# Patient Record
Sex: Female | Born: 1955
Health system: Southern US, Community
[De-identification: ages and names within clinical notes are randomized; demographics above are authoritative.]

## PROBLEM LIST (undated history)

## (undated) DIAGNOSIS — K5792 Diverticulitis of intestine, part unspecified, without perforation or abscess without bleeding: Secondary | ICD-10-CM

## (undated) DIAGNOSIS — R112 Nausea with vomiting, unspecified: Secondary | ICD-10-CM

## (undated) DIAGNOSIS — D696 Thrombocytopenia, unspecified: Secondary | ICD-10-CM

## (undated) DIAGNOSIS — M1990 Unspecified osteoarthritis, unspecified site: Secondary | ICD-10-CM

## (undated) DIAGNOSIS — Z9889 Other specified postprocedural states: Secondary | ICD-10-CM

## (undated) HISTORY — PX: TUBAL LIGATION: SHX77

## (undated) HISTORY — DX: Diverticulitis of intestine, part unspecified, without perforation or abscess without bleeding: K57.92

---

## 2007-02-10 ENCOUNTER — Ambulatory Visit: Payer: Self-pay

## 2007-11-11 ENCOUNTER — Ambulatory Visit: Payer: Self-pay | Admitting: Gastroenterology

## 2008-03-22 ENCOUNTER — Ambulatory Visit: Payer: Self-pay | Admitting: Obstetrics and Gynecology

## 2009-03-29 ENCOUNTER — Ambulatory Visit: Payer: Self-pay | Admitting: Obstetrics and Gynecology

## 2009-04-06 ENCOUNTER — Ambulatory Visit: Payer: Self-pay | Admitting: Obstetrics and Gynecology

## 2010-07-02 ENCOUNTER — Ambulatory Visit: Payer: Self-pay | Admitting: Obstetrics and Gynecology

## 2010-07-17 ENCOUNTER — Ambulatory Visit: Payer: Self-pay | Admitting: Obstetrics and Gynecology

## 2011-07-23 ENCOUNTER — Ambulatory Visit: Payer: Self-pay | Admitting: Obstetrics and Gynecology

## 2012-01-23 ENCOUNTER — Encounter: Payer: Self-pay | Admitting: Orthopedic Surgery

## 2012-02-17 ENCOUNTER — Encounter: Payer: Self-pay | Admitting: Orthopedic Surgery

## 2012-03-19 ENCOUNTER — Encounter: Payer: Self-pay | Admitting: Orthopedic Surgery

## 2012-07-28 ENCOUNTER — Ambulatory Visit: Payer: Self-pay | Admitting: Obstetrics and Gynecology

## 2012-08-07 ENCOUNTER — Ambulatory Visit: Payer: Self-pay | Admitting: Internal Medicine

## 2012-09-01 ENCOUNTER — Ambulatory Visit: Payer: Self-pay | Admitting: Internal Medicine

## 2012-09-01 LAB — RETICULOCYTES
Absolute Retic Count: 0.0517 10*6/uL (ref 0.023–0.096)
Reticulocyte: 1.2 % (ref 0.5–2.2)

## 2012-09-01 LAB — CBC CANCER CENTER
Comment - H1-Com2: NORMAL
HGB: 13.6 g/dL (ref 12.0–16.0)
MCH: 31.6 pg (ref 26.0–34.0)
Monocytes: 7 %
Platelet: 159 x10 3/mm (ref 150–440)
RBC: 4.32 10*6/uL (ref 3.80–5.20)
RDW: 12.8 % (ref 11.5–14.5)

## 2012-09-01 LAB — IRON AND TIBC
Iron Saturation: 38 %
Iron: 114 ug/dL (ref 50–170)
Unbound Iron-Bind.Cap.: 184 ug/dL

## 2012-09-01 LAB — FIBRINOGEN: Fibrinogen: 220 mg/dL (ref 210–470)

## 2012-09-01 LAB — PROTIME-INR
INR: 0.9
Prothrombin Time: 12.3 secs (ref 11.5–14.7)

## 2012-09-01 LAB — APTT: Activated PTT: 35.8 secs (ref 23.6–35.9)

## 2012-09-01 LAB — FOLATE: Folic Acid: 17.6 ng/mL (ref 3.1–100.0)

## 2012-09-01 LAB — FIBRIN DEGRADATION PROD.(ARMC ONLY): Fibrin Degradation Prod.: <10 ug/ml (ref 2.1–7.7)

## 2012-09-01 LAB — LACTATE DEHYDROGENASE: LDH: 186 U/L (ref 81–246)

## 2012-09-03 LAB — PROT IMMUNOELECTROPHORES(ARMC)

## 2012-09-16 LAB — CREATININE, SERUM
Creatinine: 0.71 mg/dL (ref 0.60–1.30)
EGFR (African American): >60

## 2012-09-19 ENCOUNTER — Ambulatory Visit: Payer: Self-pay | Admitting: Internal Medicine

## 2012-10-17 ENCOUNTER — Ambulatory Visit: Payer: Self-pay | Admitting: Internal Medicine

## 2013-04-29 DIAGNOSIS — C4491 Basal cell carcinoma of skin, unspecified: Secondary | ICD-10-CM

## 2013-04-29 HISTORY — DX: Basal cell carcinoma of skin, unspecified: C44.91

## 2013-10-28 ENCOUNTER — Ambulatory Visit: Payer: Self-pay | Admitting: Obstetrics and Gynecology

## 2013-12-09 ENCOUNTER — Ambulatory Visit: Payer: Self-pay | Admitting: Obstetrics and Gynecology

## 2014-11-23 ENCOUNTER — Encounter: Payer: Self-pay | Admitting: *Deleted

## 2015-02-08 ENCOUNTER — Other Ambulatory Visit: Payer: Self-pay | Admitting: Obstetrics and Gynecology

## 2015-02-08 DIAGNOSIS — Z1231 Encounter for screening mammogram for malignant neoplasm of breast: Secondary | ICD-10-CM

## 2015-02-27 ENCOUNTER — Other Ambulatory Visit: Payer: Self-pay | Admitting: Obstetrics and Gynecology

## 2015-02-27 ENCOUNTER — Ambulatory Visit
Admission: RE | Admit: 2015-02-27 | Discharge: 2015-02-27 | Disposition: A | Discharge status: Home / Self Care | Payer: 59 | Source: Ambulatory Visit | Attending: Obstetrics and Gynecology | Admitting: Obstetrics and Gynecology

## 2015-02-27 DIAGNOSIS — Z1231 Encounter for screening mammogram for malignant neoplasm of breast: Secondary | ICD-10-CM | POA: Insufficient documentation

## 2015-03-29 ENCOUNTER — Ambulatory Visit: Payer: 59 | Admitting: Internal Medicine

## 2015-08-20 DIAGNOSIS — K5792 Diverticulitis of intestine, part unspecified, without perforation or abscess without bleeding: Secondary | ICD-10-CM

## 2015-08-20 HISTORY — DX: Diverticulitis of intestine, part unspecified, without perforation or abscess without bleeding: K57.92

## 2015-09-20 ENCOUNTER — Ambulatory Visit: Payer: Self-pay | Admitting: Physician Assistant

## 2015-09-20 ENCOUNTER — Encounter: Payer: Self-pay | Admitting: Physician Assistant

## 2015-09-20 VITALS — BP 130/90 | HR 80 | Temp 98.4°F

## 2015-09-20 DIAGNOSIS — R110 Nausea: Secondary | ICD-10-CM

## 2015-09-20 DIAGNOSIS — R197 Diarrhea, unspecified: Secondary | ICD-10-CM

## 2015-09-20 DIAGNOSIS — N3000 Acute cystitis without hematuria: Secondary | ICD-10-CM

## 2015-09-20 DIAGNOSIS — R300 Dysuria: Secondary | ICD-10-CM

## 2015-09-20 LAB — POCT URINALYSIS DIPSTICK
GLUCOSE UA: NEGATIVE
Nitrite, UA: NEGATIVE
RBC UA: NEGATIVE
Urobilinogen, UA: 1
pH, UA: 5.5

## 2015-09-20 MED ORDER — ONDANSETRON HCL 4 MG PO TABS: 4.0000 mg | ORAL_TABLET | Freq: Three times a day (TID) | ORAL | Status: DC | PRN

## 2015-09-20 MED ORDER — CIPROFLOXACIN HCL 500 MG PO TABS: 500.0000 mg | ORAL_TABLET | Freq: Two times a day (BID) | ORAL | Status: DC

## 2015-09-20 NOTE — Progress Notes (Signed)
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

## 2015-09-22 ENCOUNTER — Telehealth: Payer: Self-pay | Admitting: Physician Assistant

## 2015-09-22 ENCOUNTER — Encounter: Payer: Self-pay | Admitting: Physician Assistant

## 2015-09-22 ENCOUNTER — Ambulatory Visit: Payer: Self-pay | Admitting: Physician Assistant

## 2015-09-22 VITALS — BP 110/70 | HR 84 | Temp 98.0°F

## 2015-09-22 DIAGNOSIS — L509 Urticaria, unspecified: Secondary | ICD-10-CM

## 2015-09-22 MED ORDER — METHYLPREDNISOLONE 4 MG PO TBPK: ORAL_TABLET | ORAL | Status: DC

## 2015-09-22 NOTE — Telephone Encounter (Signed)
Tell her I would wait a few days, it may take a week for them to clear up

## 2015-09-22 NOTE — Progress Notes (Signed)
S: hives are getting worse, abd pain and uti sx better, areas are itchy, using benadryl without relief  O: vitals wnl, nad, skin with increasing hives on legs, lungs c t a, cv rrr  A: hives  P: medrol dose pack

## 2015-09-22 NOTE — Telephone Encounter (Signed)
Patient returned call scheduled appt to be seen

## 2015-09-29 ENCOUNTER — Ambulatory Visit: Payer: Self-pay | Admitting: Physician Assistant

## 2015-09-29 ENCOUNTER — Encounter: Payer: Self-pay | Admitting: Physician Assistant

## 2015-09-29 VITALS — BP 102/74 | HR 80 | Temp 98.3°F

## 2015-09-29 DIAGNOSIS — N390 Urinary tract infection, site not specified: Secondary | ICD-10-CM

## 2015-09-29 DIAGNOSIS — R300 Dysuria: Secondary | ICD-10-CM

## 2015-09-29 LAB — POCT URINALYSIS DIPSTICK
BILIRUBIN UA: NEGATIVE
GLUCOSE UA: NEGATIVE
Ketones, UA: NEGATIVE
Nitrite, UA: NEGATIVE
Protein, UA: NEGATIVE
RBC UA: NEGATIVE
SPEC GRAV UA: 1.015
Urobilinogen, UA: 0.2
pH, UA: 6

## 2015-09-29 MED ORDER — SULFAMETHOXAZOLE-TRIMETHOPRIM 800-160 MG PO TABS
1.0000 | ORAL_TABLET | Freq: Two times a day (BID) | ORAL | Status: DC
Start: 2015-09-29 — End: 2015-10-05

## 2015-09-29 NOTE — Progress Notes (Signed)
S: c/o lbp, chills, body aches, ?if uti, no abd pain or vag d/c, just had uti treated with cipro  O: vitals wnl, nad, no cva tenderness, ua +1 leuks  A: uti  P: septra ds 1 po bid x 7d

## 2015-10-01 ENCOUNTER — Inpatient Hospital Stay
Admission: EM | Admit: 2015-10-01 | Discharge: 2015-10-05 | DRG: 392 | Disposition: A | Discharge status: Home / Self Care | Payer: 59 | Attending: Surgery | Admitting: Surgery

## 2015-10-01 ENCOUNTER — Emergency Department: Payer: 59

## 2015-10-01 ENCOUNTER — Encounter: Payer: Self-pay | Admitting: Radiology

## 2015-10-01 DIAGNOSIS — Z803 Family history of malignant neoplasm of breast: Secondary | ICD-10-CM | POA: Diagnosis not present

## 2015-10-01 DIAGNOSIS — K5720 Diverticulitis of large intestine with perforation and abscess without bleeding: Secondary | ICD-10-CM | POA: Diagnosis not present

## 2015-10-01 DIAGNOSIS — R109 Unspecified abdominal pain: Secondary | ICD-10-CM | POA: Diagnosis not present

## 2015-10-01 DIAGNOSIS — R1030 Lower abdominal pain, unspecified: Secondary | ICD-10-CM | POA: Diagnosis not present

## 2015-10-01 DIAGNOSIS — K5732 Diverticulitis of large intestine without perforation or abscess without bleeding: Secondary | ICD-10-CM | POA: Diagnosis present

## 2015-10-01 LAB — CBC
HEMATOCRIT: 40.5 % (ref 35.0–47.0)
Hemoglobin: 13.5 g/dL (ref 12.0–16.0)
MCH: 30.5 pg (ref 26.0–34.0)
MCHC: 33.4 g/dL (ref 32.0–36.0)
MCV: 91.4 fL (ref 80.0–100.0)
PLATELETS: 170 10*3/uL (ref 150–440)
RBC: 4.43 MIL/uL (ref 3.80–5.20)
RDW: 12.6 % (ref 11.5–14.5)
WBC: 8.6 10*3/uL (ref 3.6–11.0)

## 2015-10-01 LAB — URINALYSIS COMPLETE WITH MICROSCOPIC (ARMC ONLY)
BILIRUBIN URINE: NEGATIVE
GLUCOSE, UA: NEGATIVE mg/dL
Hgb urine dipstick: NEGATIVE
NITRITE: NEGATIVE
PROTEIN: NEGATIVE mg/dL
SPECIFIC GRAVITY, URINE: 1.024 (ref 1.005–1.030)
pH: 6 (ref 5.0–8.0)

## 2015-10-01 LAB — COMPREHENSIVE METABOLIC PANEL
ALBUMIN: 4 g/dL (ref 3.5–5.0)
ALK PHOS: 61 U/L (ref 38–126)
ALT: 11 U/L — AB (ref 14–54)
AST: 16 U/L (ref 15–41)
Anion gap: 8 (ref 5–15)
BILIRUBIN TOTAL: 0.7 mg/dL (ref 0.3–1.2)
BUN: 18 mg/dL (ref 6–20)
CALCIUM: 8.8 mg/dL — AB (ref 8.9–10.3)
CO2: 27 mmol/L (ref 22–32)
CREATININE: 0.76 mg/dL (ref 0.44–1.00)
Chloride: 105 mmol/L (ref 101–111)
GFR calc Af Amer: >60 mL/min (ref >60)
GLUCOSE: 104 mg/dL — AB (ref 65–99)
Potassium: 4 mmol/L (ref 3.5–5.1)
Sodium: 140 mmol/L (ref 135–145)
TOTAL PROTEIN: 7.4 g/dL (ref 6.5–8.1)

## 2015-10-01 LAB — LIPASE, BLOOD: LIPASE: 28 U/L (ref 11–51)

## 2015-10-01 MED ORDER — ERTAPENEM SODIUM 1 G IJ SOLR
1.0000 g | INTRAMUSCULAR | Status: DC
Start: 2015-10-01 — End: 2015-10-05
  Administered 2015-10-01 – 2015-10-04 (×4): 1 g via INTRAVENOUS
  Filled 2015-10-01 (×5): qty 1

## 2015-10-01 MED ORDER — IOHEXOL 300 MG/ML  SOLN
100.0000 mL | Freq: Once | INTRAMUSCULAR | Status: AC | PRN
  Administered 2015-10-01: 100 mL via INTRAVENOUS

## 2015-10-01 MED ORDER — KCL IN DEXTROSE-NACL 20-5-0.45 MEQ/L-%-% IV SOLN
INTRAVENOUS | Status: DC
  Administered 2015-10-01 – 2015-10-04 (×5): via INTRAVENOUS
  Filled 2015-10-01 (×10): qty 1000

## 2015-10-01 MED ORDER — PANTOPRAZOLE SODIUM 40 MG PO TBEC
40.0000 mg | DELAYED_RELEASE_TABLET | Freq: Two times a day (BID) | ORAL | Status: DC
  Administered 2015-10-01 – 2015-10-05 (×8): 40 mg via ORAL
  Filled 2015-10-01 (×8): qty 1

## 2015-10-01 MED ORDER — ENOXAPARIN SODIUM 40 MG/0.4ML ~~LOC~~ SOLN
40.0000 mg | SUBCUTANEOUS | Status: DC
  Administered 2015-10-01 – 2015-10-04 (×4): 40 mg via SUBCUTANEOUS
  Filled 2015-10-01 (×4): qty 0.4

## 2015-10-01 MED ORDER — ACETAMINOPHEN 650 MG RE SUPP: 650.0000 mg | Freq: Four times a day (QID) | RECTAL | Status: DC | PRN

## 2015-10-01 MED ORDER — IOHEXOL 240 MG/ML SOLN
25.0000 mL | Freq: Once | INTRAMUSCULAR | Status: AC | PRN
  Administered 2015-10-01: 25 mL via ORAL

## 2015-10-01 MED ORDER — ACETAMINOPHEN 325 MG PO TABS
650.0000 mg | ORAL_TABLET | Freq: Four times a day (QID) | ORAL | Status: DC | PRN
  Administered 2015-10-02 – 2015-10-04 (×3): 650 mg via ORAL
  Filled 2015-10-01 (×4): qty 2

## 2015-10-01 MED ORDER — ONDANSETRON HCL 4 MG/2ML IJ SOLN
4.0000 mg | Freq: Four times a day (QID) | INTRAMUSCULAR | Status: DC | PRN
  Administered 2015-10-02: 4 mg via INTRAVENOUS
  Filled 2015-10-01: qty 2

## 2015-10-01 MED ORDER — CONJ ESTROG-MEDROXYPROGEST ACE 0.45-1.5 MG PO TABS
1.0000 | ORAL_TABLET | Freq: Every day | ORAL | Status: DC
  Administered 2015-10-03 – 2015-10-05 (×3): 1 via ORAL
  Filled 2015-10-01 (×4): qty 1

## 2015-10-01 MED ORDER — HYDROMORPHONE HCL 1 MG/ML IJ SOLN
1.0000 mg | INTRAMUSCULAR | Status: DC | PRN
  Administered 2015-10-01: 1 mg via INTRAVENOUS
  Filled 2015-10-01: qty 1

## 2015-10-01 MED ORDER — ONDANSETRON 8 MG PO TBDP: 4.0000 mg | ORAL_TABLET | Freq: Four times a day (QID) | ORAL | Status: DC | PRN

## 2015-10-01 MED ORDER — HYDROCODONE-ACETAMINOPHEN 5-325 MG PO TABS: 1.0000 | ORAL_TABLET | ORAL | Status: DC | PRN

## 2015-10-01 NOTE — ED Notes (Signed)
Report given to Rebecca, RN

## 2015-10-01 NOTE — H&P (Signed)
Rebecca Watkins is a 60 y.o. female  with several week history of low back and abdominal pain.  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 She had some urinary symptoms was seen in the urgent care center at the hospital and was treated for urinary tract infection. She really did not improve. She developed a reaction with skin whelps to her antibiotic therapy and was placed on a course of steroids. She has just completed the steroids. She has had significant low back pain feeling poorly with liquid stools. She denies any fever or chills. Because of her persistent symptoms she presented to the emergency room for further evaluation.  She denies any history of significant GI problems in the past. Specifically, she denies any history of hepatitis, yellow jaundice, pancreatitis, peptic ulcer disease, gallbladder disease, or diverticulitis. She has had no surgery in the past. She does not have a primary care physician. Of note is the fact that she was diagnosed with thrombocytopenia and leukopenia several years ago evaluated the cancer center. She was under no specific therapy in the problem appears to have resolved. Her current platelet count is 170,000 white blood cell count is 8600.  Her last colonoscopy was in 2009. I do not have a copy of that report but she says it was unremarkable with no evidence of any GI problems. She denies any bleeding or change in stool habits prior to this particular episode.  CT scan demonstrated a complex multiloculated mass in the pelvis between the sigmoid colon and the uterus. It does appear to be separate from the ovaries which appeared distinct from this mass. Working diagnosis would be a uterine problem versus perforated diverticulitis. The surgical service was consulted.  History reviewed. No pertinent past medical history. History reviewed. No pertinent past surgical history. Social  History   Social History  . Marital Status: Married    Spouse Name: N/A  . Number of Children: N/A  . Years of Education: N/A   Social History Main Topics  . Smoking status: Never Smoker   . Smokeless tobacco: None  . Alcohol Use: None  . Drug Use: None  . Sexual Activity: Not Asked   Other Topics Concern  . None   Social History Narrative     Review of Systems  Constitutional: Positive for chills and malaise/fatigue. Negative for fever.  HENT: Negative.   Eyes: Negative.   Respiratory: Negative for cough, shortness of breath and wheezing.   Cardiovascular: Negative for chest pain and palpitations.  Gastrointestinal: Positive for nausea, abdominal pain and diarrhea. Negative for heartburn, vomiting and blood in stool.  Genitourinary: Positive for dysuria and urgency. Negative for flank pain.  Musculoskeletal: Positive for back pain.  Skin: Positive for itching and rash.  Neurological: Negative.   Psychiatric/Behavioral: Negative.      PHYSICAL EXAM: BP 102/47 mmHg  Pulse 94  Temp(Src) 97.4 F (36.3 C) (Oral)  Resp 16  Ht 5\' 7"  (1.702 m)  Wt 65.772 kg (145 lb)  BMI 22.71 kg/m2  SpO2 98%  Physical Exam  Constitutional: She is oriented to person, place, and time. She appears well-developed and well-nourished. No distress.  HENT:  Head: Normocephalic and atraumatic.  Eyes: EOM are normal. Pupils are equal, round, and reactive to light.  Neck: Normal range of motion. Neck supple.  Cardiovascular: Normal rate, regular rhythm and normal heart sounds.   Pulmonary/Chest: Effort normal and breath sounds  normal.  Abdominal: Soft. Bowel sounds are normal. She exhibits no distension. There is tenderness.  Musculoskeletal: She exhibits no edema or tenderness.  Neurological: She is alert and oriented to person, place, and time.  Skin: Skin is warm and dry.  Psychiatric: She has a normal mood and affect. Thought content normal.   Her abdomen is soft with minimal abdominal  tenderness no rebound and no guarding. She does have active bowel sounds.  Impression/Plan: Our working diagnosis here would be a pelvic abscess most likely secondary to perforated diverticulitis. We will plan to admit her to the hospital place her on IV antibiotics. She's been on antibiotics for a presumed urinary tract infection so I'm going to treat her with Invanz rather than Zosyn. She did have a significant allergic reaction to Cipro and she does have an allergy to erythromycin. I discussed the possibility of surgical intervention including the possibility of a temporary colostomy. She is in agreement with this plan. Her husband was present for the interview.   Dia Brousseau III, MD  10/01/2015, 9:01 PM

## 2015-10-01 NOTE — ED Notes (Signed)
Pt states that she was seen [facility] and treated for uti with lower abd and back pain since January, pt states that she was placed on cipro and hives got worse, pt stopped with cipro and started septra, pt is very nauseated with the septra, abd pain, and back pain, no formed stools, is filled with a lot of mucous. Pt has abd cramping when she has to void

## 2015-10-01 NOTE — ED Notes (Signed)
Patient transported to room 219 

## 2015-10-01 NOTE — ED Provider Notes (Signed)
[facility] Emergency Department Provider Note   ____________________________________________  Time seen: ~1805  I have reviewed the triage vital signs and the nursing notes.   HISTORY  Chief Complaint Abdominal Pain   History limited by: Not Limited   HPI Rebecca Watkins is a 60 y.o. female with no significant past medical history presents to the emergency department today because of concerns for feeling unwell, low back pain. Patient states that the symptoms have been going on for the past 2 weeks. 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 She has tried 2 different antibiotics for without any relief of her symptoms. Associated symptoms of diarrhea and mucosal bowel movements. She has had one episode of vomiting. Low-grade fevers. Patient states that the pain is located in her bilateral lower back. It is constant. She does take ibuprofen which gives a small amount of temporary relief. Denies similar symptoms in the past.     No past medical history on file.  There are no active problems to display for this patient.   No past surgical history on file.  Current Outpatient Rx  Name  Route  Sig  Dispense  Refill  . ciprofloxacin (CIPRO) 500 MG tablet   Oral   Take 1 tablet (500 mg total) by mouth 2 (two) times daily. Patient not taking: Reported on 09/22/2015   14 tablet   0   . methylPREDNISolone (MEDROL DOSEPAK) 4 MG TBPK tablet      Take 6 pills on day one then decrease by 1 pill each day Patient not taking: Reported on 09/29/2015   21 tablet   0   . ondansetron (ZOFRAN) 4 MG tablet   Oral   Take 1 tablet (4 mg total) by mouth every 8 (eight) hours as needed for nausea or vomiting.   20 tablet   0   . PREMPRO 0.45-1.5 MG tablet            3     Dispense as written.   . sulfamethoxazole-trimethoprim (BACTRIM DS,SEPTRA DS) 800-160 MG tablet   Oral   Take 1 tablet by mouth 2  (two) times daily.   14 tablet   0     Allergies Erythromycin  Family History  Problem Relation Age of Onset  . Breast cancer Mother 38  . Breast cancer Sister 39  . Breast cancer Maternal Aunt 70    Social History Social History  Substance Use Topics  . Smoking status: Never Smoker   . Smokeless tobacco: Not on file  . Alcohol Use: Not on file    Review of Systems  Constitutional: Negative for fever. Cardiovascular: Negative for chest pain. Respiratory: Negative for shortness of breath. Gastrointestinal: Positive for suprapubic pain Genitourinary: Negative for dysuria. Musculoskeletal: Positive for bilateral lower back pain Skin: Negative for rash. Neurological: Negative for headaches, focal weakness or numbness.  10-point ROS otherwise negative.  ____________________________________________   PHYSICAL EXAM:  VITAL SIGNS: ED Triage Vitals  Enc Vitals Group     BP 10/01/15 1602 109/60 mmHg     Pulse Rate 10/01/15 1602 102     Resp 10/01/15 1602 18     Temp 10/01/15 1602 97.4 F (36.3 C)     Temp Source 10/01/15 1602 Oral     SpO2 10/01/15 1602 98 %     Weight 10/01/15 1602 145 lb (65.772 kg)     Height 10/01/15 1602 5\' 7"  (1.702 m)  Head Cir --      Peak Flow --      Pain Score 10/01/15 1604 4   Constitutional: Alert and oriented. Well appearing and in no distress. Eyes: Conjunctivae are normal. PERRL. Normal extraocular movements. ENT   Head: Normocephalic and atraumatic.   Nose: No congestion/rhinnorhea.   Mouth/Throat: Mucous membranes are moist.   Neck: No stridor. Hematological/Lymphatic/Immunilogical: No cervical lymphadenopathy. Cardiovascular: Normal rate, regular rhythm.  No murmurs, rubs, or gallops. Respiratory: Normal respiratory effort without tachypnea nor retractions. Breath sounds are clear and equal bilaterally. No wheezes/rales/rhonchi. Gastrointestinal: Soft and nontender. No distention. Genitourinary:  Deferred Musculoskeletal: Normal range of motion in all extremities. No joint effusions.  No lower extremity tenderness nor edema. Neurologic:  Normal speech and language. No gross focal neurologic deficits are appreciated.  Skin:  Skin is warm, dry and intact. No rash noted. Psychiatric: Mood and affect are normal. Speech and behavior are normal. Patient exhibits appropriate insight and judgment.  ____________________________________________    LABS (pertinent positives/negatives)  Labs Reviewed  COMPREHENSIVE METABOLIC PANEL - Abnormal; Notable for the following:    Glucose, Bld 104 (*)    Calcium 8.8 (*)    ALT 11 (*)    All other components within normal limits  URINALYSIS COMPLETEWITH MICROSCOPIC ([facility] ONLY) - Abnormal; Notable for the following:    Color, Urine YELLOW (*)    APPearance HAZY (*)    Ketones, ur TRACE (*)    Leukocytes, UA 2+ (*)    Bacteria, UA RARE (*)    Squamous Epithelial / LPF 6-30 (*)    All other components within normal limits  URINE CULTURE  CBC  LIPASE, BLOOD     ____________________________________________   EKG  None  ____________________________________________    RADIOLOGY  CT abdomen and pelvis  IMPRESSION: 1. Concern of complex fluid collection or necrotic mass in the central pelvis along the posterior aspect of the uterus. Evaluation is limited by lack of contrast in the adjacent bowel, although this does not appear to reflect normal bowel. Differential considerations include a peridiverticular abscess and necrotic neoplasm. 2. No evidence of bowel obstruction or extravasated enteric contrast. The appendix appears normal. 3. The urinary tract demonstrates no acute or suspicious findings. 4. Stable hepatic cysts.   ____________________________________________   PROCEDURES  Procedure(s) performed: None  Critical Care performed: No  ____________________________________________   INITIAL IMPRESSION / ASSESSMENT  AND PLAN / ED COURSE  Pertinent labs & imaging results that were available during my care of the patient were reviewed by me and considered in my medical decision making (see chart for details).  Patient presented to the emergency department today because of concerns for continued low back pain and potential urinary tract infection. The patient blood work without any concerning findings. Mild leukocytosis on urine. Given however persistence of patient's symptoms a CT scan was performed which did show some complex fluid collection in the pelvis. Surgery was consulted who will plan on admitting the patient.  ____________________________________________   FINAL CLINICAL IMPRESSION(S) / ED DIAGNOSES  Final diagnoses:  Abdominal pain, unspecified abdominal location     Nance Pear, MD 10/01/15 2144

## 2015-10-02 ENCOUNTER — Encounter: Payer: Self-pay | Admitting: *Deleted

## 2015-10-02 LAB — CBC
HEMATOCRIT: 36.8 % (ref 35.0–47.0)
Hemoglobin: 12.4 g/dL (ref 12.0–16.0)
MCH: 31.2 pg (ref 26.0–34.0)
MCHC: 33.7 g/dL (ref 32.0–36.0)
MCV: 92.7 fL (ref 80.0–100.0)
Platelets: 135 10*3/uL — ABNORMAL LOW (ref 150–440)
RBC: 3.97 MIL/uL (ref 3.80–5.20)
RDW: 12.7 % (ref 11.5–14.5)
WBC: 8.2 10*3/uL (ref 3.6–11.0)

## 2015-10-02 LAB — BASIC METABOLIC PANEL
Anion gap: 3 — ABNORMAL LOW (ref 5–15)
BUN: 13 mg/dL (ref 6–20)
CHLORIDE: 106 mmol/L (ref 101–111)
CO2: 28 mmol/L (ref 22–32)
Calcium: 8.1 mg/dL — ABNORMAL LOW (ref 8.9–10.3)
Creatinine, Ser: 0.6 mg/dL (ref 0.44–1.00)
GFR calc Af Amer: >60 mL/min (ref >60)
GFR calc non Af Amer: >60 mL/min (ref >60)
GLUCOSE: 120 mg/dL — AB (ref 65–99)
POTASSIUM: 4 mmol/L (ref 3.5–5.1)
Sodium: 137 mmol/L (ref 135–145)

## 2015-10-02 MED ORDER — MORPHINE SULFATE (PF) 4 MG/ML IV SOLN
4.0000 mg | INTRAVENOUS | Status: DC | PRN
  Administered 2015-10-02 – 2015-10-03 (×3): 4 mg via INTRAVENOUS
  Filled 2015-10-02 (×3): qty 1

## 2015-10-02 MED ORDER — DIPHENHYDRAMINE HCL 25 MG PO CAPS
25.0000 mg | ORAL_CAPSULE | Freq: Four times a day (QID) | ORAL | Status: DC | PRN
  Administered 2015-10-02 – 2015-10-04 (×4): 25 mg via ORAL
  Filled 2015-10-02 (×4): qty 1

## 2015-10-02 MED ORDER — PROMETHAZINE HCL 25 MG/ML IJ SOLN
12.5000 mg | Freq: Four times a day (QID) | INTRAMUSCULAR | Status: DC | PRN
  Administered 2015-10-02 (×2): 12.5 mg via INTRAVENOUS
  Filled 2015-10-02 (×2): qty 1

## 2015-10-02 NOTE — Progress Notes (Signed)
Discussed patient care with Dr. Pat Patrick and have independently reviewed the CT scan.  Patient feels better this morning with no nausea or vomiting and less pain.  Vital signs are stable she is afebrile Abdomen is soft nondistended and essentially nontender.  Again CT scan and labs reviewed personally.  Agree with Dr. Rolin Barry assessment that this is likely perforated diverticulitis. We will treat with IV antibiotics and hopefully avoid acute surgical intervention needs. He will require colonoscopy at some point as she has had one in the last 6 or 7 years.

## 2015-10-02 NOTE — Progress Notes (Signed)
Subjective:   She slept last night but is persistently nauseated. She does not have any fever. Her pain is improved.  Vital signs in last 24 hours: Temp:  [97.4 F (36.3 C)-98.4 F (36.9 C)] 98.4 F (36.9 C) (02/13 0556) Pulse Rate:  [75-102] 75 (02/13 0556) Resp:  [16-18] 16 (02/13 0556) BP: (102-135)/(47-78) 105/51 mmHg (02/13 0556) SpO2:  [96 %-98 %] 97 % (02/13 0556) Weight:  [65.772 kg (145 lb)-70.489 kg (155 lb 6.4 oz)] 70.489 kg (155 lb 6.4 oz) (02/12 2154)    Intake/Output from previous day: 02/12 0701 - 02/13 0700 In: 1082 [P.O.:240; I.V.:842] Out: 400 [Urine:400]  Exam:  Her abdomen is soft with minimal suprapubic tenderness. Her back pain has been improved with narcotics.  Lab Results:  CBC  Recent Labs  10/01/15 1606 10/02/15 0517  WBC 8.6 8.2  HGB 13.5 12.4  HCT 40.5 36.8  PLT 170 135*   CMP     Component Value Date/Time   NA 137 10/02/2015 0517   K 4.0 10/02/2015 0517   CL 106 10/02/2015 0517   CO2 28 10/02/2015 0517   GLUCOSE 120* 10/02/2015 0517   BUN 13 10/02/2015 0517   CREATININE 0.60 10/02/2015 0517   CREATININE 0.71 09/16/2012 1157   CALCIUM 8.1* 10/02/2015 0517   PROT 7.4 10/01/2015 1606   ALBUMIN 4.0 10/01/2015 1606   AST 16 10/01/2015 1606   ALT 11* 10/01/2015 1606   ALKPHOS 61 10/01/2015 1606   BILITOT 0.7 10/01/2015 1606   GFRNONAA >60 10/02/2015 0517   GFRNONAA >60 09/16/2012 1157   GFRAA >60 10/02/2015 0517   GFRAA >60 09/16/2012 1157   PT/INR No results for input(s): LABPROT, INR in the last 72 hours.  Studies/Results: Ct Abdomen Pelvis W Contrast  10/01/2015  CLINICAL DATA:  Abdominal and back pain. Treated for urinary tract infection with drug reaction to antibiotics. EXAM: CT ABDOMEN AND PELVIS WITH CONTRAST TECHNIQUE: Multidetector CT imaging of the abdomen and pelvis was performed using the standard protocol following bolus administration of intravenous contrast. CONTRAST:  146mL OMNIPAQUE IOHEXOL 300 MG/ML SOLN, 74mL  OMNIPAQUE IOHEXOL 240 MG/ML SOLN COMPARISON:  Abdominal CT 09/21/2012. FINDINGS: Lower chest: Clear lung bases. No significant pleural or pericardial effusion. Hepatobiliary: There are several hepatic cysts which are unchanged. No worrisome hepatic findings. No evidence of gallstones, gallbladder wall thickening or biliary dilatation. Pancreas: Unremarkable. No pancreatic ductal dilatation or surrounding inflammatory changes. Spleen: Normal in size without focal abnormality. Adrenals/Urinary Tract: Both adrenal glands appear normal. Both kidneys demonstrate parapelvic cysts. There is no evidence of renal mass, urinary tract calculus or hydronephrosis. The bladder appears normal. Stomach/Bowel: The stomach, small bowel, appendix and proximal colon appear normal. The contrast has not yet entered the colon there is concern of a complex mass or fluid collection in the pelvis, adjacent to the sigmoid colon. This measures up to 4.5 x 3.3 cm on image 69. This lies along the posterior aspect of the uterus. There is only mild soft tissue stranding in the surrounding fat. No evidence of bowel obstruction or extravasated enteric contrast. Vascular/Lymphatic: There are no enlarged abdominal or pelvic lymph nodes. No significant vascular findings are present. Reproductive: As above, suspected complex fluid collection or mass along the posterior aspect of the uterus. The uterus itself appears normal. There is a 12 mm low-density left ovarian lesion on image number 71. I believe the right ovary is seen separate from the central pelvic process. Other: No generalized ascites or peritoneal nodularity. Musculoskeletal: No acute  or significant osseous findings. Mild lower lumbar spondylosis. IMPRESSION: 1. Concern of complex fluid collection or necrotic mass in the central pelvis along the posterior aspect of the uterus. Evaluation is limited by lack of contrast in the adjacent bowel, although this does not appear to reflect normal  bowel. Differential considerations include a peridiverticular abscess and necrotic neoplasm. 2. No evidence of bowel obstruction or extravasated enteric contrast. The appendix appears normal. 3. The urinary tract demonstrates no acute or suspicious findings. 4. Stable hepatic cysts. Electronically Signed   By: Richardean Sale M.D.   On: 10/01/2015 19:44    Assessment/Plan: Overall she's essentially the same. She did get a dose of Invanz last night. I will switch her pain medicine to morphine from [facility] and see if that will help with her nausea. Her white blood cell count is still 8200. Her platelet count is down 135,000. We will keep a close eye on that number. She does have a history of thrombocytopenia.

## 2015-10-03 DIAGNOSIS — R109 Unspecified abdominal pain: Secondary | ICD-10-CM

## 2015-10-03 LAB — CBC
HEMATOCRIT: 36.4 % (ref 35.0–47.0)
HEMOGLOBIN: 12.4 g/dL (ref 12.0–16.0)
MCH: 31.7 pg (ref 26.0–34.0)
MCHC: 34.2 g/dL (ref 32.0–36.0)
MCV: 92.6 fL (ref 80.0–100.0)
PLATELETS: 160 10*3/uL (ref 150–440)
RBC: 3.93 MIL/uL (ref 3.80–5.20)
RDW: 12.3 % (ref 11.5–14.5)
WBC: 7.5 10*3/uL (ref 3.6–11.0)

## 2015-10-03 LAB — URINE CULTURE

## 2015-10-03 NOTE — Consult Note (Signed)
GI Inpatient Consult Note  Reason for Consult:Abdominal abcess   Attending Requesting Consult:Dr. Burt Watkins  History of Present Illness: Rebecca Watkins is a 60 y.o. female with illness of a few weeks treated as urinary infection.  CT done 2 days ago showed abnormality 4.5 by 3.3  abcess or necrotic mass in central pelvis with diff of peridiverticular abcess and necrotic neoplasm.  She has been on antibiotics Invanz for 2 days and has made a little progress. T max was 99.7.  Last colonoscopy was 2009 with Dr. Allen Watkins with no abnormality.    Past Medical History:  History reviewed. No pertinent past medical history.  Problem List: Patient Active Problem List   Diagnosis Date Noted  . AP (abdominal pain)   . Diverticulitis large intestine 10/01/2015    Past Surgical History: History reviewed. No pertinent past surgical history.  Allergies: Allergies  Allergen Reactions  . Erythromycin Rash    Has patient had a PCN reaction causing immediate rash, facial/tongue/throat swelling, SOB or lightheadedness with hypotension: Yes Has patient had a PCN reaction causing severe rash involving mucus membranes or skin necrosis: No Has patient had a PCN reaction that required hospitalization No Has patient had a PCN reaction occurring within the last 10 years: No If all of the above answers are "NO", then may proceed with Cephalosporin use.    Home Medications: Prescriptions prior to admission  Medication Sig Dispense Refill Last Dose  . ondansetron (ZOFRAN) 4 MG tablet Take 1 tablet (4 mg total) by mouth every 8 (eight) hours as needed for nausea or vomiting. 20 tablet 0 10/01/2015 at Unknown time  . PREMPRO 0.45-1.5 MG tablet Take 1 tablet by mouth daily.   3 10/01/2015 at Unknown time  . sulfamethoxazole-trimethoprim (BACTRIM DS,SEPTRA DS) 800-160 MG tablet Take 1 tablet by mouth 2 (two) times daily. 14 tablet 0 09/30/2015 at Unknown time   Home medication reconciliation was completed with the patient.    Scheduled Inpatient Medications:   . enoxaparin (LOVENOX) injection  40 mg Subcutaneous Q24H  . ertapenem  1 g Intravenous Q24H  . estrogen (conjugated)-medroxyprogesterone  1 tablet Oral Daily  . pantoprazole  40 mg Oral BID    Continuous Inpatient Infusions:   . dextrose 5 % and 0.45 % NaCl with KCl 20 mEq/L 75 mL/hr at 10/03/15 1647    PRN Inpatient Medications:  acetaminophen **OR** acetaminophen, diphenhydrAMINE, HYDROcodone-acetaminophen, morphine injection, ondansetron **OR** ondansetron (ZOFRAN) IV, promethazine  Family History: family history includes Breast cancer (age of onset: 69) in her sister; Breast cancer (age of onset: 26) in her maternal aunt and mother.  The patient's family history is negative for inflammatory bowel disorders, GI malignancy, or solid organ transplantation.  Social History:   reports that she has never smoked. She does not have any smokeless tobacco history on file. She reports that she does not drink alcohol or use illicit drugs. The patient denies ETOH, tobacco, or drug use.   Review of Systems: Constitutional: Weight is stable.  Eyes: No changes in vision. ENT: No oral lesions, sore throat.  GI: see HPI.  Heme/Lymph: No easy bruising.  CV: No chest pain.  GU: No hematuria.  Integumentary: No rashes.  Neuro: No headaches.  Psych: No depression/anxiety.  Endocrine: No heat/cold intolerance.  Allergic/Immunologic: No urticaria.  Resp: No cough, SOB.  Musculoskeletal: No joint swelling.    Physical Examination: BP 103/60 mmHg  Pulse 73  Temp(Src) 98 F (36.7 C) (Oral)  Resp 16  Ht 5\' 7"  (1.702 m)  Wt 70.489 kg (155 lb 6.4 oz)  BMI 24.33 kg/m2  SpO2 98% Gen: NAD, alert and oriented x 4 HEENT: PEERLA, EOMI, Neck: supple, no JVD or thyromegaly Chest: CTA bilaterally, no wheezes, crackles, or other adventitious sounds CV: RRR, no m/g/c/r Abd: abdomen flat, non distended, No palpable mass, minimal tenderness in suprapubic/mid  lower abd. Ext: no edema, well perfused with 2+ pulses, Skin: no rash or lesions noted Lymph: no LAD  Data: Lab Results  Component Value Date   WBC 7.5 10/03/2015   HGB 12.4 10/03/2015   HCT 36.4 10/03/2015   MCV 92.6 10/03/2015   PLT 160 10/03/2015    Recent Labs Lab 10/01/15 1606 10/02/15 0517 10/03/15 0445  HGB 13.5 12.4 12.4   Lab Results  Component Value Date   NA 137 10/02/2015   K 4.0 10/02/2015   CL 106 10/02/2015   CO2 28 10/02/2015   BUN 13 10/02/2015   CREATININE 0.60 10/02/2015   Lab Results  Component Value Date   ALT 11* 10/01/2015   AST 16 10/01/2015   ALKPHOS 61 10/01/2015   BILITOT 0.7 10/01/2015   No results for input(s): APTT, INR, PTT in the last 168 hours. Assessment/Plan: Rebecca Watkins is a 60 y.o. female   Recommendations:Agree with antibiotic, will check Ca 125, CEA.  Agree with plans for several days of antibiotics and repeat CT after 5-7 days of treatment.  Consideration for future interventional radiology abcess drainage, or surgical possible laproscopic exam.  Colonoscopy likely not of significant use at this time.  Few small spots on CT (read by Dr. Gustavo Watkins)  Look like diverticuli. Will follow with you.  Thank you for the consult. Please call with questions or concerns.  Gaylyn Cheers, MD

## 2015-10-03 NOTE — Progress Notes (Signed)
CC: diverticulitis Subjective: Patient feels about the same no better or no worse still has some back pain she has no appetite and does not want to advance her diet. She is passing gas. She has small bowel movement and had an increase in pain with the bowel movement which has now abated  Objective: Vital signs in last 24 hours: Temp:  [97.3 F (36.3 C)-99.7 F (37.6 C)] 98.6 F (37 C) (02/14 0506) Pulse Rate:  [78-95] 95 (02/14 0506) Resp:  [16-20] 16 (02/14 0506) BP: (103-124)/(54-59) 103/55 mmHg (02/14 0506) SpO2:  [94 %-100 %] 94 % (02/14 0506) Last BM Date: 10/03/15  Intake/Output from previous day: 02/13 0701 - 02/14 0700 In: 2663.8 [P.O.:980; I.V.:1633.8; IV Piggyback:50] Out: 2300 [Urine:2300] Intake/Output this shift:    Physical exam:  Comfortable-appearing female patient vital signs are stable Abdomen is soft and minimally tender no peritoneal signs no guarding no rebound no percussion tenderness nontender calves  Lab Results: CBC   Recent Labs  10/02/15 0517 10/03/15 0445  WBC 8.2 7.5  HGB 12.4 12.4  HCT 36.8 36.4  PLT 135* 160   BMET  Recent Labs  10/01/15 1606 10/02/15 0517  NA 140 137  K 4.0 4.0  CL 105 106  CO2 27 28  GLUCOSE 104* 120*  BUN 18 13  CREATININE 0.76 0.60  CALCIUM 8.8* 8.1*   PT/INR No results for input(s): LABPROT, INR in the last 72 hours. ABG No results for input(s): PHART, HCO3 in the last 72 hours.  Invalid input(s): PCO2, PO2  Studies/Results: Ct Abdomen Pelvis W Contrast  10/01/2015  CLINICAL DATA:  Abdominal and back pain. Treated for urinary tract infection with drug reaction to antibiotics. EXAM: CT ABDOMEN AND PELVIS WITH CONTRAST TECHNIQUE: Multidetector CT imaging of the abdomen and pelvis was performed using the standard protocol following bolus administration of intravenous contrast. CONTRAST:  171mL OMNIPAQUE IOHEXOL 300 MG/ML SOLN, 107mL OMNIPAQUE IOHEXOL 240 MG/ML SOLN COMPARISON:  Abdominal CT 09/21/2012.  FINDINGS: Lower chest: Clear lung bases. No significant pleural or pericardial effusion. Hepatobiliary: There are several hepatic cysts which are unchanged. No worrisome hepatic findings. No evidence of gallstones, gallbladder wall thickening or biliary dilatation. Pancreas: Unremarkable. No pancreatic ductal dilatation or surrounding inflammatory changes. Spleen: Normal in size without focal abnormality. Adrenals/Urinary Tract: Both adrenal glands appear normal. Both kidneys demonstrate parapelvic cysts. There is no evidence of renal mass, urinary tract calculus or hydronephrosis. The bladder appears normal. Stomach/Bowel: The stomach, small bowel, appendix and proximal colon appear normal. The contrast has not yet entered the colon there is concern of a complex mass or fluid collection in the pelvis, adjacent to the sigmoid colon. This measures up to 4.5 x 3.3 cm on image 69. This lies along the posterior aspect of the uterus. There is only mild soft tissue stranding in the surrounding fat. No evidence of bowel obstruction or extravasated enteric contrast. Vascular/Lymphatic: There are no enlarged abdominal or pelvic lymph nodes. No significant vascular findings are present. Reproductive: As above, suspected complex fluid collection or mass along the posterior aspect of the uterus. The uterus itself appears normal. There is a 12 mm low-density left ovarian lesion on image number 71. I believe the right ovary is seen separate from the central pelvic process. Other: No generalized ascites or peritoneal nodularity. Musculoskeletal: No acute or significant osseous findings. Mild lower lumbar spondylosis. IMPRESSION: 1. Concern of complex fluid collection or necrotic mass in the central pelvis along the posterior aspect of the uterus. Evaluation is  limited by lack of contrast in the adjacent bowel, although this does not appear to reflect normal bowel. Differential considerations include a peridiverticular abscess and  necrotic neoplasm. 2. No evidence of bowel obstruction or extravasated enteric contrast. The appendix appears normal. 3. The urinary tract demonstrates no acute or suspicious findings. 4. Stable hepatic cysts. Electronically Signed   By: Richardean Sale M.D.   On: 10/01/2015 19:44    Anti-infectives: Anti-infectives    Start     Dose/Rate Route Frequency Ordered Stop   10/01/15 2100  ertapenem [facility]) 1 g in sodium chloride 0.9 % 50 mL IVPB     1 g 100 mL/hr over 30 Minutes Intravenous Every 24 hours 10/01/15 2059        Assessment/Plan:   Labs reviewed with a normal white blood cell count normal platelet count. No real improvement. May consider repeating CT scan tomorrow but for now continuing IV antibiotics and clear liquids.  Florene Glen, MD, FACS  10/03/2015

## 2015-10-04 LAB — CEA: CEA: 1 ng/mL (ref 0.0–4.7)

## 2015-10-04 MED FILL — Conjugated Estrogen-Medroxyprogest Acetate Tab 0.45-1.5 MG: ORAL | Qty: 1 | Status: AC

## 2015-10-04 MED FILL — Conjugated Estrogen-Medroxyprogest Acetate Tab 0.45-1.5 MG: ORAL | Qty: 1 | Status: CN

## 2015-10-04 NOTE — Progress Notes (Signed)
CC: Diverticulitis Subjective: Patient admitted with a history of abdominal pain and back pain with a workup showing probable perforated diverticulitis with complex mass likely an abscess in the pelvis. She is much improved today her pain is better back pain is better he has no nausea or vomiting her appetite is better and would like to advance diet. She is passing gas.  Objective: Vital signs in last 24 hours: Temp:  [97.7 F (36.5 C)-98.7 F (37.1 C)] 97.8 F (36.6 C) (02/15 1015) Pulse Rate:  [77-99] 99 (02/15 1015) Resp:  [18-20] 18 (02/15 1015) BP: (90-150)/(35-71) 90/51 mmHg (02/15 1015) SpO2:  [94 %-100 %] 99 % (02/15 1015) Last BM Date: 10/04/15  Intake/Output from previous day: 02/14 0701 - 02/15 0700 In: 1293.8 [I.V.:1293.8] Out: 1750 [Urine:1750] Intake/Output this shift: Total I/O In: 777 [P.O.:100; I.V.:677] Out: 250 [Urine:250]  Physical exam:  Abdomen is soft and minimally tender no peritoneal signs  Lab Results: CBC   Recent Labs  10/02/15 0517 10/03/15 0445  WBC 8.2 7.5  HGB 12.4 12.4  HCT 36.8 36.4  PLT 135* 160   BMET  Recent Labs  10/01/15 1606 10/02/15 0517  NA 140 137  K 4.0 4.0  CL 105 106  CO2 27 28  GLUCOSE 104* 120*  BUN 18 13  CREATININE 0.76 0.60  CALCIUM 8.8* 8.1*   PT/INR No results for input(s): LABPROT, INR in the last 72 hours. ABG No results for input(s): PHART, HCO3 in the last 72 hours.  Invalid input(s): PCO2, PO2  Studies/Results: No results found.  Anti-infectives: Anti-infectives    Start     Dose/Rate Route Frequency Ordered Stop   10/01/15 2100  ertapenem [facility]) 1 g in sodium chloride 0.9 % 50 mL IVPB     1 g 100 mL/hr over 30 Minutes Intravenous Every 24 hours 10/01/15 2059        Assessment/Plan:  Patient doing very well advance diet today to full liquids continue IV antibiotics and possibly discharge tomorrow or the next day  Florene Glen, MD, FACS  10/04/2015

## 2015-10-05 ENCOUNTER — Telehealth: Payer: Self-pay

## 2015-10-05 ENCOUNTER — Other Ambulatory Visit: Payer: Self-pay

## 2015-10-05 DIAGNOSIS — K5732 Diverticulitis of large intestine without perforation or abscess without bleeding: Secondary | ICD-10-CM

## 2015-10-05 LAB — CA 125: CA 125: 15.1 U/mL (ref 0.0–38.1)

## 2015-10-05 MED ORDER — CIPROFLOXACIN HCL 500 MG PO TABS: 500.0000 mg | ORAL_TABLET | Freq: Two times a day (BID) | ORAL | Status: DC

## 2015-10-05 MED ORDER — METRONIDAZOLE 500 MG PO TABS: 500.0000 mg | ORAL_TABLET | Freq: Three times a day (TID) | ORAL | Status: DC

## 2015-10-05 MED FILL — Conjugated Estrogen-Medroxyprogest Acetate Tab 0.45-1.5 MG: ORAL | Qty: 1 | Status: AC

## 2015-10-05 NOTE — Telephone Encounter (Signed)
Dr. Burt Knack called stating that this patient was being discharged today and he would like to see her back next Friday 10/13/2015. In addition, he wanted me to order a CT Scan Abdomen and Pelvis with contrast prior to her appointment.  I will need to call central scheduling tomorrow to schedule CT and then call patient with Dr. Antionette Char Follow Up and CT appointment.

## 2015-10-05 NOTE — Progress Notes (Signed)
Pt to be discharged per MD order. IV removed. Instructions reviewed with pt and family, all questions answered. Scripts were electronically sent. Taken out in wheelchair via Set designer.

## 2015-10-05 NOTE — Progress Notes (Signed)
CC: Acute diverticulitis Subjective: Patient feels much better today she is not taking any pain medication at this point and has had no fevers or chills no nausea or vomiting and did have a small bowel movement. As we discharged on oral medications today does not want narcotics.  Objective: Vital signs in last 24 hours: Temp:  [97.5 F (36.4 C)-98.7 F (37.1 C)] 97.5 F (36.4 C) (02/16 0526) Pulse Rate:  [57-99] 85 (02/16 0526) Resp:  [18] 18 (02/15 1330) BP: (90-114)/(51-61) 112/52 mmHg (02/16 0526) SpO2:  [93 %-100 %] 100 % (02/16 0526) Last BM Date: 10/05/15  Intake/Output from previous day: 02/15 0701 - 02/16 0700 In: 1177 [P.O.:500; I.V.:677] Out: 1100 [Urine:1100] Intake/Output this shift:    Physical exam:  Abdomen is soft and nontender much improved over prior exams. Vital signs are stable she is afebrile calves are nontender  Lab Results: CBC   Recent Labs  10/03/15 0445  WBC 7.5  HGB 12.4  HCT 36.4  PLT 160   BMET No results for input(s): NA, K, CL, CO2, GLUCOSE, BUN, CREATININE, CALCIUM in the last 72 hours. PT/INR No results for input(s): LABPROT, INR in the last 72 hours. ABG No results for input(s): PHART, HCO3 in the last 72 hours.  Invalid input(s): PCO2, PO2  Studies/Results: No results found.  Anti-infectives: Anti-infectives    Start     Dose/Rate Route Frequency Ordered Stop   10/01/15 2100  ertapenem (INVANZ) 1 g in sodium chloride 0.9 % 50 mL IVPB     1 g 100 mL/hr over 30 Minutes Intravenous Every 24 hours 10/01/15 2059        Assessment/Plan:  Patient doing very well with acute diverticulitis with likely perforation and abscess she is improved considerably on Invanz and had her last dose last night at 9 PM. Recommendations are to discharge her today in follow-up in the office next week for a CT scan and office visit after that. My office will arrange for CT. She is counseled concerning dietary as well as the need for follow-up in  the emergency room should she worsen.  Florene Glen, MD, FACS  10/05/2015

## 2015-10-05 NOTE — Discharge Summary (Signed)
Physician Discharge Summary  Patient ID: Rebecca Watkins MRN: [id number] DOB/AGE: 10/28/1955 60 y.o.  Admit date: 10/01/2015 Discharge date: 10/05/2015   Discharge Diagnoses:  Active Problems:   Diverticulitis large intestine   AP (abdominal pain)   Procedures: None  Hospital Course: This patient with acute diverticulitis and likely perforation with abscess. She has a mass in her pelvis which is complex but likely represents an abscess that is probably 3 weeks mature. She was admitted the hospital with a tentative diagnosis of acute diverticulitis and on IV antibiotic she improved considerably and is now discharged in stable condition on oral antibiotics and dietary consultation in progress. She will follow-up in my office for CT scan next week in the midweek and will have an office visit on Friday. She also had a workup with Dr. Vira Agar and a CA-19-9 and CEA were both normal.  Consults: GI  Disposition: Final discharge disposition not confirmed     Medication List    STOP taking these medications        sulfamethoxazole-trimethoprim 800-160 MG tablet  Commonly known as:  BACTRIM DS,SEPTRA DS      TAKE these medications        ciprofloxacin 500 MG tablet  Commonly known as:  CIPRO  Take 1 tablet (500 mg total) by mouth 2 (two) times daily.     metroNIDAZOLE 500 MG tablet  Commonly known as:  FLAGYL  Take 1 tablet (500 mg total) by mouth 3 (three) times daily.     ondansetron 4 MG tablet  Commonly known as:  ZOFRAN  Take 1 tablet (4 mg total) by mouth every 8 (eight) hours as needed for nausea or vomiting.     PREMPRO 0.45-1.5 MG tablet  Generic drug:  estrogen (conjugated)-medroxyprogesterone  Take 1 tablet by mouth daily.           Follow-up Information    Follow up with Phoebe Perch, MD In 10 days.   Specialty:  Surgery   Why:  follow up   Contact information:   [address] [address] [phone]       Florene Glen, MD,  FACS

## 2015-10-05 NOTE — Progress Notes (Signed)
Initial Nutrition Assessment    INTERVENTION:  Meals and snacks: Cater to pt preferences Nutrition Diet education: Discussed low fiber diet with pt and husband at bedside for the next several weeks then gradually adding high fiber foods into diet along with fluids, and exercise.  Pt verbalized understanding and expect good compliance   NUTRITION DIAGNOSIS:   Food and nutrition related knowledge deficit related to acute illness as evidenced by  (pt with questions regarding foods to eat).    GOAL:   Patient will meet greater than or equal to 90% of their needs    MONITOR:    (Energy intake, Digestive system)  REASON FOR ASSESSMENT:   Consult Assessment of nutrition requirement/status  ASSESSMENT:      Pt admitted with perforated diverticulitis with likely abscess  History reviewed. No pertinent past medical history.  Current Nutrition: tolerating full liquids  Food/Nutrition-Related History: normal intake prior to admission with decreased appetite for few days prior to admission   Scheduled Medications:  . enoxaparin (LOVENOX) injection  40 mg Subcutaneous Q24H  . ertapenem  1 g Intravenous Q24H  . estrogen (conjugated)-medroxyprogesterone  1 tablet Oral Daily  . pantoprazole  40 mg Oral BID    Continuous Medications:  . dextrose 5 % and 0.45 % NaCl with KCl 20 mEq/L 75 mL/hr at 10/04/15 0848     Electrolyte/Renal Profile and Glucose Profile:   Recent Labs Lab 10/01/15 1606 10/02/15 0517  NA 140 137  K 4.0 4.0  CL 105 106  CO2 27 28  BUN 18 13  CREATININE 0.76 0.60  CALCIUM 8.8* 8.1*  GLUCOSE 104* 120*   Protein Profile:  Recent Labs Lab 10/01/15 1606  ALBUMIN 4.0    Gastrointestinal Profile: Last BM:2/16     Weight Change: stable wt    Diet Order:  Diet full liquid Room service appropriate?: Yes; Fluid consistency:: Thin  Skin:   reviewed   Height:   Ht Readings from Last 1 Encounters:  10/01/15 5\' 7"  (1.702 m)     Weight:   Wt Readings from Last 1 Encounters:  10/01/15 155 lb 6.4 oz (70.489 kg)    Ideal Body Weight:     BMI:  Body mass index is 24.33 kg/(m^2).   EDUCATION NEEDS:   Education needs addressed  LOW Care Level  Dwana Garin B. Zenia Resides, [place], [place] (pager) Weekend/On-Call pager [phone])

## 2015-10-05 NOTE — Progress Notes (Addendum)
Pt is requesting copies of the MD notes from her stay. Medical records was called and they stated that a medical release form needed to be signed. The pt has filled out the paper and a copy was sent to medical records, other copy placed in chart.

## 2015-10-05 NOTE — Discharge Instructions (Signed)
Resume home medications and normal activity. My office will call to schedule a CT scan midweek and see Dr. Burt Knack in the office on Friday.

## 2015-10-06 NOTE — Telephone Encounter (Signed)
Called patient to let her know that her CT Scan will be on 10/12/2015 at 9:00 AM but to arrive at 8:45 AM at [facility] off of [address]. Told patient to pick up prep kit and to be NPO four hours prior to her appointment. I also told her that Dr. Burt Knack wanted to see her at our [facility] office on 10/13/2015 at 1:45 PM. Patient understood and had no further questions.

## 2015-10-10 ENCOUNTER — Telehealth: Payer: Self-pay | Admitting: Surgery

## 2015-10-10 NOTE — Telephone Encounter (Signed)
Called patient back in reference to her question. Patient stated that she was not feeling well last night after taking her antibiotic. She was nauseated and vomited. She wanted to know if she could stop taking her antibiotic. I told her not to stop taking it. However, I was able to ask our surgeon on call for a stronger medication for the nausea and vomiting. Patient stated that she will continue taking her antibiotics and Zofran and for [place] not to worry about it. I did tell patient that she continued to feel this way as how she was last night, to please give [place] a call. She understood and stated that she will come and see Dr. Burt Knack on Friday. I reminded her to go and have her CT Scan done on Thursday. She stated that she would.

## 2015-10-10 NOTE — Telephone Encounter (Signed)
Patient called and said she was feeling nauseated with the medication she is taking but now throwing up. Should she continue to take this until her next appointment?

## 2015-10-12 ENCOUNTER — Ambulatory Visit
Admission: RE | Admit: 2015-10-12 | Discharge: 2015-10-12 | Disposition: A | Discharge status: Home / Self Care | Payer: 59 | Source: Ambulatory Visit | Attending: Surgery | Admitting: Surgery

## 2015-10-12 DIAGNOSIS — N839 Noninflammatory disorder of ovary, fallopian tube and broad ligament, unspecified: Secondary | ICD-10-CM | POA: Insufficient documentation

## 2015-10-12 DIAGNOSIS — K5732 Diverticulitis of large intestine without perforation or abscess without bleeding: Secondary | ICD-10-CM | POA: Insufficient documentation

## 2015-10-12 DIAGNOSIS — K7689 Other specified diseases of liver: Secondary | ICD-10-CM | POA: Insufficient documentation

## 2015-10-12 DIAGNOSIS — K6389 Other specified diseases of intestine: Secondary | ICD-10-CM | POA: Diagnosis not present

## 2015-10-12 MED ORDER — IOHEXOL 300 MG/ML  SOLN
100.0000 mL | Freq: Once | INTRAMUSCULAR | Status: AC | PRN
  Administered 2015-10-12: 100 mL via INTRAVENOUS

## 2015-10-13 ENCOUNTER — Encounter: Payer: Self-pay | Admitting: Surgery

## 2015-10-13 ENCOUNTER — Other Ambulatory Visit: Payer: Self-pay

## 2015-10-13 ENCOUNTER — Ambulatory Visit (INDEPENDENT_AMBULATORY_CARE_PROVIDER_SITE_OTHER): Payer: 59 | Admitting: Surgery

## 2015-10-13 VITALS — BP 112/74 | HR 79 | Temp 97.8°F | Ht 67.0 in | Wt 153.6 lb

## 2015-10-13 DIAGNOSIS — K5792 Diverticulitis of intestine, part unspecified, without perforation or abscess without bleeding: Secondary | ICD-10-CM

## 2015-10-13 DIAGNOSIS — N83209 Unspecified ovarian cyst, unspecified side: Secondary | ICD-10-CM

## 2015-10-13 DIAGNOSIS — R197 Diarrhea, unspecified: Secondary | ICD-10-CM

## 2015-10-13 NOTE — Patient Instructions (Signed)
Follow up with GYN  Schedule an appt with Dr. Tiffany Kocher to set up Colonoscopy.   Follow up with our office once all these have been completed.

## 2015-10-13 NOTE — Progress Notes (Signed)
Outpatient Surgical Follow Up  10/13/2015  Rebecca Watkins is an 60 y.o. female.   CC: Acute diverticulitis  HPI: This a patient with a history of acute diverticulitis perforation she was treated in the hospital for several days on IV antibiotics and has been doing much better at home she still has some nausea and has not had a formed stool since January she has loose stools but hasn't experienced ICU RN she states that it does not have the consistency nor smell of typical C. difficile. Ears or chills.  History reviewed. No pertinent past medical history.  Past Surgical History  Procedure Laterality Date  . Tubal ligation      Family History  Problem Relation Age of Onset  . Breast cancer Mother 32  . Breast cancer Sister 80  . Breast cancer Maternal Aunt 70    Social History:  reports that she has never smoked. She has never used smokeless tobacco. She reports that she does not drink alcohol or use illicit drugs.  Allergies:  Allergies  Allergen Reactions  . Erythromycin Rash    Has patient had a PCN reaction causing immediate rash, facial/tongue/throat swelling, SOB or lightheadedness with hypotension: Yes Has patient had a PCN reaction causing severe rash involving mucus membranes or skin necrosis: No Has patient had a PCN reaction that required hospitalization No Has patient had a PCN reaction occurring within the last 10 years: No If all of the above answers are "NO", then may proceed with Cephalosporin use.    Medications reviewed.   Review of Systems:   Review of Systems  Constitutional: Negative for fever and chills.  HENT: Negative.   Eyes: Negative.   Respiratory: Negative.   Cardiovascular: Negative.   Gastrointestinal: Positive for nausea and diarrhea. Negative for heartburn, vomiting, abdominal pain, constipation, blood in stool and melena.  Genitourinary: Negative.   Musculoskeletal: Negative.   Skin: Negative.   Neurological: Negative.    Endo/Heme/Allergies: Negative.   Psychiatric/Behavioral: Negative.      Physical Exam:  BP 112/74 mmHg  Pulse 79  Temp(Src) 97.8 F (36.6 C) (Oral)  Ht 5\' 7"  (1.702 m)  Wt 153 lb 9.6 oz (69.673 kg)  BMI 24.05 kg/m2  Physical Exam  Constitutional: She is well-developed, well-nourished, and in no distress. No distress.  HENT:  Head: Normocephalic and atraumatic.  Eyes: Pupils are equal, round, and reactive to light. Right eye exhibits no discharge. Left eye exhibits no discharge.  Abdominal: Soft. She exhibits no distension. There is no tenderness. There is no rebound and no guarding.  Skin: She is not diaphoretic.  Vitals reviewed.     No results found for this or any previous visit (from the past 48 hour(s)). Ct Abdomen Pelvis W Contrast  10/12/2015  CLINICAL DATA:  Follow-up complex fluid collection in right posterior or pelvis just posterolateral to uterus EXAM: CT ABDOMEN AND PELVIS WITH CONTRAST TECHNIQUE: Multidetector CT imaging of the abdomen and pelvis was performed using the standard protocol following bolus administration of intravenous contrast. CONTRAST:  187mL OMNIPAQUE IOHEXOL 300 MG/ML  SOLN COMPARISON:  10/01/2015 FINDINGS: Sagittal images of the spine shows mild degenerative changes lumbar spine. Significant disc space flattening with mild anterior and mild posterior spurring with vacuum disc phenomenon at L5-S1 level. The lung bases are unremarkable. Minimal dependent atelectasis noted posteriorly. Enhanced liver shows no biliary ductal dilatation. Stable hepatic cysts. Enhanced pancreas, spleen and adrenal glands are unremarkable. Enhanced kidneys are symmetrical in size. No hydronephrosis or hydroureter. Delayed renal images  shows bilateral renal symmetrical excretion. Bilateral visualized proximal ureter is unremarkable. No pericecal inflammation.  Normal appendix again noted. The uterus is anteflexed. There is significant improvement in previous collection in left  posterior pelvis just posterior lateral to uterus anterior to sigmoid colon. The collection has resolved and no residual fluid is noted. There is no definite evidence of a necrotic mass. Mild congested left adnexal vessels. There is mild residual mild thickening of distal sigmoid colon just posterior to site of collection probable residual mild colitis or diverticulitis. There is mild residual scarring at the site of previous collection there is about 9 mm without evidence of acute inflammation. There is no evidence of wall enhancement. No extraluminal contrast material is noted although of the entire colon and rectum will is a well opacified with contrast. There is no evidence of extraluminal air to suggest a perforation. A low-density lesion left ovary measures 1.2 cm stable in size in appearance from prior exam. No small bowel obstruction.  No ascites or free air.  No adenopathy. IMPRESSION: 1. There is significant improvement in previous complex fluid collection in left pelvis. This probable represented pericolonic or diverticular abscess. No residual fluid collection is noted. No evidence of mass. There is minimal residual scarring at the site of collection measures about 9 mm. Mild residual thickening of distal sigmoid colon just posterior to collection probable residual mild focal colitis. There is no evidence of acute inflammation or wall enhancement. No evidence of extraluminal air or contrast material to suggest a perforation. 2. Normal appendix.  No pericecal inflammation. 3. No hydronephrosis or hydroureter. 4. Stable low-density lesion left ovary measures 1.2 cm. Mild congested left adnexal vessels. Unremarkable uterus and right ovary. 5. Stable hepatic cysts. 6. No small bowel obstruction. Electronically Signed   By: Lahoma Crocker M.D.   On: 10/12/2015 10:25    Assessment/Plan:  CT scan is personally reviewed reviewed showing much improved area in the pelvis. She does have a left ovarian cyst. She  sees Dr. Vira Agar and will get a colonoscopy scheduled for next month. She also has a gynecologist and I will order an ultrasound of the pelvis for a left ovarian mass likely a cyst just to be clear of the etiology prior to scheduling colon resection. I discussed the rationale for surgical intervention and colon resection and the options of observation as well as some of the risk of anastomotic leak and colostomy.  recurrent acute diverticulitis as the rationale for surgical intervention Questions were answered for she and her husband I will see her back after her colonoscopy and after GYN consultation and consider colon resection. I did offer laparoscopic or robotic procedures as an alternative which would necessitate or see an alternative surgeon as well and she wishes to stay with [place]. We'll check C. difficile  Florene Glen, MD, FACS

## 2015-10-14 ENCOUNTER — Other Ambulatory Visit
Admission: RE | Admit: 2015-10-14 | Discharge: 2015-10-14 | Disposition: A | Discharge status: Home / Self Care | Payer: 59 | Source: Ambulatory Visit | Attending: Surgery | Admitting: Surgery

## 2015-10-14 DIAGNOSIS — N83209 Unspecified ovarian cyst, unspecified side: Secondary | ICD-10-CM | POA: Diagnosis not present

## 2015-10-14 DIAGNOSIS — R197 Diarrhea, unspecified: Secondary | ICD-10-CM | POA: Diagnosis not present

## 2015-10-14 LAB — C DIFFICILE QUICK SCREEN W PCR REFLEX
C DIFFICILE (CDIFF) INTERP: NEGATIVE
C DIFFICILE (CDIFF) TOXIN: NEGATIVE
C DIFFICLE (CDIFF) ANTIGEN: NEGATIVE

## 2015-10-16 ENCOUNTER — Telehealth: Payer: Self-pay

## 2015-10-16 DIAGNOSIS — K5792 Diverticulitis of intestine, part unspecified, without perforation or abscess without bleeding: Secondary | ICD-10-CM

## 2015-10-16 NOTE — Telephone Encounter (Signed)
Requesting Return to work note on 10/23/15. Note written and sent to patient via mail per her request.

## 2015-10-16 NOTE — Telephone Encounter (Signed)
Radiology Department called stating that Dr. Burt Knack had ordered an ultrasound but she is needing an additional ultrasound. She gave me [id number] to place the order.

## 2015-10-19 ENCOUNTER — Ambulatory Visit
Admission: RE | Admit: 2015-10-19 | Discharge: 2015-10-19 | Disposition: A | Discharge status: Home / Self Care | Payer: 59 | Source: Ambulatory Visit | Attending: Surgery | Admitting: Surgery

## 2015-10-19 DIAGNOSIS — K5792 Diverticulitis of intestine, part unspecified, without perforation or abscess without bleeding: Secondary | ICD-10-CM | POA: Diagnosis not present

## 2015-10-19 DIAGNOSIS — N83202 Unspecified ovarian cyst, left side: Secondary | ICD-10-CM | POA: Diagnosis not present

## 2015-10-19 DIAGNOSIS — N83209 Unspecified ovarian cyst, unspecified side: Secondary | ICD-10-CM

## 2015-10-19 DIAGNOSIS — R197 Diarrhea, unspecified: Secondary | ICD-10-CM

## 2015-10-19 DIAGNOSIS — N83201 Unspecified ovarian cyst, right side: Secondary | ICD-10-CM | POA: Diagnosis not present

## 2015-10-24 ENCOUNTER — Telehealth: Payer: Self-pay

## 2015-10-24 NOTE — Telephone Encounter (Signed)
FMLA forms were filled out with updated information and faxed.

## 2015-11-01 ENCOUNTER — Ambulatory Visit (INDEPENDENT_AMBULATORY_CARE_PROVIDER_SITE_OTHER): Payer: 59 | Admitting: Family Medicine

## 2015-11-01 ENCOUNTER — Encounter: Payer: Self-pay | Admitting: Family Medicine

## 2015-11-01 VITALS — BP 112/68 | HR 71 | Temp 98.2°F | Ht 66.5 in | Wt 152.4 lb

## 2015-11-01 DIAGNOSIS — K5720 Diverticulitis of large intestine with perforation and abscess without bleeding: Secondary | ICD-10-CM

## 2015-11-01 DIAGNOSIS — Z862 Personal history of diseases of the blood and blood-forming organs and certain disorders involving the immune mechanism: Secondary | ICD-10-CM | POA: Diagnosis not present

## 2015-11-01 DIAGNOSIS — M25562 Pain in left knee: Secondary | ICD-10-CM

## 2015-11-01 DIAGNOSIS — M25561 Pain in right knee: Secondary | ICD-10-CM | POA: Insufficient documentation

## 2015-11-01 DIAGNOSIS — D696 Thrombocytopenia, unspecified: Secondary | ICD-10-CM | POA: Insufficient documentation

## 2015-11-01 NOTE — Assessment & Plan Note (Signed)
Previously evaluated by oncology. Patient reports this was felt to be her baseline. We will continue to monitor.

## 2015-11-01 NOTE — Assessment & Plan Note (Signed)
Patient with bilateral knee pain and stiffness intermittently for some time. Suspect arthritis as cause given exam. Discussed use of ibuprofen and Tylenol. Advised to take ibuprofen with food. Discussed injections into her knees would be a possibility though decided to hold off on this at this time. Patient was given strengthening exercises for her knees as well. She will continue to monitor. Given return precautions.

## 2015-11-01 NOTE — Progress Notes (Signed)
Pre visit review using our clinic review tool, if applicable. No additional management support is needed unless otherwise documented below in the visit note. 

## 2015-11-01 NOTE — Progress Notes (Signed)
Patient ID: Rebecca Watkins, female   DOB: [birth year], 60 y.o.   MRN: [place]:[id number]  Tommi Rumps, MD Phone: [phone]  Rebecca Watkins is a 60 y.o. female who presents today for new patient visit.  Bilateral knee pain: Patient notes she has had discomfort in her bilateral knees for a long time. Notes these hurt when going up stairs and when rising from seated position. Does not hurt to walk. Notes they feel stiff. No swelling. She has taken ibuprofen maybe once a day. Also tried Tylenol once a day. Notes these helped though she is afraid of overdoing the medications. She does exercise though does not do anything specific for her knees.  History of diverticulitis: Patient previously treated in the hospital. Has followed up with general surgery for this. Sees GI tomorrow to determine if she can have a colonoscopy. She notes then they are going to consider resection of part of her colon. She has no abdominal pain now. She's increased her fiber and water intake. She feels quite well.  Exercises by using the elliptical, lifting weights, and walking. Does 3 days a week. Diet is healthy with low fats, not much red meats, and large numbers of vegetables.  History of thrombocytopenia: Previously found to have low platelets. Saw oncology and had workup through them that determined there is no specific cause. Felt that her baseline platelets were low normal. No issues with bleeding.  Active Ambulatory Problems    Diagnosis Date Noted  . Diverticulitis large intestine 10/01/2015  . AP (abdominal pain)   . Bilateral knee pain 11/01/2015  . History of thrombocytopenia 11/01/2015   Resolved Ambulatory Problems    Diagnosis Date Noted  . No Resolved Ambulatory Problems   Past Medical History  Diagnosis Date  . Diverticulitis     Family History  Problem Relation Age of Onset  . Breast cancer Mother 71  . Breast cancer Sister 38  . Breast cancer Maternal Aunt 70  . Hyperlipidemia Mother   .  Hyperlipidemia Father   . Hypertension Mother   . Hypertension Father   . Diabetes Mellitus I Daughter     Social History   Social History  . Marital Status: Married    Spouse Name: N/A  . Number of Children: N/A  . Years of Education: N/A   Occupational History  . Not on file.   Social History Main Topics  . Smoking status: Never Smoker   . Smokeless tobacco: Never Used  . Alcohol Use: No  . Drug Use: No  . Sexual Activity: Not on file   Other Topics Concern  . Not on file   Social History Narrative    ROS   General:  Negative for nexplained weight loss, fever Skin: Negative for new or changing mole, sore that won't heal HEENT: Negative for trouble hearing, trouble seeing, ringing in ears, mouth sores, hoarseness, change in voice, dysphagia. CV:  Negative for chest pain, dyspnea, edema, palpitations Resp: Negative for cough, dyspnea, hemoptysis GI: Negative for nausea, vomiting, diarrhea, constipation, abdominal pain, melena, hematochezia. GU: Negative for dysuria, incontinence, urinary hesitance, hematuria, vaginal or penile discharge, polyuria, sexual difficulty, lumps in testicle or breasts MSK: Positive for joint pain, Negative for muscle cramps or aches, joint swelling Neuro: Negative for headaches, weakness, numbness, dizziness, passing out/fainting Psych: Negative for depression, anxiety, memory problems  Objective  Physical Exam Filed Vitals:   11/01/15 0831  BP: 112/68  Pulse: 71  Temp: 98.2 F (36.8 C)    BP Readings from  Last 3 Encounters:  11/01/15 112/68  10/13/15 112/74  10/05/15 112/52   Wt Readings from Last 3 Encounters:  11/01/15 152 lb 6.4 oz (69.128 kg)  10/13/15 153 lb 9.6 oz (69.673 kg)  10/01/15 155 lb 6.4 oz (70.489 kg)    Physical Exam  Constitutional: She is well-developed, well-nourished, and in no distress.  HENT:  Head: Normocephalic and atraumatic.  Right Ear: External ear normal.  Left Ear: External ear normal.    Mouth/Throat: Oropharynx is clear and moist. No oropharyngeal exudate.  Eyes: Conjunctivae are normal. Pupils are equal, round, and reactive to light.  Neck: Neck supple.  Cardiovascular: Normal rate, regular rhythm and normal heart sounds.  Exam reveals no gallop and no friction rub.   No murmur heard. Pulmonary/Chest: Effort normal and breath sounds normal. No respiratory distress. She has no wheezes. She has no rales.  Abdominal: Soft. Bowel sounds are normal. She exhibits no distension. There is no tenderness. There is no rebound and no guarding.  Musculoskeletal: She exhibits no edema.  Bilateral knees with no swelling, no joint line tenderness, no bony tenderness, no soft tissue tenderness, no ligamentous laxity, negative McMurray's, on extension of bilateral knees there is crackling heard  Lymphadenopathy:    She has no cervical adenopathy.  Neurological: She is alert. Gait normal.  Skin: Skin is warm and dry. She is not diaphoretic.  Psychiatric: Mood and affect normal.     Assessment/Plan:   Diverticulitis large intestine Benign abdominal exam today. She'll follow up with GI tomorrow. She will also follow up with general surgery to consider resection. Given return precautions.  Bilateral knee pain Patient with bilateral knee pain and stiffness intermittently for some time. Suspect arthritis as cause given exam. Discussed use of ibuprofen and Tylenol. Advised to take ibuprofen with food. Discussed injections into her knees would be a possibility though decided to hold off on this at this time. Patient was given strengthening exercises for her knees as well. She will continue to monitor. Given return precautions.  History of thrombocytopenia Previously evaluated by oncology. Patient reports this was felt to be her baseline. We will continue to monitor.    Tommi Rumps, MD [place]

## 2015-11-01 NOTE — Assessment & Plan Note (Signed)
Benign abdominal exam today. She'll follow up with GI tomorrow. She will also follow up with general surgery to consider resection. Given return precautions.

## 2015-11-01 NOTE — Patient Instructions (Signed)
Nice to meet you. You can take ibuprofen 800 mg every 8 hours as needed or Tylenol 1000 mg every 8 hours as needed for knee pain. You should continue to exercise and do some of the following exercises to help with your knee discomfort. If you develop worsening knee discomfort please let us know. If you develop abdominal pain, nausea, vomiting, fevers, any swelling, bleeding, or any new or changing symptoms please seek medical attention.  Generic Knee Exercises EXERCISES RANGE OF MOTION (ROM) AND STRETCHING EXERCISES These exercises may help you when beginning to rehabilitate your injury. Your symptoms may resolve with or without further involvement from your physician, physical therapist, or athletic trainer. While completing these exercises, remember:   Restoring tissue flexibility helps normal motion to return to the joints. This allows healthier, less painful movement and activity.  An effective stretch should be held for at least 30 seconds.  A stretch should never be painful. You should only feel a gentle lengthening or release in the stretched tissue. STRETCH - Knee Extension, Prone  Lie on your stomach on a firm surface, such as a bed or countertop. Place your right / left knee and leg just beyond the edge of the surface. You may wish to place a towel under the far end of your right / left thigh for comfort.  Relax your leg muscles and allow gravity to straighten your knee. Your clinician may advise you to add an ankle weight if more resistance is helpful for you.  You should feel a stretch in the back of your right / left knee. Hold this position for __________ seconds. Repeat __________ times. Complete this stretch __________ times per day. * Your physician, physical therapist, or athletic trainer may ask you to add ankle weight to enhance your stretch.  RANGE OF MOTION - Knee Flexion, Active  Lie on your back with both knees straight. (If this causes back discomfort, bend your  opposite knee, placing your foot flat on the floor.)  Slowly slide your heel back toward your buttocks until you feel a gentle stretch in the front of your knee or thigh.  Hold for __________ seconds. Slowly slide your heel back to the starting position. Repeat __________ times. Complete this exercise __________ times per day.  STRETCH - Quadriceps, Prone   Lie on your stomach on a firm surface, such as a bed or padded floor.  Bend your right / left knee and grasp your ankle. If you are unable to reach your ankle or pant leg, use a belt around your foot to lengthen your reach.  Gently pull your heel toward your buttocks. Your knee should not slide out to the side. You should feel a stretch in the front of your thigh and/or knee.  Hold this position for __________ seconds. Repeat __________ times. Complete this stretch __________ times per day.  STRETCH - Hamstrings, Supine   Lie on your back. Loop a belt or towel over the ball of your right / left foot.  Straighten your right / left knee and slowly pull on the belt to raise your leg. Do not allow the right / left knee to bend. Keep your opposite leg flat on the floor.  Raise the leg until you feel a gentle stretch behind your right / left knee or thigh. Hold this position for __________ seconds. Repeat __________ times. Complete this stretch __________ times per day.  STRENGTHENING EXERCISES These exercises may help you when beginning to rehabilitate your injury. They may resolve  your symptoms with or without further involvement from your physician, physical therapist, or athletic trainer. While completing these exercises, remember:   Muscles can gain both the endurance and the strength needed for everyday activities through controlled exercises.  Complete these exercises as instructed by your physician, physical therapist, or athletic trainer. Progress the resistance and repetitions only as guided.  You may experience muscle soreness  or fatigue, but the pain or discomfort you are trying to eliminate should never worsen during these exercises. If this pain does worsen, stop and make certain you are following the directions exactly. If the pain is still present after adjustments, discontinue the exercise until you can discuss the trouble with your clinician. STRENGTH - Quadriceps, Isometrics  Lie on your back with your right / left leg extended and your opposite knee bent.  Gradually tense the muscles in the front of your right / left thigh. You should see either your knee cap slide up toward your hip or increased dimpling just above the knee. This motion will push the back of the knee down toward the floor/mat/bed on which you are lying.  Hold the muscle as tight as you can without increasing your pain for __________ seconds.  Relax the muscles slowly and completely in between each repetition. Repeat __________ times. Complete this exercise __________ times per day.  STRENGTH - Quadriceps, Short Arcs   Lie on your back. Place a __________ inch towel roll under your knee so that the knee slightly bends.  Raise only your lower leg by tightening the muscles in the front of your thigh. Do not allow your thigh to rise.  Hold this position for __________ seconds. Repeat __________ times. Complete this exercise __________ times per day.  OPTIONAL ANKLE WEIGHTS: Begin with ____________________, but DO NOT exceed ____________________. Increase in 1 pound/0.5 kilogram increments.  STRENGTH - Quadriceps, Straight Leg Raises  Quality counts! Watch for signs that the quadriceps muscle is working to insure you are strengthening the correct muscles and not "cheating" by substituting with healthier muscles.  Lay on your back with your right / left leg extended and your opposite knee bent.  Tense the muscles in the front of your right / left thigh. You should see either your knee cap slide up or increased dimpling just above the knee.  Your thigh may even quiver.  Tighten these muscles even more and raise your leg 4 to 6 inches off the floor. Hold for __________ seconds.  Keeping these muscles tense, lower your leg.  Relax the muscles slowly and completely in between each repetition. Repeat __________ times. Complete this exercise __________ times per day.  STRENGTH - Hamstring, Curls  Lay on your stomach with your legs extended. (If you lay on a bed, your feet may hang over the edge.)  Tighten the muscles in the back of your thigh to bend your right / left knee up to 90 degrees. Keep your hips flat on the bed/floor.  Hold this position for __________ seconds.  Slowly lower your leg back to the starting position. Repeat __________ times. Complete this exercise __________ times per day.  OPTIONAL ANKLE WEIGHTS: Begin with ____________________, but DO NOT exceed ____________________. Increase in 1 pound/0.5 kilogram increments.  STRENGTH - Quadriceps, Squats  Stand in a door frame so that your feet and knees are in line with the frame.  Use your hands for balance, not support, on the frame.  Slowly lower your weight, bending at the hips and knees. Keep your lower legs  upright so that they are parallel with the door frame. Squat only within the range that does not increase your knee pain. Never let your hips drop below your knees.  Slowly return upright, pushing with your legs, not pulling with your hands. Repeat __________ times. Complete this exercise __________ times per day.  STRENGTH - Quadriceps, Wall Slides  Follow guidelines for form closely. Increased knee pain often results from poorly placed feet or knees.  Lean against a smooth wall or door and walk your feet out 18-24 inches. Place your feet hip-width apart.  Slowly slide down the wall or door until your knees bend __________ degrees.* Keep your knees over your heels, not your toes, and in line with your hips, not falling to either side.  Hold for  __________ seconds. Stand up to rest for __________ seconds in between each repetition. Repeat __________ times. Complete this exercise __________ times per day. * Your physician, physical therapist, or athletic trainer will alter this angle based on your symptoms and progress.   This information is not intended to replace advice given to you by your health care provider. Make sure you discuss any questions you have with your health care provider.   Document Released: 06/19/2005 Document Revised: 08/26/2014 Document Reviewed: 11/17/2008 Elsevier Interactive Patient Education [employer].

## 2015-11-02 DIAGNOSIS — K5720 Diverticulitis of large intestine with perforation and abscess without bleeding: Secondary | ICD-10-CM | POA: Diagnosis not present

## 2015-11-07 ENCOUNTER — Telehealth: Payer: Self-pay

## 2015-11-07 ENCOUNTER — Telehealth: Payer: Self-pay | Admitting: Surgery

## 2015-11-07 NOTE — Telephone Encounter (Signed)
-----   Message from Eligah East sent at 11/07/2015  8:41 AM EDT ----- I called patient to schedule her follow up with Dr Burt Knack. She states Dr Burt Knack wanted her to have a colonoscopy first. She is scheduled to have a colonoscopy with Dr Vira Agar on April 28th. Patient said once she receives the results from her colonoscopy, she will call us back and schedule an appointment with Dr Burt Knack.   ----- Message -----    From: Reece Packer, RN    Sent: 11/02/2015  12:43 PM      To: Ami R Alexander  Please call patient and see if she is ready to schedule her follow-up appointment with Dr. Burt Knack regarding her Diverticulitis. She can be seen next available appt of [facility]

## 2015-11-07 NOTE — Telephone Encounter (Signed)
I called patient to schedule her follow up with Dr Burt Knack. She states Dr Burt Knack wanted her to have a colonoscopy first. She is scheduled to have a colonoscopy with Dr Vira Agar on April 28th.  Patient said once she receives the results from her colonoscopy, she will call us back and schedule an appointment with Dr Burt Knack

## 2015-11-07 NOTE — Telephone Encounter (Signed)
Thank you for calling for an update. I was unaware of Colonoscopy date with Dr. Vira Agar but will check for notes as soon as Colonoscopy is complete.

## 2015-11-30 ENCOUNTER — Ambulatory Visit: Payer: 59 | Attending: Family Medicine

## 2015-11-30 DIAGNOSIS — M79602 Pain in left arm: Secondary | ICD-10-CM

## 2015-11-30 NOTE — Therapy (Signed)
[place] MAIN [facility] SERVICES [address] [place], [place], [address] Phone: [phone]   Fax:  [phone]  Physical Therapy Free Screen  Patient Details  Name: Rebecca Watkins MRN: [place]:[id number] Date of Birth: [birth year] No Data Recorded  Encounter Date: 11/30/2015    Past Medical History  Diagnosis Date  . Diverticulitis     Past Surgical History  Procedure Laterality Date  . Tubal ligation      There were no vitals filed for this visit.    PT/OT/SLP Screening Form   Time: in_0818_____     Time out_0840 ____   Complaint ____L Arm pain_______________ Past Medical Hx:  ________________ Injury Date:____n/A____________________________  Pain Scale: __ __________ Patient's phone [phone]  Hx (this occurrence):  Pt reports L arm pain in the mid shaft of her upper arm. She reports no specific injury. She has been taking alieve for the pain which helps. Pt denies any neck pain, no numbness/tingling. Pt reports her pain is mostly an ache.  Pt reports her pain started about 2 months ago. 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     Assessment: Pt has pain to palpation to L deltoid insertion. Pt does no have pain to RTC muscle belly or insertion points. Pt limited AROM into Flexion/abduction and end range ER. (-) empty can (-) full can (+) HK test. Upper trap substitution with over 90 deg movements. No pain into biceps tendon Poor posture  Pt symptoms do seem to be contractile in nature, but not fully consistent with L shoulder impingement. Likely deltoid, upper trap overuse/substitution from scapular and RTC weakness.     Recommendations:   Ice PRN, wall slide, posture correction  Comments:    [x]  Patient would benefit from an MD referral [x]  Patient would benefit from a full PT/OT/ SLP evaluation and treatment. []  No intervention recommended at this time.     Gorden Harms. Samiksha Pellicano, PT, DPT [phone]                                      Patient will benefit from skilled therapeutic intervention in order to improve the following deficits and impairments:     Visit Diagnosis: No diagnosis found.     Problem List Patient Active Problem List   Diagnosis Date Noted  . Bilateral knee pain 11/01/2015  . History of thrombocytopenia 11/01/2015  . AP (abdominal pain)   . Diverticulitis large intestine 10/01/2015    [name] 11/30/2015, 8:15 AM  [place] MAIN [facility] SERVICES [address] [place], [place], [address] Phone: [phone]   Fax:  [phone]  Name: [name] MRN: [place]:[id number] Date of Birth: 1955-09-22

## 2015-12-14 ENCOUNTER — Encounter: Payer: Self-pay | Admitting: *Deleted

## 2015-12-15 ENCOUNTER — Encounter
Admission: RE | Disposition: A | Discharge status: Home / Self Care | Payer: Self-pay | Source: Ambulatory Visit | Attending: Unknown Physician Specialty

## 2015-12-15 ENCOUNTER — Encounter: Payer: Self-pay | Admitting: *Deleted

## 2015-12-15 ENCOUNTER — Ambulatory Visit
Admission: RE | Admit: 2015-12-15 | Discharge: 2015-12-15 | Disposition: A | Discharge status: Home / Self Care | Payer: 59 | Source: Ambulatory Visit | Attending: Unknown Physician Specialty | Admitting: Unknown Physician Specialty

## 2015-12-15 ENCOUNTER — Ambulatory Visit: Payer: 59 | Admitting: Anesthesiology

## 2015-12-15 DIAGNOSIS — Z8349 Family history of other endocrine, nutritional and metabolic diseases: Secondary | ICD-10-CM | POA: Insufficient documentation

## 2015-12-15 DIAGNOSIS — Z833 Family history of diabetes mellitus: Secondary | ICD-10-CM | POA: Diagnosis not present

## 2015-12-15 DIAGNOSIS — Z803 Family history of malignant neoplasm of breast: Secondary | ICD-10-CM | POA: Diagnosis not present

## 2015-12-15 DIAGNOSIS — K640 First degree hemorrhoids: Secondary | ICD-10-CM | POA: Diagnosis not present

## 2015-12-15 DIAGNOSIS — Z8719 Personal history of other diseases of the digestive system: Secondary | ICD-10-CM | POA: Insufficient documentation

## 2015-12-15 DIAGNOSIS — Z79899 Other long term (current) drug therapy: Secondary | ICD-10-CM | POA: Diagnosis not present

## 2015-12-15 DIAGNOSIS — R933 Abnormal findings on diagnostic imaging of other parts of digestive tract: Secondary | ICD-10-CM | POA: Diagnosis not present

## 2015-12-15 DIAGNOSIS — Z9851 Tubal ligation status: Secondary | ICD-10-CM | POA: Insufficient documentation

## 2015-12-15 DIAGNOSIS — K648 Other hemorrhoids: Secondary | ICD-10-CM | POA: Diagnosis not present

## 2015-12-15 HISTORY — PX: COLONOSCOPY WITH PROPOFOL: SHX5780

## 2015-12-15 SURGERY — COLONOSCOPY WITH PROPOFOL
Anesthesia: General

## 2015-12-15 MED ORDER — PROPOFOL 10 MG/ML IV BOLUS: INTRAVENOUS | Status: DC | PRN

## 2015-12-15 MED ORDER — PROPOFOL 500 MG/50ML IV EMUL
INTRAVENOUS | Status: DC | PRN
  Administered 2015-12-15: 50 ug/kg/min via INTRAVENOUS

## 2015-12-15 MED ORDER — EPHEDRINE SULFATE 50 MG/ML IJ SOLN
INTRAMUSCULAR | Status: DC | PRN
  Administered 2015-12-15 (×2): 10 mg via INTRAVENOUS
  Administered 2015-12-15: 5 mg via INTRAVENOUS

## 2015-12-15 MED ORDER — PROPOFOL 10 MG/ML IV BOLUS
INTRAVENOUS | Status: DC | PRN
  Administered 2015-12-15: 30 mg via INTRAVENOUS
  Administered 2015-12-15: 10 mg via INTRAVENOUS
  Administered 2015-12-15 (×2): 30 mg via INTRAVENOUS
  Administered 2015-12-15: 10 mg via INTRAVENOUS

## 2015-12-15 MED ORDER — MIDAZOLAM HCL 5 MG/5ML IJ SOLN
INTRAMUSCULAR | Status: DC | PRN
  Administered 2015-12-15 (×2): 1 mg via INTRAVENOUS

## 2015-12-15 MED ORDER — PHENYLEPHRINE HCL 10 MG/ML IJ SOLN
INTRAMUSCULAR | Status: DC | PRN
  Administered 2015-12-15 (×2): 100 ug via INTRAVENOUS

## 2015-12-15 MED ORDER — FENTANYL CITRATE (PF) 100 MCG/2ML IJ SOLN
INTRAMUSCULAR | Status: DC | PRN
  Administered 2015-12-15 (×2): 50 ug via INTRAVENOUS

## 2015-12-15 MED ORDER — LIDOCAINE HCL (PF) 2 % IJ SOLN
INTRAMUSCULAR | Status: DC | PRN
  Administered 2015-12-15: 50 mg

## 2015-12-15 MED ORDER — SODIUM CHLORIDE 0.9 % IV SOLN
INTRAVENOUS | Status: DC
  Administered 2015-12-15: 1000 mL via INTRAVENOUS

## 2015-12-15 MED ORDER — SODIUM CHLORIDE 0.9 % IV SOLN
INTRAVENOUS | Status: DC
Start: 2015-12-15 — End: 2015-12-17

## 2015-12-15 NOTE — Anesthesia Postprocedure Evaluation (Signed)
Anesthesia Post Note  Patient: Rebecca Watkins  Procedure(s) Performed: Procedure(s) (LRB): COLONOSCOPY WITH PROPOFOL (N/A)  Patient location during evaluation: Endoscopy Anesthesia Type: General Level of consciousness: awake and alert Pain management: pain level controlled Vital Signs Assessment: post-procedure vital signs reviewed and stable Respiratory status: spontaneous breathing, nonlabored ventilation, respiratory function stable and patient connected to nasal cannula oxygen Cardiovascular status: blood pressure returned to baseline and stable Postop Assessment: no signs of nausea or vomiting Anesthetic complications: no    Last Vitals:  Filed Vitals:   12/15/15 1440 12/15/15 1450  BP: 110/65 107/56  Pulse: 79 73  Temp:    Resp: 15 14    Last Pain: There were no vitals filed for this visit.               Martha Clan

## 2015-12-15 NOTE — H&P (Signed)
   Primary Care Physician:  Tommi Rumps, MD Primary Gastroenterologist:  Dr. Vira Agar  Pre-Procedure History & Physical: HPI:  Rebecca Watkins is a 60 y.o. female is here for an colonoscopy.   Past Medical History  Diagnosis Date  . Diverticulitis     Past Surgical History  Procedure Laterality Date  . Tubal ligation      Prior to Admission medications   Medication Sig Start Date End Date Taking? Authorizing Provider  PREMPRO 0.45-1.5 MG tablet Take 1 tablet by mouth daily.  09/07/15   Historical Provider, MD    Allergies as of 12/06/2015 - Review Complete 11/01/2015  Allergen Reaction Noted  . Erythromycin Rash 02/27/2015    Family History  Problem Relation Age of Onset  . Breast cancer Mother 34  . Breast cancer Sister 93  . Breast cancer Maternal Aunt 70  . Hyperlipidemia Mother   . Hyperlipidemia Father   . Hypertension Mother   . Hypertension Father   . Diabetes Mellitus I Daughter     Social History   Social History  . Marital Status: Married    Spouse Name: N/A  . Number of Children: N/A  . Years of Education: N/A   Occupational History  . Not on file.   Social History Main Topics  . Smoking status: Never Smoker   . Smokeless tobacco: Never Used  . Alcohol Use: No  . Drug Use: No  . Sexual Activity: Not on file   Other Topics Concern  . Not on file   Social History Narrative    Review of Systems: See HPI, otherwise negative ROS  Physical Exam: Pulse 88  Temp(Src) 97.9 F (36.6 C) (Tympanic)  Resp 16  Ht 5\' 7"  (1.702 m)  Wt 63.957 kg (141 lb)  BMI 22.08 kg/m2  SpO2 99% General:   Alert,  pleasant and cooperative in NAD Head:  Normocephalic and atraumatic. Neck:  Supple; no masses or thyromegaly. Lungs:  Clear throughout to auscultation.    Heart:  Regular rate and rhythm. Abdomen:  Soft, nontender and nondistended. Normal bowel sounds, without guarding, and without rebound.   Neurologic:  Alert and  oriented x4;  grossly normal  neurologically.  Impression/Plan: Rebecca Watkins is here for an colonoscopy to be performed for evaluate diverticulitis/diverticulosis  Risks, benefits, limitations, and alternatives regarding  colonoscopy have been reviewed with the patient.  Questions have been answered.  All parties agreeable.   Gaylyn Cheers, MD  12/15/2015, 1:33 PM

## 2015-12-15 NOTE — Transfer of Care (Signed)
Immediate Anesthesia Transfer of Care Note  Patient: Rebecca Watkins  Procedure(s) Performed: Procedure(s): COLONOSCOPY WITH PROPOFOL (N/A)  Patient Location: PACU  Anesthesia Type:General  Level of Consciousness: sedated  Airway & Oxygen Therapy: Patient Spontanous Breathing and Patient connected to nasal cannula oxygen  Post-op Assessment: Report given to RN and Post -op Vital signs reviewed and stable  Post vital signs: Reviewed and stable  Last Vitals:  Filed Vitals:   12/15/15 1224  Pulse: 88  Temp: 36.6 C  Resp: 16    Last Pain: There were no vitals filed for this visit.       Complications: No apparent anesthesia complications

## 2015-12-15 NOTE — Anesthesia Preprocedure Evaluation (Signed)
Anesthesia Evaluation  Patient identified by MRN, date of birth, ID band Patient awake    Reviewed: Allergy & Precautions, H&P , NPO status , Patient's Chart, lab work & pertinent test results, reviewed documented beta blocker date and time   History of Anesthesia Complications Negative for: history of anesthetic complications  Airway Mallampati: II  TM Distance: >3 FB Neck ROM: full    Dental no notable dental hx. (+) Caps, Teeth Intact   Pulmonary neg pulmonary ROS,    Pulmonary exam normal breath sounds clear to auscultation       Cardiovascular Exercise Tolerance: Good negative cardio ROS Normal cardiovascular exam Rhythm:regular Rate:Normal     Neuro/Psych negative neurological ROS  negative psych ROS   GI/Hepatic negative GI ROS, Neg liver ROS,   Endo/Other  negative endocrine ROS  Renal/GU negative Renal ROS  negative genitourinary   Musculoskeletal   Abdominal   Peds  Hematology negative hematology ROS (+)   Anesthesia Other Findings Past Medical History:   Diverticulitis                                               Reproductive/Obstetrics negative OB ROS                             Anesthesia Physical Anesthesia Plan  ASA: I  Anesthesia Plan: General   Post-op Pain Management:    Induction:   Airway Management Planned:   Additional Equipment:   Intra-op Plan:   Post-operative Plan:   Informed Consent: I have reviewed the patients History and Physical, chart, labs and discussed the procedure including the risks, benefits and alternatives for the proposed anesthesia with the patient or authorized representative who has indicated his/her understanding and acceptance.   Dental Advisory Given  Plan Discussed with: Anesthesiologist, CRNA and Surgeon  Anesthesia Plan Comments:         Anesthesia Quick Evaluation

## 2015-12-15 NOTE — Op Note (Signed)
[facility] Gastroenterology Patient Name: Rebecca Watkins Procedure Date: 12/15/2015 1:33 PM MRN: [id number] Account #: [id number] Date of Birth: [birth year] Admit Type: Outpatient Age: 60 Room: [facility] ENDO ROOM 1 Gender: Female Note Status: Finalized Procedure:            Colonoscopy Indications:          Abnormal CT of the GI tract, Follow-up of acute                        ischemic colitis Providers:            Manya Silvas, MD Referring MD:         Randall Hiss g. Caryl Bis (Referring MD) Medicines:            Propofol per Anesthesia Complications:        No immediate complications. Procedure:            Pre-Anesthesia Assessment:                       - After reviewing the risks and benefits, the patient                        was deemed in satisfactory condition to undergo the                        procedure.                       After obtaining informed consent, the colonoscope was                        passed under direct vision. Throughout the procedure,                        the patient's blood pressure, pulse, and oxygen                        saturations were monitored continuously. The                        Colonoscope was introduced through the anus and                        advanced to the the cecum, identified by appendiceal                        orifice and ileocecal valve. The colonoscopy was                        performed without difficulty. The patient tolerated the                        procedure well. The quality of the bowel preparation                        was excellent. Findings:      Internal hemorrhoids were found during endoscopy. The hemorrhoids were       small and Grade I (internal hemorrhoids that do not prolapse).      The entire examined colon appeared normal. The colon was examined twice,       Once  routine to cecum with slow withdrawal and again with repeat       insertion to proximal ascending colon. No diverrticli  were seen, No       colitis, no polyps. Impression:           - Internal hemorrhoids.                       Likely illness was ischemic colitis or infectious                        colitis.                       - The entire examined colon is normal.                       - No specimens collected. Recommendation:       - The findings and recommendations were discussed with                        the patient's family. No surgery recommended. Manya Silvas, MD 12/15/2015 2:16:18 PM This report has been signed electronically. Number of Addenda: 0 Note Initiated On: 12/15/2015 1:33 PM Scope Withdrawal Time: 0 hours 18 minutes 47 seconds  Total Procedure Duration: 0 hours 32 minutes 19 seconds       [facility]

## 2015-12-18 ENCOUNTER — Other Ambulatory Visit: Payer: Self-pay

## 2015-12-18 ENCOUNTER — Encounter: Payer: Self-pay | Admitting: Unknown Physician Specialty

## 2015-12-22 ENCOUNTER — Ambulatory Visit: Payer: 59 | Admitting: Surgery

## 2015-12-27 DIAGNOSIS — M7502 Adhesive capsulitis of left shoulder: Secondary | ICD-10-CM | POA: Diagnosis not present

## 2015-12-27 DIAGNOSIS — M79622 Pain in left upper arm: Secondary | ICD-10-CM | POA: Diagnosis not present

## 2016-01-10 ENCOUNTER — Encounter: Payer: Self-pay | Admitting: Physical Therapy

## 2016-01-10 ENCOUNTER — Ambulatory Visit: Payer: 59 | Attending: Orthopedic Surgery | Admitting: Physical Therapy

## 2016-01-10 DIAGNOSIS — M79602 Pain in left arm: Secondary | ICD-10-CM

## 2016-01-10 NOTE — Therapy (Signed)
[place] MAIN [facility] SERVICES [address] [place], [place], [address] Phone: [phone]   Fax:  [phone]  Physical Therapy Evaluation  Patient Details  Name: Rebecca Watkins MRN: [id number] Date of Birth: [birth year] Referring Provider: Hessie Knows  Encounter Date: 01/10/2016      PT End of Session - 01/10/16 1729    PT Start Time 0455   PT Stop Time 0530   PT Time Calculation (min) 35 min      Past Medical History  Diagnosis Date  . Diverticulitis     Past Surgical History  Procedure Laterality Date  . Tubal ligation    . Colonoscopy with propofol N/A 12/15/2015    Procedure: COLONOSCOPY WITH PROPOFOL;  Surgeon: Manya Silvas, MD;  Location: [facility] ENDOSCOPY;  Service: Endoscopy;  Laterality: N/A;    There were no vitals filed for this visit.       Subjective Assessment - 01/10/16 1700    Subjective Patient is having left arm pain and she is not able to move her arm behind her back or lie on her side. Patient has had it for 3 months and it got worse.   Pertinent History Right shoulder frozen 3 years ago   Diagnostic tests  x - ray   Patient Stated Goals to be able to use her left arm and exercise.    Currently in Pain? Yes   Pain Score 3    Pain Location Shoulder   Pain Orientation Left   Pain Descriptors / Indicators Aching            [facility] PT Assessment - 01/10/16 0001    Assessment   Medical Diagnosis left shoulder frozen   Referring Provider MENZ, MICHAEL   Onset Date/Surgical Date 09/20/15   Hand Dominance Right   Prior Therapy yes   Precautions   Precautions None   Restrictions   Weight Bearing Restrictions No   Balance Screen   Has the patient fallen in the past 6 months No   Has the patient had a decrease in activity level because of a fear of falling?  Yes   Is the patient reluctant to leave their home because of a fear of falling?  No   Home Ecologist  residence   Living Arrangements Spouse/significant other   Available Help at Discharge Family   Type of [place] to enter   Entrance Stairs-Number of Steps [place] One level   [place] None   Prior Function   Level of [place] Needs assistance with ADLs   Vocation Full time employment   Engineer, mining   Leisure travel, read, gym   Cognition   Overall Cognitive Status Within Functional Limits for tasks assessed      PAIN: 3/10 left arm  POSTURE: WFL STRENGTH LUE is 2/5 shoulder 4/5 elbow /wrist/hand  Accessory movements: Thoracic spine hypomobile T 1- T12 Left shoulder hypomobile   Palpation: tenderness to palpation left  scapula musculature   PROM/AROM:  Muscle Group Left Right  Shoulder flex 90/90 WFL  Shoulder Abd 85/85 WFL  Shoulder Ext 20/20 WFL  Shoulder IR/ER 55/0 WFL  Elbow WFL WFL  Wrist/hand [facility]  SENSATION: intact SPECIAL TESTS: unable to get into position for special tests   FUNCTIONAL MOBILITY: guarded supine to prone     OUTCOME MEASURES: TEST Outcome Interpretation                                                       PT Long Term Goals - 01/10/16 1733    PT LONG TERM GOAL #1   Title Patient will be able to perform household work/ chores without increase in symptoms.   Time 12   Period Weeks   Status New   PT LONG TERM GOAL #2   Title Patient will increase BLE gross strength to 4+/5 as to improve functional strength for independent gait, increased standing tolerance and increased ADL ability.   Time 12   Period Weeks   Status New   PT LONG TERM GOAL #3   Title Patient will report a worst pain of 3/10 on VAS in left shoulder to improve tolerance with ADLs and reduced symptoms with activities.    Time 12   Period Weeks   Status New   PT LONG TERM GOAL #4   Title Patient  will decrease Quick DASH score by > 8 points demonstrating reduced self-reported upper extremity disability   Time 12   Period Weeks   Status New               Plan - 01/10/16 1729    Clinical Impression Statement Patient has decreased L shoulder ROM and strength and has constant pain that ranges from 3/10-8/10. She has tenderness to thoracic spine with + spring test T 1-8.    Rehab Potential Good   PT Frequency 2x / week   PT Duration 12 weeks   PT Treatment/Interventions Cryotherapy;Electrical Stimulation;Moist Heat;Ultrasound;Therapeutic exercise;Therapeutic activities;Manual techniques;Passive range of motion;Dry needling   PT Next Visit Plan manual therapy to thoracic spine and left shoulder   PT Home Exercise Plan pulley   Consulted and Agree with Plan of Care Patient      Patient will benefit from skilled therapeutic intervention in order to improve the following deficits and impairments:  Pain, Decreased strength, Decreased range of motion, Impaired flexibility, Postural dysfunction  Visit Diagnosis: Pain In Left Arm     Problem List Patient Active Problem List   Diagnosis Date Noted  . Bilateral knee pain 11/01/2015  . History of thrombocytopenia 11/01/2015  . AP (abdominal pain)   . Diverticulitis large intestine 10/01/2015   Alanson Puls, PT, DPT [place], Minette Headland S 01/10/2016, 5:43 PM  [place] MAIN [facility] SERVICES [address] [place], [place], [address] Phone: [phone]   Fax:  [phone]  Name: Rebecca Watkins MRN: [id number] Date of Birth: [birth year]

## 2016-01-16 ENCOUNTER — Ambulatory Visit: Payer: 59 | Admitting: Physical Therapy

## 2016-01-17 ENCOUNTER — Ambulatory Visit: Payer: 59 | Admitting: Physical Therapy

## 2016-01-17 DIAGNOSIS — M79602 Pain in left arm: Secondary | ICD-10-CM

## 2016-01-18 ENCOUNTER — Encounter: Payer: Self-pay | Admitting: Physical Therapy

## 2016-01-18 ENCOUNTER — Ambulatory Visit: Payer: 59 | Admitting: Physical Therapy

## 2016-01-18 NOTE — Therapy (Signed)
[place] MAIN [facility] SERVICES [address] [place], [place], [address] Phone: [phone]   Fax:  [phone]  Physical Therapy Treatment  Patient Details  Name: Soliyana Clotfelter MRN: [id number] Date of Birth: [birth year] Referring Provider: Hessie Knows  Encounter Date: 01/17/2016      PT End of Session - 01/18/16 0837    Visit Number 2   Number of Visits 17   PT Start Time [id number]   PT Stop Time 0625   PT Time Calculation (min) 30 min      Past Medical History  Diagnosis Date  . Diverticulitis     Past Surgical History  Procedure Laterality Date  . Tubal ligation    . Colonoscopy with propofol N/A 12/15/2015    Procedure: COLONOSCOPY WITH PROPOFOL;  Surgeon: Manya Silvas, MD;  Location: [facility] ENDOSCOPY;  Service: Endoscopy;  Laterality: N/A;    There were no vitals filed for this visit.      Subjective Assessment - 01/18/16 0837    Subjective Patient is having left arm pain and she is not able to move her arm behind her back or lie on her side. Patient has had it for 3 months and it got worse.   Pertinent History Right shoulder frozen 3 years ago   Diagnostic tests  x - ray   Patient Stated Goals to be able to use her left arm and exercise.     Manual therapy:  Inferior glides left shoulder grade 2 and 3 with PROM 75, 90, 95 deg   Thoracic PA glides grade 3 T1- T 12 x 30 bouts x 3   Patient has improved AROM following manual therapy and  Continues to have pain in mid ranges of AROM.                             PT Education - 01/18/16 0837    Education provided Yes   Education Details HEP   Person(s) Educated Patient   Methods Explanation   Comprehension Verbalized understanding             PT Long Term Goals - 01/10/16 1733    PT LONG TERM GOAL #1   Title Patient will be able to perform household work/ chores without increase in symptoms.   Time 12   Period Weeks   Status New    PT LONG TERM GOAL #2   Title Patient will increase BLE gross strength to 4+/5 as to improve functional strength for independent gait, increased standing tolerance and increased ADL ability.   Time 12   Period Weeks   Status New   PT LONG TERM GOAL #3   Title Patient will report a worst pain of 3/10 on VAS in left shoulder to improve tolerance with ADLs and reduced symptoms with activities.    Time 12   Period Weeks   Status New   PT LONG TERM GOAL #4   Title Patient will decrease Quick DASH score by > 8 points demonstrating reduced self-reported upper extremity disability   Time 12   Period Weeks   Status New               Plan - 01/18/16 [id number]    Clinical Impression Statement Patient has 80 deg Left shoulder flex and abd prior to manual therapy and 95 deg following therapy.    Rehab Potential Good   PT Frequency 2x / week  PT Duration 12 weeks   PT Treatment/Interventions Cryotherapy;Electrical Stimulation;Moist Heat;Ultrasound;Therapeutic exercise;Therapeutic activities;Manual techniques;Passive range of motion;Dry needling   PT Next Visit Plan manual therapy to thoracic spine and left shoulder   PT Home Exercise Plan pulley   Consulted and Agree with Plan of Care Patient      Patient will benefit from skilled therapeutic intervention in order to improve the following deficits and impairments:  Pain, Decreased strength, Decreased range of motion, Impaired flexibility, Postural dysfunction  Visit Diagnosis: Pain In Left Arm     Problem List Patient Active Problem List   Diagnosis Date Noted  . Bilateral knee pain 11/01/2015  . History of thrombocytopenia 11/01/2015  . AP (abdominal pain)   . Diverticulitis large intestine 10/01/2015   Alanson Puls, PT, DPT [place], Minette Headland S 01/18/2016, 8:40 AM  [place] MAIN [facility] SERVICES [address] [place], [place], [address] Phone: [phone]   Fax:   [phone]  Name: Yena Deets MRN: [id number] Date of Birth: February 18, 1956

## 2016-01-23 ENCOUNTER — Encounter: Payer: Self-pay | Admitting: Physical Therapy

## 2016-01-23 ENCOUNTER — Ambulatory Visit: Payer: 59 | Attending: Orthopedic Surgery | Admitting: Physical Therapy

## 2016-01-23 DIAGNOSIS — M79602 Pain in left arm: Secondary | ICD-10-CM | POA: Diagnosis not present

## 2016-01-23 NOTE — Therapy (Signed)
[place] MAIN [facility] SERVICES [address] [place], [place], [address] Phone: [phone]   Fax:  [phone]  Physical Therapy Treatment  Patient Details  Name: Rebecca Watkins MRN: [id number] Date of Birth: 28-May-1956 Referring Provider: Hessie Knows  Encounter Date: 01/23/2016      PT End of Session - 01/23/16 0925    Visit Number 2   Number of Visits 17   PT Start Time 0835   PT Stop Time 0915   PT Time Calculation (min) 40 min   Activity Tolerance Patient tolerated treatment well;Patient limited by pain;No increased pain   Behavior During Therapy [facility] for tasks assessed/performed      Past Medical History  Diagnosis Date  . Diverticulitis     Past Surgical History  Procedure Laterality Date  . Tubal ligation    . Colonoscopy with propofol N/A 12/15/2015    Procedure: COLONOSCOPY WITH PROPOFOL;  Surgeon: Manya Silvas, MD;  Location: [facility] ENDOSCOPY;  Service: Endoscopy;  Laterality: N/A;    There were no vitals filed for this visit.      Subjective Assessment - 01/23/16 0924    Subjective Patient is having left arm pain and she is not able to move her arm behind her back or lie on her side. Patient has had it for 3 months and it got worse.Patient says that her arm / shoulder fetl much better after the last treatment.   Pertinent History Right shoulder frozen 3 years ago   Diagnostic tests  x - ray   Patient Stated Goals to be able to use her left arm and exercise.       Manual therapy: Thoracic mobs T1-T12 PA glides grade 3 x 30 bouts Left shoulder inferior glide grade 3 shoulder abd 30-90 deg for 30 bouts Left shoulder posterior glide grade 3 x 30 bouts PROM in ER/IR and shoulder flex x 10  Patient has pain during treatment session and has increased AROM following manual therapy                           PT Education - 01/23/16 0925    Education provided Yes   Education Details HEP   Person(s) Educated Patient   Methods Explanation   Comprehension Verbalized understanding             PT Long Term Goals - 01/10/16 1733    PT LONG TERM GOAL #1   Title Patient will be able to perform household work/ chores without increase in symptoms.   Time 12   Period Weeks   Status New   PT LONG TERM GOAL #2   Title Patient will increase BLE gross strength to 4+/5 as to improve functional strength for independent gait, increased standing tolerance and increased ADL ability.   Time 12   Period Weeks   Status New   PT LONG TERM GOAL #3   Title Patient will report a worst pain of 3/10 on VAS in left shoulder to improve tolerance with ADLs and reduced symptoms with activities.    Time 12   Period Weeks   Status New   PT LONG TERM GOAL #4   Title Patient will decrease Quick DASH score by > 8 points demonstrating reduced self-reported upper extremity disability   Time 12   Period Weeks   Status New               Plan - 01/23/16 [id number]  Clinical Impression Statement Patient has 90 deg left shoulder abd and flex prior to manual therapy and 95 deg following therapy.    Rehab Potential Good   PT Frequency 2x / week   PT Duration 12 weeks   PT Treatment/Interventions Cryotherapy;Electrical Stimulation;Moist Heat;Ultrasound;Therapeutic exercise;Therapeutic activities;Manual techniques;Passive range of motion;Dry needling   PT Next Visit Plan manual therapy to thoracic spine and left shoulder   PT Home Exercise Plan pulley   Consulted and Agree with Plan of Care Patient      Patient will benefit from skilled therapeutic intervention in order to improve the following deficits and impairments:  Pain, Decreased strength, Decreased range of motion, Impaired flexibility, Postural dysfunction  Visit Diagnosis: Pain In Left Arm     Problem List Patient Active Problem List   Diagnosis Date Noted  . Bilateral knee pain 11/01/2015  . History of thrombocytopenia  11/01/2015  . AP (abdominal pain)   . Diverticulitis large intestine 10/01/2015  Alanson Puls, PT, DPT  [place], Minette Headland S 01/23/2016, 9:32 AM  [place] MAIN [facility] SERVICES [address] [place], [place], [address] Phone: [phone]   Fax:  [phone]  Name: Rebecca Watkins MRN: [id number] Date of Birth: Dec 30, 1955

## 2016-01-24 ENCOUNTER — Ambulatory Visit: Payer: 59

## 2016-01-29 ENCOUNTER — Ambulatory Visit: Payer: 59 | Admitting: Physical Therapy

## 2016-01-29 ENCOUNTER — Encounter: Payer: Self-pay | Admitting: Physical Therapy

## 2016-01-29 DIAGNOSIS — M79602 Pain in left arm: Secondary | ICD-10-CM | POA: Diagnosis not present

## 2016-01-29 NOTE — Therapy (Signed)
[place] MAIN [facility] SERVICES [address] [place], [place], [address] Phone: [phone]   Fax:  [phone]  Physical Therapy Treatment  Patient Details  Name: Rebecca Watkins MRN: [id number] Date of Birth: June 20, 1956 Referring Provider: Hessie Knows  Encounter Date: 01/29/2016      PT End of Session - 01/29/16 1332    Visit Number 4   Number of Visits 17   PT Start Time 1218   PT Stop Time 1300   PT Time Calculation (min) 42 min   Activity Tolerance Patient tolerated treatment well;Patient limited by pain;No increased pain   Behavior During Therapy [facility] for tasks assessed/performed      Past Medical History  Diagnosis Date  . Diverticulitis     Past Surgical History  Procedure Laterality Date  . Tubal ligation    . Colonoscopy with propofol N/A 12/15/2015    Procedure: COLONOSCOPY WITH PROPOFOL;  Surgeon: Manya Silvas, MD;  Location: [facility] ENDOSCOPY;  Service: Endoscopy;  Laterality: N/A;    There were no vitals filed for this visit.      Subjective Assessment - 01/29/16 1217    Subjective (p) Pt reports her symptoms are the same. She continues to have difficulty reaching behind her back. IR most difficult.    Pertinent History (p) Right shoulder frozen 3 years ago   Diagnostic tests (p)  x - ray   Patient Stated Goals (p) to be able to use her left arm and exercise.    Currently in Pain? (p) Yes   Pain Score (p) 6    Pain Location (p) Shoulder   Pain Orientation (p) Left   Pain Descriptors / Indicators (p) Aching   Pain Type (p) Chronic pain   Pain Onset (p) More than a month ago      Manual therapy:  L shoulder distraction mobs grade III 3x30 Inferior glides left shoulder grade III and IV in 70 and 90 deg flexion 3x30 each Inferior  glides L shoulder grade III and IV with 70 deg abduction 3x30  Thoracic PA glides grade 3 T1- T 7 x 30 bouts x 3   L scapular STM x10 minutes, primarily to medial and inferior  border for reduced soft tissue tightness and improved shoulder mechanics L scapular abduction/upward rotation mobilization grade III and IV 3x30 for improved scapular mechanics and reduced pain with ROM  Patient has improved AROM following manual therapy and continues to have pain in mid ranges of AROM, however improved. Pt responded well to scapular STM and mobs.  Therex: L shoulder IR towel stretch behind the back 10 x5 seconds. Added to HEP for continued ROM.                           PT Education - 01/29/16 1331    Education provided Yes   Education Details added to [employer]) Educated Patient   Methods Explanation;Demonstration;Handout   Comprehension Verbalized understanding;Returned demonstration             PT Long Term Goals - 01/10/16 1733    PT LONG TERM GOAL #1   Title Patient will be able to perform household work/ chores without increase in symptoms.   Time 12   Period Weeks   Status New   PT LONG TERM GOAL #2   Title Patient will increase BLE gross strength to 4+/5 as to improve functional strength for independent gait, increased standing tolerance  and increased ADL ability.   Time 12   Period Weeks   Status New   PT LONG TERM GOAL #3   Title Patient will report a worst pain of 3/10 on VAS in left shoulder to improve tolerance with ADLs and reduced symptoms with activities.    Time 12   Period Weeks   Status New   PT LONG TERM GOAL #4   Title Patient will decrease Quick DASH score by > 8 points demonstrating reduced self-reported upper extremity disability   Time 12   Period Weeks   Status New               Plan - 01/29/16 1333    Clinical Impression Statement Pt continues to have limited shoulder flexion, abduction, and IR which limit her ADLs and work duties. PT assessed L scapular mobility, with pt demonstrating hypomobility of scapular abduction/ upward rotation and soft tissue tightness on medial border.  After [facility]  manual treatment and scapular mobilization, pt demonstrated improved ROM and reduced pain with movement. Pt responded well to scapular soft tissue massage and mobilizations.   Rehab Potential Good   PT Frequency 2x / week   PT Duration 12 weeks   PT Treatment/Interventions Cryotherapy;Electrical Stimulation;Moist Heat;Ultrasound;Therapeutic exercise;Therapeutic activities;Manual techniques;Passive range of motion;Dry needling   PT Next Visit Plan manual therapy to thoracic spine and left shoulder   PT Home Exercise Plan pulley, towel IR stretch   Consulted and Agree with Plan of Care Patient      Patient will benefit from skilled therapeutic intervention in order to improve the following deficits and impairments:  Pain, Decreased strength, Decreased range of motion, Impaired flexibility, Postural dysfunction  Visit Diagnosis: Pain In Left Arm     Problem List Patient Active Problem List   Diagnosis Date Noted  . Bilateral knee pain 11/01/2015  . History of thrombocytopenia 11/01/2015  . AP (abdominal pain)   . Diverticulitis large intestine 10/01/2015    Rebecca Watkins, PT, DPT  01/29/2016, 2:28 PM [place] MAIN [facility] SERVICES [address] [place], [place], [address] Phone: [phone]   Fax:  [phone]  Name: Rebecca Watkins MRN: [id number] Date of Birth: September 23, 1955

## 2016-01-29 NOTE — Patient Instructions (Signed)
HEP: L shoulder IR towel stretch behind back 10 x 10 seconds, 2 x per day. Created on [email or web address]

## 2016-01-30 ENCOUNTER — Ambulatory Visit: Payer: 59

## 2016-01-31 ENCOUNTER — Ambulatory Visit: Payer: 59

## 2016-01-31 ENCOUNTER — Encounter: Payer: Self-pay | Admitting: Physical Therapy

## 2016-01-31 ENCOUNTER — Ambulatory Visit: Payer: 59 | Admitting: Physical Therapy

## 2016-01-31 DIAGNOSIS — M79602 Pain in left arm: Secondary | ICD-10-CM | POA: Diagnosis not present

## 2016-01-31 NOTE — Therapy (Signed)
[place] MAIN [facility] SERVICES [address] [place], [place], [address] Phone: [phone]   Fax:  [phone]  Physical Therapy Treatment  Patient Details  Name: Rebecca Watkins MRN: [id number] Date of Birth: 1956/03/15 Referring Provider: Hessie Knows  Encounter Date: 01/31/2016      PT End of Session - 01/31/16 1528    Visit Number 5   Number of Visits 17   Date for PT Re-Evaluation 03/06/16   PT Start Time [id number]   PT Stop Time [id number]   PT Time Calculation (min) 45 min   Activity Tolerance Patient tolerated treatment well;No increased pain   Behavior During Therapy [facility] for tasks assessed/performed      Past Medical History  Diagnosis Date  . Diverticulitis     Past Surgical History  Procedure Laterality Date  . Tubal ligation    . Colonoscopy with propofol N/A 12/15/2015    Procedure: COLONOSCOPY WITH PROPOFOL;  Surgeon: Manya Silvas, MD;  Location: [facility] ENDOSCOPY;  Service: Endoscopy;  Laterality: N/A;    There were no vitals filed for this visit.      Subjective Assessment - 01/31/16 1456    Subjective Pt reports she has more range since last session. 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   Pertinent History Right shoulder frozen 3 years ago   Diagnostic tests  x - ray   Patient Stated Goals to be able to use her left arm and exercise.    Currently in Pain? No/denies   Pain Onset More than a month ago      Manual therapy:  Posterior mobs L shoulder (~90 degrees flexion) grade III and IV 3x30 sec  L scapular STM x10 minutes, primarily to medial and inferior border for reduced soft tissue tightness and improved shoulder mechanics L scapular abduction/upward rotation mobilization grade III and IV 3x30 for improved scapular mechanics and reduced pain with ROM  Patient has improved AROM following manual therapy and continues to have pain in mid ranges of  AROM, however improved. Pt responded well to scapular STM and mobs.  AAROM L shoulder flexion 112 degrees, abduction 96 degrees  Therex: L shoulder IR towel stretch behind the back 10 x5 seconds. Added to HEP for continued ROM.  Supine cane flexion 2x10 Supine cane abduction 2x10 Supine cane ER 2x10 Corner stretch 5 x10 seconds Sleeper stretch 10x10 seconds L shoulder  Cues for proper exercises technique for proper GH positioning and targeting appropriate muscles. Cues for reduced shoulder shrug.                           PT Education - 01/31/16 1528    Education Details additional HEP, see pt instructions   Person(s) Educated Patient   Methods Explanation;Demonstration;Handout   Comprehension Verbalized understanding;Returned demonstration             PT Long Term Goals - 01/10/16 1733    PT LONG TERM GOAL #1   Title Patient will be able to perform household work/ chores without increase in symptoms.   Time 12   Period Weeks   Status New   PT LONG TERM GOAL #2   Title Patient will increase BLE gross strength to 4+/5 as to improve functional strength for independent gait, increased standing tolerance and increased ADL ability.   Time 12   Period Weeks   Status New   PT  LONG TERM GOAL #3   Title Patient will report a worst pain of 3/10 on VAS in left shoulder to improve tolerance with ADLs and reduced symptoms with activities.    Time 12   Period Weeks   Status New   PT LONG TERM GOAL #4   Title Patient will decrease Quick DASH score by > 8 points demonstrating reduced self-reported upper extremity disability   Time 12   Period Weeks   Status New               Plan - 01/31/16 1530    Clinical Impression Statement Pt has made significant progress in the past 2 sessions in pain and ROM. She has been able to perform more ADLs with less pain. L shoulder flexion increased to 112 degrees and abduction 96 degrees. She will benefit from  continued scapular STM, joint mobs and ROM exercises to acheive improved ROM.   Rehab Potential Good   PT Frequency 2x / week   PT Duration 12 weeks   PT Treatment/Interventions Cryotherapy;Electrical Stimulation;Moist Heat;Ultrasound;Therapeutic exercise;Therapeutic activities;Manual techniques;Passive range of motion;Dry needling   PT Next Visit Plan manual therapy to thoracic spine and left shoulder   PT Home Exercise Plan pulley, towel IR stretch, supine flexion, abd, ER wand, corner stretch, sleeper stretch   Consulted and Agree with Plan of Care Patient      Patient will benefit from skilled therapeutic intervention in order to improve the following deficits and impairments:  Pain, Decreased strength, Decreased range of motion, Impaired flexibility, Postural dysfunction  Visit Diagnosis: Pain In Left Arm     Problem List Patient Active Problem List   Diagnosis Date Noted  . Bilateral knee pain 11/01/2015  . History of thrombocytopenia 11/01/2015  . AP (abdominal pain)   . Diverticulitis large intestine 10/01/2015    Melinda Gwinner Shiela Mayer, PT, DPT  01/31/2016, 3:39 PM [place] MAIN [facility] SERVICES [address] [place], [place], [address] Phone: [phone]   Fax:  [phone]  Name: Rebecca Watkins MRN: [id number] Date of Birth: 1956/03/04

## 2016-01-31 NOTE — Patient Instructions (Signed)
Provided HEP: supine wand flexion, ER, and abd, sleeper stretch, IR towel stretch. Created on [email or web address].

## 2016-02-01 ENCOUNTER — Ambulatory Visit: Payer: 59

## 2016-02-02 ENCOUNTER — Ambulatory Visit: Payer: 59 | Admitting: Physical Therapy

## 2016-02-07 ENCOUNTER — Encounter: Payer: Self-pay | Admitting: Physical Therapy

## 2016-02-14 ENCOUNTER — Ambulatory Visit: Payer: 59 | Admitting: Physical Therapy

## 2016-02-14 ENCOUNTER — Encounter: Payer: Self-pay | Admitting: Physical Therapy

## 2016-02-14 DIAGNOSIS — M79602 Pain in left arm: Secondary | ICD-10-CM | POA: Diagnosis not present

## 2016-02-14 NOTE — Therapy (Signed)
[place] MAIN [facility] SERVICES [address] [place], [place], [address] Phone: [phone]   Fax:  [phone]  Physical Therapy Treatment  Patient Details  Name: Rebecca Watkins MRN: [id number] Date of Birth: 28-Aug-1955 Referring Provider: Hessie Knows  Encounter Date: 02/14/2016      PT End of Session - 02/14/16 1446    Visit Number 6   Number of Visits 17   Date for PT Re-Evaluation 04/03/16   PT Start Time [id number]   PT Stop Time 1430   PT Time Calculation (min) 45 min   Activity Tolerance Patient tolerated treatment well;No increased pain   Behavior During Therapy [facility] for tasks assessed/performed      Past Medical History  Diagnosis Date  . Diverticulitis     Past Surgical History  Procedure Laterality Date  . Tubal ligation    . Colonoscopy with propofol N/A 12/15/2015    Procedure: COLONOSCOPY WITH PROPOFOL;  Surgeon: Manya Silvas, MD;  Location: [facility] ENDOSCOPY;  Service: Endoscopy;  Laterality: N/A;    There were no vitals filed for this visit.      Subjective Assessment - 02/14/16 1351    Subjective Patient reports overall less pain in LUE however still has stiffness and difficulty tying isolation gowns at work; She reports no pain at rest; She reports being able to sleep some on left side which is an improvement; She is trying some of her exercises at home but they are so painful and limiting; She doesn't have a TENs unit at home and has not used it since left shoulder started hurting;    Pertinent History Right shoulder frozen 3 years ago   Diagnostic tests  x - ray   Patient Stated Goals to be able to use her left arm and exercise.    Currently in Pain? No/denies   Pain Onset More than a month ago       Pre treatment shoulder AROM: Flexion: 95 degrees Abduction: 65 degrees IR at 90 degrees abduction: 40 degrees ER at 90 degrees abduction: 18 degrees    TREATMENT: Patient prone: PT performed grade III-IV   PA mobs to spinous process at T1, T2, T3, T4, T5, T6, 20 sec bouts x3 each level; Soft/deep tissue massage along bilateral thoracic paraspinals x3 min; Patient in right sidelying; PT performed LUE scapular mobilization with movement, AROM into shoulder abduction with scapular abduction x10 reps; PT performed myofascial release to left scapular in right sidelying x3-5 min;  Patient supine: PT performed PROM of LUE shoulder flexion/abduction and gentle distraction x2 min; Patient reports no significant improvement in shoulder movement with PROM;  Patient in sitting: Mobilization with movement with mobilization belt, LUE shoulder flexion/abduction 2 sets of 10 each with cues to increase ROM to tolerance; Patient reports less discomfort with mobilization with movement as compared to PROM;  Educated patient in HEP: Seated scapular retraction x10 with cues to avoid shoulder elevation; Seated thoracic extension with shoulder flexion over bolster 2 sets of 10 with cues to avoid painful ROM but to increase stretch to tolerance; Re-inforced importance of working on shoulder flexion/abduction AAROM with wall and table slides;     Patient's shoulder ROM following treatment session:      [facility] PT Assessment - 02/14/16 1425    AROM   Left Shoulder Flexion 110 Degrees   Left Shoulder ABduction 78 Degrees     Finished with LUE shoulder moist heat x5 min concurrent with HEP instruction;  PT Education - 02/14/16 1446    Education provided Yes   Education Details HEP advanced, posture, shoulder positioning   Person(s) Educated Patient   Methods Explanation;Verbal cues;Handout   Comprehension Verbalized understanding;Returned demonstration;Verbal cues required             PT Long Term Goals - 01/10/16 1733    PT LONG TERM GOAL #1   Title Patient will be able to perform household work/ chores without increase in symptoms.   Time 12   Period Weeks    Status New   PT LONG TERM GOAL #2   Title Patient will increase BLE gross strength to 4+/5 as to improve functional strength for independent gait, increased standing tolerance and increased ADL ability.   Time 12   Period Weeks   Status New   PT LONG TERM GOAL #3   Title Patient will report a worst pain of 3/10 on VAS in left shoulder to improve tolerance with ADLs and reduced symptoms with activities.    Time 12   Period Weeks   Status New   PT LONG TERM GOAL #4   Title Patient will decrease Quick DASH score by > 8 points demonstrating reduced self-reported upper extremity disability   Time 12   Period Weeks   Status New               Plan - 02/14/16 1447    Clinical Impression Statement Patient continues to have pain in LUE with overhead reach; she does respond well to manual therapy; Patient exhibits less stiffness following thoracic joint mobs. She also exhibits improved scapular movement following myofascial release. Patient tolerated mobilizations with movement well with less discomfort. She demonstrates improved shoulder ROM following manual therapy (see attached); However patient reports increased stiffness within 1-2 days following therapy sessions; Advanced HEP with scapular retraction and postural strengthening exercise to reduce risk for shoulder impingement. Also educated patient on importance of HEP compliance with shoulder AAROM to faciliate better shoulder flexibility; she would benefit from additional skilled PT intervention to improve shoulder ROM and reduce pain with ADLs.    Rehab Potential Good   PT Frequency 2x / week   PT Duration 12 weeks   PT Treatment/Interventions Cryotherapy;Electrical Stimulation;Moist Heat;Ultrasound;Therapeutic exercise;Therapeutic activities;Manual techniques;Passive range of motion;Dry needling   PT Next Visit Plan manual therapy; work on mobilization with movement; TENs;    PT Home Exercise Plan see patient instructions    Consulted and Agree with Plan of Care Patient      Patient will benefit from skilled therapeutic intervention in order to improve the following deficits and impairments:  Pain, Decreased strength, Decreased range of motion, Impaired flexibility, Postural dysfunction  Visit Diagnosis: Pain In Left Arm     Problem List Patient Active Problem List   Diagnosis Date Noted  . Bilateral knee pain 11/01/2015  . History of thrombocytopenia 11/01/2015  . AP (abdominal pain)   . Diverticulitis large intestine 10/01/2015    Crystalynn Mcinerney PT, DPT 02/14/2016, 3:10 PM  [place] MAIN [facility] SERVICES [address] [place], [place], [address] Phone: [phone]   Fax:  [phone]  Name: Rebecca Watkins MRN: [id number] Date of Birth: [birth year]

## 2016-02-14 NOTE — Patient Instructions (Signed)
Scapular Retraction (Standing)    With arms at sides, pinch shoulder blades together. Be sure to keep elbows down and avoid lifting shoulders up; Repeat _10___ times per set. Do _2___ sets per session. Do _5___ sessions per day.  http://orth.exer.us/945   Copyright  VHI. All rights reserved.  (Clinic) Extension: Thoracic With Lumbar Lock - Sitting    Sit with back against chair where back comes up to your mid back if possible. Cross arms across chest and arch back over edge of chair with elbows lifting up to tolerance.  Repeat __10__ times per set. Do __2__ sets per session. Do __5__ sessions per week.  Copyright  VHI. All rights reserved.

## 2016-02-19 ENCOUNTER — Encounter: Payer: Self-pay | Admitting: Physical Therapy

## 2016-02-21 ENCOUNTER — Encounter: Payer: Self-pay | Admitting: Physical Therapy

## 2016-02-21 ENCOUNTER — Ambulatory Visit: Payer: 59 | Attending: Orthopedic Surgery | Admitting: Physical Therapy

## 2016-02-21 DIAGNOSIS — M79602 Pain in left arm: Secondary | ICD-10-CM | POA: Diagnosis not present

## 2016-02-21 NOTE — Therapy (Signed)
[place] MAIN [facility] SERVICES [address] [place], [place], [address] Phone: [phone]   Fax:  [phone]  Physical Therapy Treatment  Patient Details  Name: Rebecca Watkins MRN: [id number] Date of Birth: 27-May-1956 Referring Provider: Hessie Knows  Encounter Date: 02/21/2016      PT End of Session - 02/21/16 1542    Visit Number 7   Number of Visits 17   Date for PT Re-Evaluation 04/03/16   PT Start Time 1440   PT Stop Time 1525   PT Time Calculation (min) 45 min   Activity Tolerance Patient tolerated treatment well;No increased pain   Behavior During Therapy [facility] for tasks assessed/performed      Past Medical History  Diagnosis Date  . Diverticulitis     Past Surgical History  Procedure Laterality Date  . Tubal ligation    . Colonoscopy with propofol N/A 12/15/2015    Procedure: COLONOSCOPY WITH PROPOFOL;  Surgeon: Manya Silvas, MD;  Location: [facility] ENDOSCOPY;  Service: Endoscopy;  Laterality: N/A;    There were no vitals filed for this visit.      Subjective Assessment - 02/21/16 1537    Subjective Patient reports continued LUE pain; denies any significant changes; reports trying to do exercises regularly at home without improvement;    Pertinent History Right shoulder frozen 3 years ago   Diagnostic tests  x - ray   Patient Stated Goals to be able to use her left arm and exercise.    Currently in Pain? Yes   Pain Score 3    Pain Location Shoulder   Pain Orientation Left   Pain Descriptors / Indicators Aching;Sore   Pain Type Chronic pain   Pain Onset More than a month ago         TREATMENT: Pre treatment shoulder AROM: Flexion: 95 degrees Abduction: 70 degrees (no significant change from last session) TREATMENT: Patient prone: PT performed grade III-IV PA mobs to spinous process at T1, T2, T3, T4, T5, T6, 10 sec bouts x3 each level; Soft/deep tissue massage along bilateral thoracic paraspinals x3  min; Patient reports pain with LUE shoulder extension AROM; LUE shoulder rows x10 AROM with cues to increase scapular retraction and reduce shoulder elevation for better scapular strength and to reduce shoulder pain;  Patient in right sidelying; LUE shoulder ER AROM 1# 2x10 with cues to keep elbow by side and avoid trunk rotation to isolate better glenohumeral joint movement;  Patient supine: LUE AAROM 2# wand chest press x10; LUE AAROM 2# wand shoulder flexion to tolerance x10 reps (able to achieve LUE shoulder AAROM to 125 degrees)   Patient in sitting: Mobilization with movement with mobilization belt, LUE shoulder flexion/abduction 2 sets of 10 each with cues to increase ROM to tolerance; Patient reports less discomfort with mobilization with movement as compared to PROM;  Standing; LUE press down into table with walking backwards AAROM shoulder flexion x10 reps; patient reports minimal discomfort but is unable to achieve >115 degrees LUE shoulder flexion with AAROM; BUE wall slides "V" partial ROM x5 reps with mod tactile cues to reduce LUE shoulder elevation and to avoid painful ROM; Doorway stretch with arms at side 15 sec hold x2;  PT advanced HEP with written HEP instruction; see patient instructions;  Finished with moist heat to LUE shoulder in supine x5 min(unbilled);                         PT  Education - 02/21/16 1542    Education provided Yes   Education Details shoulder ROM/HEP advanced,    Person(s) Educated Patient   Methods Explanation;Verbal cues;Handout   Comprehension Verbalized understanding;Returned demonstration;Verbal cues required             PT Long Term Goals - 01/10/16 1733    PT LONG TERM GOAL #1   Title Patient will be able to perform household work/ chores without increase in symptoms.   Time 12   Period Weeks   Status New   PT LONG TERM GOAL #2   Title Patient will increase BLE gross strength to 4+/5 as to improve  functional strength for independent gait, increased standing tolerance and increased ADL ability.   Time 12   Period Weeks   Status New   PT LONG TERM GOAL #3   Title Patient will report a worst pain of 3/10 on VAS in left shoulder to improve tolerance with ADLs and reduced symptoms with activities.    Time 12   Period Weeks   Status New   PT LONG TERM GOAL #4   Title Patient will decrease Quick DASH score by > 8 points demonstrating reduced self-reported upper extremity disability   Time 12   Period Weeks   Status New               Plan - 02/21/16 1543    Clinical Impression Statement Patient exhibits limited shoulder ROM at start of session with limited carryover from last session; She required min VCs for correct exercise technique and to avoid shoulder elevation during flexion/abduction exercise. Patient continues to have pain along left lateral UE below joint line. She demonstrates better scapular mobility but is still limited with shoulder ROM. Patient would benefit from additional skilled PT intervention to improve shoulder ROM and reduce pain with ADLs.    Rehab Potential Good   PT Frequency 2x / week   PT Duration 12 weeks   PT Treatment/Interventions Cryotherapy;Electrical Stimulation;Moist Heat;Ultrasound;Therapeutic exercise;Therapeutic activities;Manual techniques;Passive range of motion;Dry needling   PT Next Visit Plan manual therapy; work on mobilization with movement; TENs;    PT Home Exercise Plan see patient instructions   Consulted and Agree with Plan of Care Patient      Patient will benefit from skilled therapeutic intervention in order to improve the following deficits and impairments:  Pain, Decreased strength, Decreased range of motion, Impaired flexibility, Postural dysfunction  Visit Diagnosis: Pain In Left Arm     Problem List Patient Active Problem List   Diagnosis Date Noted  . Bilateral knee pain 11/01/2015  . History of thrombocytopenia  11/01/2015  . AP (abdominal pain)   . Diverticulitis large intestine 10/01/2015    Trotter,Margaret PT, DPT 02/21/2016, 4:39 PM  [place] MAIN [facility] SERVICES [address] [place], [place], [address] Phone: [phone]   Fax:  [phone]  Name: Ayrica Connick MRN: [id number] Date of Birth: Jan 12, 1956

## 2016-02-21 NOTE — Patient Instructions (Signed)
External Rotation (Side-Lying)    Lie on left side, holding _1_ pound weight (can/water bottle) in top hand, elbow bent 90, towel roll between arm and body. Keeping elbow bent at side, rotate forearm upward. Repeat _10_ times. Repeat with other hand for set. Rest _15_ seconds after set. Do _2_ sets per session.  Copyright  VHI. All rights reserved.  Flexion (Passive)    Standing, push hand down into table/counter and walk backwards until you feel a stretch in shoulder. Hold 2-3____ seconds. Repeat _10___ times. Do __2__ sessions per day.  Copyright  VHI. All rights reserved.  Wall Slide With Cervical Neutral / Upward Scapular Rotation: Standing    Elbows bent, with forearm on wall, Slide arm up against wall, keeping shoulder down (not elevating shoulder). Only go as far as is comfortable without pain.  Do _10__ times, __2_ times per day.  http://ss.exer.us/225   Copyright  VHI. All rights reserved.  CHEST: Doorway, Bilateral - Standing    Standing in doorway, place hands on wall with arms down by side. Lean forward. Hold _15__ seconds. _2__ reps per set, _2__ sets per day, __5_ days per week  Copyright  VHI. All rights reserved.

## 2016-02-23 ENCOUNTER — Ambulatory Visit: Payer: 59 | Admitting: Physical Therapy

## 2016-02-27 ENCOUNTER — Encounter: Payer: Self-pay | Admitting: Physical Therapy

## 2016-02-29 ENCOUNTER — Encounter: Payer: Self-pay | Admitting: Physical Therapy

## 2016-02-29 ENCOUNTER — Ambulatory Visit: Payer: 59 | Admitting: Physical Therapy

## 2016-04-29 ENCOUNTER — Other Ambulatory Visit: Payer: Self-pay | Admitting: Obstetrics and Gynecology

## 2016-04-29 DIAGNOSIS — N941 Unspecified dyspareunia: Secondary | ICD-10-CM | POA: Diagnosis not present

## 2016-04-29 DIAGNOSIS — Z1231 Encounter for screening mammogram for malignant neoplasm of breast: Secondary | ICD-10-CM | POA: Diagnosis not present

## 2016-04-29 DIAGNOSIS — Z01419 Encounter for gynecological examination (general) (routine) without abnormal findings: Secondary | ICD-10-CM | POA: Diagnosis not present

## 2016-04-29 DIAGNOSIS — Z1211 Encounter for screening for malignant neoplasm of colon: Secondary | ICD-10-CM | POA: Diagnosis not present

## 2016-05-03 DIAGNOSIS — Z01419 Encounter for gynecological examination (general) (routine) without abnormal findings: Secondary | ICD-10-CM | POA: Diagnosis not present

## 2016-06-05 ENCOUNTER — Other Ambulatory Visit: Payer: Self-pay | Admitting: Obstetrics and Gynecology

## 2016-06-05 ENCOUNTER — Ambulatory Visit
Admission: RE | Admit: 2016-06-05 | Discharge: 2016-06-05 | Disposition: A | Discharge status: Home / Self Care | Payer: 59 | Source: Ambulatory Visit | Attending: Obstetrics and Gynecology | Admitting: Obstetrics and Gynecology

## 2016-06-05 DIAGNOSIS — Z1231 Encounter for screening mammogram for malignant neoplasm of breast: Secondary | ICD-10-CM

## 2016-06-05 DIAGNOSIS — R928 Other abnormal and inconclusive findings on diagnostic imaging of breast: Secondary | ICD-10-CM | POA: Diagnosis not present

## 2016-06-11 ENCOUNTER — Other Ambulatory Visit: Payer: Self-pay | Admitting: Obstetrics and Gynecology

## 2016-06-11 DIAGNOSIS — R928 Other abnormal and inconclusive findings on diagnostic imaging of breast: Secondary | ICD-10-CM

## 2016-06-25 ENCOUNTER — Ambulatory Visit
Admission: RE | Admit: 2016-06-25 | Discharge: 2016-06-25 | Disposition: A | Discharge status: Home / Self Care | Payer: 59 | Source: Ambulatory Visit | Attending: Obstetrics and Gynecology | Admitting: Obstetrics and Gynecology

## 2016-06-25 DIAGNOSIS — R928 Other abnormal and inconclusive findings on diagnostic imaging of breast: Secondary | ICD-10-CM

## 2016-06-25 DIAGNOSIS — N6324 Unspecified lump in the left breast, lower inner quadrant: Secondary | ICD-10-CM | POA: Diagnosis not present

## 2016-06-25 DIAGNOSIS — N6321 Unspecified lump in the left breast, upper outer quadrant: Secondary | ICD-10-CM | POA: Diagnosis not present

## 2016-06-25 DIAGNOSIS — N6002 Solitary cyst of left breast: Secondary | ICD-10-CM | POA: Diagnosis not present

## 2016-08-06 ENCOUNTER — Other Ambulatory Visit: Payer: Self-pay | Admitting: Obstetrics and Gynecology

## 2016-08-06 DIAGNOSIS — N6002 Solitary cyst of left breast: Secondary | ICD-10-CM

## 2016-09-30 ENCOUNTER — Emergency Department: Payer: PRIVATE HEALTH INSURANCE

## 2016-09-30 ENCOUNTER — Emergency Department
Admission: EM | Admit: 2016-09-30 | Discharge: 2016-09-30 | Disposition: A | Discharge status: Home / Self Care | Payer: PRIVATE HEALTH INSURANCE | Attending: Emergency Medicine | Admitting: Emergency Medicine

## 2016-09-30 ENCOUNTER — Encounter: Payer: Self-pay | Admitting: Emergency Medicine

## 2016-09-30 DIAGNOSIS — M25562 Pain in left knee: Secondary | ICD-10-CM | POA: Insufficient documentation

## 2016-09-30 MED ORDER — TRAMADOL HCL 50 MG PO TABS: 50.0000 mg | ORAL_TABLET | Freq: Three times a day (TID) | ORAL | 0 refills | Status: AC | PRN

## 2016-09-30 MED ORDER — KETOROLAC TROMETHAMINE 60 MG/2ML IM SOLN
30.0000 mg | Freq: Once | INTRAMUSCULAR | Status: AC
  Administered 2016-09-30: 30 mg via INTRAMUSCULAR
  Filled 2016-09-30: qty 2

## 2016-09-30 MED ORDER — NAPROXEN 500 MG PO TBEC: 500.0000 mg | DELAYED_RELEASE_TABLET | Freq: Two times a day (BID) | ORAL | 0 refills | Status: AC

## 2016-09-30 NOTE — ED Triage Notes (Signed)
Pt c/o left leg pain from knee down to ankle - pt states she could have twisted leg while working

## 2016-09-30 NOTE — ED Provider Notes (Signed)
[facility] Emergency Department Provider Note  ____________________________________________  Time seen: Approximately 4:29 PM  I have reviewed the triage vital signs and the nursing notes.   HISTORY  Chief Complaint Leg Pain    HPI Rebecca Watkins is a 61 y.o. female presenting to the emergency department with 7/10 acute posterior left knee pain. Patient states that she lifted a bag of IV fluids today in the ICU when she felt a throbbing and "pins and needle" pain radiating from her knee to the left ankle. Patient states that she has been apprehensive to bear weight on her left leg since onset of pain. Patient has noticed left posterior knee fullness but no erythema or edema of the skin overlying the left knee. She denies smoking, history of DVT, history of PE, prolonged immobility, recent surgery or trauma. Patient denies history of chronic back pain. Patient states that she has experienced some left knee discomfort in the past. However, the pain she is currently experiencing is not comparable to past symptoms. Patient has not attempted alleviating measures. Patient denies chest pain, chest tightness, shortness of breath, abdominal pain, nausea and vomiting. She has been afebrile.     Past Medical History:  Diagnosis Date  . Diverticulitis     Patient Active Problem List   Diagnosis Date Noted  . Bilateral knee pain 11/01/2015  . History of thrombocytopenia 11/01/2015  . AP (abdominal pain)   . Diverticulitis large intestine 10/01/2015    Past Surgical History:  Procedure Laterality Date  . COLONOSCOPY WITH PROPOFOL N/A 12/15/2015   Procedure: COLONOSCOPY WITH PROPOFOL;  Surgeon: Manya Silvas, MD;  Location: [facility] ENDOSCOPY;  Service: Endoscopy;  Laterality: N/A;  . TUBAL LIGATION      Prior to Admission medications   Medication Sig Start Date End Date Taking? Authorizing Provider  Multiple Vitamin (MULTI-VITAMINS) TABS Take 1 tablet by mouth  daily.    Historical Provider, MD  naproxen (EC NAPROSYN) 500 MG EC tablet Take 1 tablet (500 mg total) by mouth 2 (two) times daily with a meal. 09/30/16 10/10/16  Lannie Fields, PA-C  ondansetron (ZOFRAN) 4 MG tablet Take 1 tablet by mouth daily. 09/20/15   Historical Provider, MD  PREMPRO 0.45-1.5 MG tablet Take 1 tablet by mouth daily.  09/07/15   Historical Provider, MD  traMADol (ULTRAM) 50 MG tablet Take 1 tablet (50 mg total) by mouth 3 (three) times daily as needed. 09/30/16 10/03/16  Lannie Fields, PA-C    Allergies Erythromycin  Family History  Problem Relation Age of Onset  . Breast cancer Mother 70  . Hyperlipidemia Mother   . Hypertension Mother   . Breast cancer Sister 106  . Breast cancer Maternal Aunt 70  . Hyperlipidemia Father   . Hypertension Father   . Diabetes Mellitus I Daughter     Social History Social History  Substance Use Topics  . Smoking status: Never Smoker  . Smokeless tobacco: Never Used  . Alcohol use No    Review of Systems  Constitutional: No fever/chills ENT: No upper respiratory complaints. Cardiovascular: no chest pain. Respiratory: no cough. No SOB. Gastrointestinal: No abdominal pain.  No nausea, no vomiting.  No diarrhea.  No constipation. Musculoskeletal: Patient has radiating left knee pain to the ankle.  Skin: Negative for rash, abrasions, lacerations, ecchymosis. Neurological: Negative for headaches, focal weakness or numbness. ____________________________________________   PHYSICAL EXAM:  VITAL SIGNS: ED Triage Vitals  Enc Vitals Group     BP 09/30/16 1555 138/61  Pulse Rate 09/30/16 1555 70     Resp 09/30/16 1555 15     Temp 09/30/16 1555 98.3 F (36.8 C)     Temp Source 09/30/16 1555 Oral     SpO2 09/30/16 1555 100 %     Weight 09/30/16 1556 150 lb (68 kg)     Height 09/30/16 1556 5\' 7"  (1.702 m)     Head Circumference --      Peak Flow --      Pain Score 09/30/16 1556 7     Pain Loc --      Pain Edu? --       Excl. in [place]? --      Constitutional: Alert and oriented. Well appearing and in no acute distress. Eyes: Conjunctivae are normal. PERRL. EOMI. Head: Atraumatic. Neck: No cervical spine tenderness to palpation. Hematological/Lymphatic/Immunilogical: No cervical lymphadenopathy.  Cardiovascular: Normal rate, regular rhythm. Normal S1 and S2.  Good peripheral circulation. Respiratory: Normal respiratory effort without tachypnea or retractions. Lungs CTAB. Good air entry to the bases with no decreased or absent breath sounds. Gastrointestinal: Bowel sounds 4 quadrants. Soft and nontender to palpation. No guarding or rigidity. No palpable masses. No distention. No CVA tenderness. Musculoskeletal: Patient has 5 out of 5 strength in the upper and lower extremities bilaterally. To inspection, knees appear symmetric. Patient has full range of motion at the hip knee and ankle bilaterally. Patient has negative anterior and posterior drawer test, left. Negative McMurray's, left. Patient has a negative straight leg raise test, left.  Patient has tenderness elicited with palpation of the left popliteal space. Patient's symptoms are not reproduced with flexion and extension at the spine. Neurologic:  Normal speech and language. No gross focal neurologic deficits are appreciated. Cranial nerves: 2-10 normal as tested.  Sensorimotor: No sensory loss or abnormal reflexes. Vision: No visual field deficts noted to confrontation.  Skin:  Skin is warm, dry and intact. No rash noted, erythema or edema of the skin overlying the left lower extremity. Psychiatric: Mood and affect are normal. Speech and behavior are normal. Patient exhibits appropriate insight and judgement. __________________________________________   LABS (all labs ordered are listed, but only abnormal results are displayed)  Labs Reviewed - No data to  display ____________________________________________  EKG   ____________________________________________  RADIOLOGY Unk Pinto, personally viewed and evaluated these images (plain radiographs) as part of my medical decision making, as well as reviewing the written report by the radiologist.  US Venous Img Lower Unilateral Left  Result Date: 09/30/2016 CLINICAL DATA:  Left posterior knee pain EXAM: LEFT LOWER EXTREMITY VENOUS DOPPLER ULTRASOUND TECHNIQUE: Gray-scale sonography with compression, as well as color and duplex ultrasound, were performed to evaluate the deep venous system from the level of the common femoral vein through the popliteal and proximal calf veins. COMPARISON:  None FINDINGS: Normal compressibility of the common femoral, superficial femoral, and popliteal veins, as well as the proximal calf veins. No filling defects to suggest DVT on grayscale or color Doppler imaging. Doppler waveforms show normal direction of venous flow, normal respiratory phasicity and response to augmentation. Survey views of the contralateral common femoral vein are unremarkable. IMPRESSION: No evidence of  lower extremity deep vein thrombosis, left. Electronically Signed   By: Lucrezia Europe M.D.   On: 09/30/2016 17:14   Dg Knee Complete 4 Views Left  Result Date: 09/30/2016 CLINICAL DATA:  Left knee pain radiating to ankle EXAM: LEFT KNEE - COMPLETE 4+ VIEW COMPARISON:  None. FINDINGS: No evidence  of fracture, dislocation, or joint effusion. Minimal spurring of the patella, lateral femoral condyle and tibial plateau. Slight joint space narrowing of the medial femorotibial compartment. Soft tissues are unremarkable. IMPRESSION: No acute osseous abnormality. Minor degenerative spurring across the femorotibial and patellofemoral compartments with slight joint space narrowing of the medial femorotibial compartment. Electronically Signed   By: Ashley Royalty M.D.   On: 09/30/2016 17:44     ____________________________________________    PROCEDURES  Procedure(s) performed:    Procedures    Medications  ketorolac (TORADOL) injection 30 mg (30 mg Intramuscular Given 09/30/16 1811)     ____________________________________________   INITIAL IMPRESSION / ASSESSMENT AND PLAN / ED COURSE  Pertinent labs & imaging results that were available during my care of the patient were reviewed by me and considered in my medical decision making (see chart for details).  Review of the [place] CSRS was performed in accordance of the [place] prior to dispensing any controlled drugs.     Assessment and Plan: Left Knee Pain:  Patient presents to the emergency department with left knee pain radiating to the left ankle. Venous ultrasound of the left lower extremity did not reveal evidence of thromboembolism. DG left knee indicated no acute fractures or bony abnormalities. A referral was made to orthopedics, Dr. Marry Guan - at patient's request. Patient was given an injection of Toradol in the emergency department. Patient was discharged with naproxen and in a short course of tramadol for pain. All patient questions were answered.  ____________________________________________  FINAL CLINICAL IMPRESSION(S) / ED DIAGNOSES  Final diagnoses:  Acute pain of left knee      NEW MEDICATIONS STARTED DURING THIS VISIT:  New Prescriptions   NAPROXEN (EC NAPROSYN) 500 MG EC TABLET    Take 1 tablet (500 mg total) by mouth 2 (two) times daily with a meal.   TRAMADOL (ULTRAM) 50 MG TABLET    Take 1 tablet (50 mg total) by mouth 3 (three) times daily as needed.        This chart was dictated using voice recognition software/Dragon. Despite best efforts to proofread, errors can occur which can change the meaning. Any change was purely unintentional.    Lannie Fields, PA-C 09/30/16 Clayton Quigley, MD 10/02/16 1537

## 2016-09-30 NOTE — ED Notes (Signed)
See triage note  States she developed a sharp pain to left knee that radiates into ankle  Denies any fall  Not sure if she twisted it while working today  Unable to bear full wt d/t pain

## 2016-10-10 DIAGNOSIS — M25562 Pain in left knee: Secondary | ICD-10-CM | POA: Diagnosis not present

## 2016-10-11 ENCOUNTER — Other Ambulatory Visit: Payer: Self-pay | Admitting: Orthopedic Surgery

## 2016-10-11 DIAGNOSIS — M25562 Pain in left knee: Secondary | ICD-10-CM

## 2016-10-15 ENCOUNTER — Ambulatory Visit
Admission: RE | Admit: 2016-10-15 | Discharge: 2016-10-15 | Disposition: A | Discharge status: Home / Self Care | Payer: 59 | Source: Ambulatory Visit | Attending: Orthopedic Surgery | Admitting: Orthopedic Surgery

## 2016-10-15 DIAGNOSIS — M25562 Pain in left knee: Secondary | ICD-10-CM

## 2016-10-15 DIAGNOSIS — M7989 Other specified soft tissue disorders: Secondary | ICD-10-CM | POA: Diagnosis not present

## 2016-10-15 DIAGNOSIS — M23222 Derangement of posterior horn of medial meniscus due to old tear or injury, left knee: Secondary | ICD-10-CM | POA: Insufficient documentation

## 2016-10-15 DIAGNOSIS — M222X2 Patellofemoral disorders, left knee: Secondary | ICD-10-CM | POA: Diagnosis not present

## 2016-10-15 DIAGNOSIS — M25462 Effusion, left knee: Secondary | ICD-10-CM | POA: Insufficient documentation

## 2016-10-21 ENCOUNTER — Other Ambulatory Visit: Payer: Self-pay | Admitting: Orthopedic Surgery

## 2016-10-22 ENCOUNTER — Ambulatory Visit: Payer: 59

## 2016-10-23 ENCOUNTER — Encounter
Admission: RE | Admit: 2016-10-23 | Discharge: 2016-10-23 | Disposition: A | Discharge status: Home / Self Care | Payer: 59 | Source: Ambulatory Visit | Attending: Orthopedic Surgery | Admitting: Orthopedic Surgery

## 2016-10-23 NOTE — Patient Instructions (Signed)
  Your procedure is scheduled on: 10-30-16 Report to Same Day Surgery 2nd floor medical mall [facility] Entrance-take elevator on left to 2nd floor.  Check in with surgery information desk.) To find out your arrival time please call [phone] between 1PM - 3PM on 10-29-16  Remember: Instructions that are not followed completely may result in serious medical risk, up to and including death, or upon the discretion of your surgeon and anesthesiologist your surgery may need to be rescheduled.    _x___ 1. Do not eat food or drink liquids after midnight. No gum chewing or  hard candies.     __x__ 2. No Alcohol for 24 hours before or after surgery.   __x__3. No Smoking for 24 prior to surgery.   ____  4. Bring all medications with you on the day of surgery if instructed.    __x__ 5. Notify your doctor if there is any change in your medical condition     (cold, fever, infections).     Do not wear jewelry, make-up, hairpins, clips or nail polish.  Do not wear lotions, powders, or perfumes. You may wear deodorant.  Do not shave 48 hours prior to surgery. Men may shave face and neck.  Do not bring valuables to the hospital.    [facility] is not responsible for any belongings or valuables.               Contacts, dentures or bridgework may not be worn into surgery.  Leave your suitcase in the car. After surgery it may be brought to your room.  For patients admitted to the hospital, discharge time is determined by your  treatment team.   Patients discharged the day of surgery will not be allowed to drive home.  You will need someone to drive you home and stay with you the night of your procedure.    Please read over the following fact sheets that you were given:   [facility] Preparing for Surgery and or MRSA Information   ____ Take anti-hypertensive (unless it includes a diuretic), cardiac, seizure, asthma,     anti-reflux and psychiatric medicines. These include:  1.  NONE  2.  3.  4.  5.  6.  ____Fleets enema or Magnesium Citrate as directed.   ____ Use CHG Soap or sage wipes as directed on instruction sheet   ____ Use inhalers on the day of surgery and bring to hospital day of surgery  ____ Stop Metformin and Janumet 2 days prior to surgery.    ____ Take 1/2 of usual insulin dose the night before surgery and none on the morning     surgery.   ____ Follow recommendations from Cardiologist, Pulmonologist or PCP regarding stopping Aspirin, Coumadin, Pllavix ,Eliquis, Effient, or Pradaxa, and Pletal.  X____Stop Anti-inflammatories such as Advil, Aleve, Ibuprofen, Motrin, NAPROXEN, Naprosyn, Goodies powders or aspirin products NOWOK to take Tylenol    ____ Stop supplements until after surgery.     ____ Bring C-Pap to the hospital.

## 2016-10-24 DIAGNOSIS — H524 Presbyopia: Secondary | ICD-10-CM | POA: Diagnosis not present

## 2016-10-25 ENCOUNTER — Encounter: Payer: Self-pay | Admitting: Family Medicine

## 2016-10-25 ENCOUNTER — Ambulatory Visit (INDEPENDENT_AMBULATORY_CARE_PROVIDER_SITE_OTHER): Payer: 59 | Admitting: Family Medicine

## 2016-10-25 VITALS — BP 122/80 | HR 75 | Temp 98.0°F | Wt 166.2 lb

## 2016-10-25 DIAGNOSIS — Z01818 Encounter for other preprocedural examination: Secondary | ICD-10-CM | POA: Diagnosis not present

## 2016-10-25 LAB — BASIC METABOLIC PANEL
BUN: 22 mg/dL (ref 6–23)
CALCIUM: 9.2 mg/dL (ref 8.4–10.5)
CHLORIDE: 104 meq/L (ref 96–112)
CO2: 32 meq/L (ref 19–32)
CREATININE: 0.79 mg/dL (ref 0.40–1.20)
GFR: 78.28 mL/min (ref >60.00)
GLUCOSE: 88 mg/dL (ref 70–99)
Potassium: 4.2 mEq/L (ref 3.5–5.1)
SODIUM: 141 meq/L (ref 135–145)

## 2016-10-25 LAB — CBC
HEMATOCRIT: 41.6 % (ref 36.0–46.0)
HEMOGLOBIN: 13.6 g/dL (ref 12.0–15.0)
MCHC: 32.7 g/dL (ref 30.0–36.0)
MCV: 95.4 fl (ref 78.0–100.0)
Platelets: 165 10*3/uL (ref 150.0–400.0)
RBC: 4.36 Mil/uL (ref 3.87–5.11)
RDW: 13.1 % (ref 11.5–15.5)
WBC: 6.4 10*3/uL (ref 4.0–10.5)

## 2016-10-25 LAB — APTT: APTT: 29.4 s (ref 23.4–32.7)

## 2016-10-25 LAB — PROTIME-INR
INR: 1 ratio (ref 0.8–1.0)
Prothrombin Time: 10.1 s (ref 9.6–13.1)

## 2016-10-25 NOTE — Progress Notes (Addendum)
  Tommi Rumps, MD Phone: [phone]  Rebecca Watkins is a 61 y.o. female who presents today for re-op exam.  Patient presents for preoperative examination for partial medial meniscectomy. She notes likely degenerative though also did have an acute injury when lifting something. She notes no chest pain or breathlessness when she climbs 2 flights of stairs. She notes no kidney disease or family history of anesthesia issues or personal history of anesthesia issues. No history of heart attack, stroke, irregular heartbeat, seizures, thyroid disease, liver disease, heart failure, asthma or bronchitis, or diabetes. She does have a history of low platelets though most recent check a year ago was normal.  PMH: nonsmoker.   ROS see history of present illness  Objective  Physical Exam Vitals:   10/25/16 1330  BP: 122/80  Pulse: 75  Temp: 98 F (36.7 C)    BP Readings from Last 3 Encounters:  10/25/16 122/80  09/30/16 130/67  12/15/15 (!) 107/56   Wt Readings from Last 3 Encounters:  10/25/16 166 lb 3.2 oz (75.4 kg)  09/30/16 150 lb (68 kg)  12/15/15 141 lb (64 kg)    Physical Exam  Constitutional: She is well-developed, well-nourished, and in no distress.  HENT:  Head: Normocephalic and atraumatic.  Cardiovascular: Normal rate, regular rhythm and normal heart sounds.   Pulmonary/Chest: Effort normal and breath sounds normal.  Abdominal: Soft. Bowel sounds are normal. She exhibits no distension. There is no tenderness. There is no rebound and no guarding.  Musculoskeletal: She exhibits no edema.  Mild effusion left medial joint line of the knee, mild tenderness, no warmth or erythema  Neurological: She is alert.  Skin: Skin is warm and dry.     Assessment/Plan: Please see individual problem list.  Pre-op examination Patient presents for preoperative examination. Overall doing well and has no chest pain or breathlessness with 2 flights of stairs. We will obtain lab work as  outlined below and then determine her total risk. An addendum will follow.  Addendum: Patient is low risk for cardiac complications with a gupta perioperative risk score of 0.02%. Also low risk for medical complications.  Orders Placed This Encounter  Procedures  . CBC  . Basic Metabolic Panel (BMET)  . INR/PT  . PTT   Tommi Rumps, MD [place]\

## 2016-10-25 NOTE — Assessment & Plan Note (Signed)
Patient presents for preoperative examination. Overall doing well and has no chest pain or breathlessness with 2 flights of stairs. We will obtain lab work as outlined below and then determine her total risk. An addendum will follow.

## 2016-10-25 NOTE — Patient Instructions (Signed)
Nice to see you. We will get some lab work today and then fax your surgical clearance to your surgeon.

## 2016-10-25 NOTE — Progress Notes (Signed)
Pre visit review using our clinic review tool, if applicable. No additional management support is needed unless otherwise documented below in the visit note. 

## 2016-10-29 ENCOUNTER — Telehealth: Payer: Self-pay | Admitting: Family Medicine

## 2016-10-29 NOTE — Telephone Encounter (Signed)
Called and left vm to inform patient I have faxed the form this morning

## 2016-10-29 NOTE — Telephone Encounter (Signed)
Pt called and stated that her surgeon has not received medical clearance. Pt's surgery is set for Thursday. Please call when completed. Please advise, thank you!  Send to Dr. Zandra Abts at [facility] fax [phone]  Call pt @ [phone]

## 2016-10-30 ENCOUNTER — Encounter: Payer: Self-pay | Admitting: *Deleted

## 2016-10-30 NOTE — Pre-Procedure Instructions (Signed)
MEDICAL CLEARANCE ON CHART AND IN EPIC FROM DR SONNENBERG-LOW RISK

## 2016-10-31 ENCOUNTER — Encounter: Payer: Self-pay | Admitting: *Deleted

## 2016-10-31 ENCOUNTER — Encounter
Admission: RE | Disposition: A | Discharge status: Home / Self Care | Payer: Self-pay | Source: Ambulatory Visit | Attending: Orthopedic Surgery

## 2016-10-31 ENCOUNTER — Ambulatory Visit: Payer: 59 | Admitting: Anesthesiology

## 2016-10-31 ENCOUNTER — Ambulatory Visit
Admission: RE | Admit: 2016-10-31 | Discharge: 2016-10-31 | Disposition: A | Discharge status: Home / Self Care | Payer: 59 | Source: Ambulatory Visit | Attending: Orthopedic Surgery | Admitting: Orthopedic Surgery

## 2016-10-31 DIAGNOSIS — M94262 Chondromalacia, left knee: Secondary | ICD-10-CM | POA: Diagnosis not present

## 2016-10-31 DIAGNOSIS — M659 Synovitis and tenosynovitis, unspecified: Secondary | ICD-10-CM | POA: Insufficient documentation

## 2016-10-31 DIAGNOSIS — M23232 Derangement of other medial meniscus due to old tear or injury, left knee: Secondary | ICD-10-CM | POA: Diagnosis not present

## 2016-10-31 DIAGNOSIS — S83232A Complex tear of medial meniscus, current injury, left knee, initial encounter: Secondary | ICD-10-CM | POA: Insufficient documentation

## 2016-10-31 DIAGNOSIS — M6752 Plica syndrome, left knee: Secondary | ICD-10-CM | POA: Diagnosis not present

## 2016-10-31 DIAGNOSIS — M65862 Other synovitis and tenosynovitis, left lower leg: Secondary | ICD-10-CM | POA: Diagnosis not present

## 2016-10-31 DIAGNOSIS — Y990 Civilian activity done for income or pay: Secondary | ICD-10-CM | POA: Insufficient documentation

## 2016-10-31 HISTORY — PX: KNEE ARTHROSCOPY WITH MEDIAL MENISECTOMY: SHX5651

## 2016-10-31 HISTORY — DX: Nausea with vomiting, unspecified: R11.2

## 2016-10-31 HISTORY — DX: Thrombocytopenia, unspecified: D69.6

## 2016-10-31 HISTORY — DX: Other specified postprocedural states: Z98.890

## 2016-10-31 SURGERY — ARTHROSCOPY, KNEE, WITH MEDIAL MENISCECTOMY
Anesthesia: General | Laterality: Left | Wound class: Clean

## 2016-10-31 MED ORDER — PHENYLEPHRINE HCL 10 MG/ML IJ SOLN
INTRAMUSCULAR | Status: DC | PRN
Start: 2016-10-31 — End: 2016-10-31
  Administered 2016-10-31 (×2): 100 ug via INTRAVENOUS

## 2016-10-31 MED ORDER — FENTANYL CITRATE (PF) 100 MCG/2ML IJ SOLN
INTRAMUSCULAR | Status: DC | PRN
  Administered 2016-10-31: 100 ug via INTRAVENOUS
  Administered 2016-10-31 (×3): 25 ug via INTRAVENOUS

## 2016-10-31 MED ORDER — FAMOTIDINE 20 MG PO TABS
ORAL_TABLET | ORAL | Status: AC
  Administered 2016-10-31: 20 mg via ORAL
  Filled 2016-10-31: qty 1

## 2016-10-31 MED ORDER — BUPIVACAINE-EPINEPHRINE (PF) 0.25% -1:200000 IJ SOLN
INTRAMUSCULAR | Status: AC
  Filled 2016-10-31: qty 30

## 2016-10-31 MED ORDER — FENTANYL CITRATE (PF) 250 MCG/5ML IJ SOLN
INTRAMUSCULAR | Status: AC
  Filled 2016-10-31: qty 5

## 2016-10-31 MED ORDER — CEFAZOLIN SODIUM-DEXTROSE 2-4 GM/100ML-% IV SOLN
INTRAVENOUS | Status: AC
  Filled 2016-10-31: qty 100

## 2016-10-31 MED ORDER — PROMETHAZINE HCL 25 MG/ML IJ SOLN
INTRAMUSCULAR | Status: AC
  Administered 2016-10-31: 6.25 mg via INTRAVENOUS
  Filled 2016-10-31: qty 1

## 2016-10-31 MED ORDER — LIDOCAINE HCL (PF) 2 % IJ SOLN
INTRAMUSCULAR | Status: AC
  Filled 2016-10-31: qty 2

## 2016-10-31 MED ORDER — PROPOFOL 10 MG/ML IV BOLUS
INTRAVENOUS | Status: AC
  Filled 2016-10-31: qty 20

## 2016-10-31 MED ORDER — PROMETHAZINE HCL 25 MG/ML IJ SOLN
6.2500 mg | Freq: Once | INTRAMUSCULAR | Status: AC
  Administered 2016-10-31: 6.25 mg via INTRAVENOUS

## 2016-10-31 MED ORDER — ONDANSETRON HCL 4 MG/2ML IJ SOLN
INTRAMUSCULAR | Status: DC | PRN
  Administered 2016-10-31: 4 mg via INTRAVENOUS

## 2016-10-31 MED ORDER — ASPIRIN EC 325 MG PO TBEC: 325.0000 mg | DELAYED_RELEASE_TABLET | Freq: Every day | ORAL | 0 refills | Status: DC

## 2016-10-31 MED ORDER — SODIUM CHLORIDE 0.9 % IJ SOLN
INTRAMUSCULAR | Status: AC
  Filled 2016-10-31: qty 10

## 2016-10-31 MED ORDER — HYDROCODONE-ACETAMINOPHEN 5-325 MG PO TABS: 1.0000 | ORAL_TABLET | ORAL | 0 refills | Status: DC | PRN

## 2016-10-31 MED ORDER — LIDOCAINE HCL (PF) 1 % IJ SOLN
INTRAMUSCULAR | Status: AC
  Filled 2016-10-31: qty 30

## 2016-10-31 MED ORDER — MIDAZOLAM HCL 2 MG/2ML IJ SOLN
INTRAMUSCULAR | Status: AC
  Filled 2016-10-31: qty 2

## 2016-10-31 MED ORDER — BUPIVACAINE-EPINEPHRINE 0.25% -1:200000 IJ SOLN
INTRAMUSCULAR | Status: DC | PRN
  Administered 2016-10-31: 20 mL

## 2016-10-31 MED ORDER — DEXAMETHASONE SODIUM PHOSPHATE 4 MG/ML IJ SOLN
INTRAMUSCULAR | Status: DC | PRN
  Administered 2016-10-31: 10 mg via INTRAVENOUS

## 2016-10-31 MED ORDER — KETOROLAC TROMETHAMINE 30 MG/ML IJ SOLN
INTRAMUSCULAR | Status: DC | PRN
  Administered 2016-10-31: 30 mg via INTRAVENOUS

## 2016-10-31 MED ORDER — MIDAZOLAM HCL 2 MG/2ML IJ SOLN
INTRAMUSCULAR | Status: DC | PRN
  Administered 2016-10-31: 1 mg via INTRAVENOUS

## 2016-10-31 MED ORDER — ONDANSETRON HCL 4 MG PO TABS: 4.0000 mg | ORAL_TABLET | Freq: Three times a day (TID) | ORAL | 0 refills | Status: DC | PRN

## 2016-10-31 MED ORDER — DEXAMETHASONE SODIUM PHOSPHATE 10 MG/ML IJ SOLN
INTRAMUSCULAR | Status: AC
  Filled 2016-10-31: qty 1

## 2016-10-31 MED ORDER — GLYCOPYRROLATE 0.2 MG/ML IJ SOLN
INTRAMUSCULAR | Status: AC
  Filled 2016-10-31: qty 1

## 2016-10-31 MED ORDER — OXYCODONE HCL 5 MG PO TABS: 5.0000 mg | ORAL_TABLET | Freq: Once | ORAL | Status: DC | PRN

## 2016-10-31 MED ORDER — ACETAMINOPHEN 10 MG/ML IV SOLN
INTRAVENOUS | Status: AC
Start: 2016-10-31 — End: ?
  Filled 2016-10-31: qty 100

## 2016-10-31 MED ORDER — CHLORHEXIDINE GLUCONATE CLOTH 2 % EX PADS: 6.0000 | MEDICATED_PAD | Freq: Once | CUTANEOUS | Status: DC

## 2016-10-31 MED ORDER — KETOROLAC TROMETHAMINE 30 MG/ML IJ SOLN
INTRAMUSCULAR | Status: AC
  Filled 2016-10-31: qty 1

## 2016-10-31 MED ORDER — FENTANYL CITRATE (PF) 100 MCG/2ML IJ SOLN: 25.0000 ug | INTRAMUSCULAR | Status: DC | PRN

## 2016-10-31 MED ORDER — OXYCODONE HCL 5 MG/5ML PO SOLN: 5.0000 mg | Freq: Once | ORAL | Status: DC | PRN

## 2016-10-31 MED ORDER — LIDOCAINE HCL (CARDIAC) 20 MG/ML IV SOLN
INTRAVENOUS | Status: DC | PRN
  Administered 2016-10-31: 100 mg via INTRAVENOUS

## 2016-10-31 MED ORDER — CEFAZOLIN SODIUM-DEXTROSE 2-4 GM/100ML-% IV SOLN
2.0000 g | INTRAVENOUS | Status: AC
  Administered 2016-10-31: 2 g via INTRAVENOUS

## 2016-10-31 MED ORDER — FAMOTIDINE 20 MG PO TABS
20.0000 mg | ORAL_TABLET | Freq: Once | ORAL | Status: AC
  Administered 2016-10-31: 20 mg via ORAL

## 2016-10-31 MED ORDER — ACETAMINOPHEN 10 MG/ML IV SOLN
INTRAVENOUS | Status: DC | PRN
  Administered 2016-10-31: 1000 mg via INTRAVENOUS

## 2016-10-31 MED ORDER — GLYCOPYRROLATE 0.2 MG/ML IJ SOLN
INTRAMUSCULAR | Status: DC | PRN
  Administered 2016-10-31: 0.2 mg via INTRAVENOUS

## 2016-10-31 MED ORDER — LACTATED RINGERS IV SOLN
INTRAVENOUS | Status: DC
  Administered 2016-10-31: 07:00:00 via INTRAVENOUS

## 2016-10-31 MED ORDER — PROPOFOL 10 MG/ML IV BOLUS
INTRAVENOUS | Status: DC | PRN
  Administered 2016-10-31: 140 mg via INTRAVENOUS

## 2016-10-31 MED ORDER — PHENYLEPHRINE HCL 10 MG/ML IJ SOLN
INTRAMUSCULAR | Status: AC
  Filled 2016-10-31: qty 1

## 2016-10-31 MED ORDER — LIDOCAINE HCL 1 % IJ SOLN
INTRAMUSCULAR | Status: DC | PRN
  Administered 2016-10-31: 10 mL

## 2016-10-31 MED ORDER — ONDANSETRON HCL 4 MG/2ML IJ SOLN
INTRAMUSCULAR | Status: AC
  Filled 2016-10-31: qty 2

## 2016-10-31 SURGICAL SUPPLY — 35 items
BUR RADIUS 3.5 (BURR) ×2 IMPLANT
BUR RADIUS 4.0X18.5 (BURR) ×2 IMPLANT
COOLER POLAR GLACIER W/PUMP (MISCELLANEOUS) ×2 IMPLANT
CUFF TOURN 24 STER (MISCELLANEOUS) IMPLANT
CUFF TOURN 30 STER DUAL PORT (MISCELLANEOUS) ×2 IMPLANT
DRAPE IMP U-DRAPE 54X76 (DRAPES) ×2 IMPLANT
DURAPREP 26ML APPLICATOR (WOUND CARE) ×6 IMPLANT
GAUZE PETRO XEROFOAM 1X8 (MISCELLANEOUS) ×2 IMPLANT
GAUZE SPONGE 4X4 12PLY STRL (GAUZE/BANDAGES/DRESSINGS) ×2 IMPLANT
GLOVE BIOGEL PI IND STRL 9 (GLOVE) ×1 IMPLANT
GLOVE BIOGEL PI INDICATOR 9 (GLOVE) ×1
GLOVE SURG 9.0 ORTHO LTXF (GLOVE) ×4 IMPLANT
GOWN STRL REUS TWL 2XL XL LVL4 (GOWN DISPOSABLE) ×2 IMPLANT
GOWN STRL REUS W/ TWL LRG LVL3 (GOWN DISPOSABLE) ×1 IMPLANT
GOWN STRL REUS W/TWL LRG LVL3 (GOWN DISPOSABLE) ×1
IV LACTATED RINGER IRRG 3000ML (IV SOLUTION) ×6
IV LR IRRIG 3000ML ARTHROMATIC (IV SOLUTION) ×6 IMPLANT
KIT RM TURNOVER STRD PROC AR (KITS) ×2 IMPLANT
MANIFOLD NEPTUNE II (INSTRUMENTS) ×2 IMPLANT
PACK ARTHROSCOPY KNEE (MISCELLANEOUS) ×2 IMPLANT
PAD ABD DERMACEA PRESS 5X9 (GAUZE/BANDAGES/DRESSINGS) ×4 IMPLANT
PAD WRAPON POLAR KNEE (MISCELLANEOUS) ×1 IMPLANT
PROBE BIPOLAR ATHRO 135MM 90D (MISCELLANEOUS) ×2 IMPLANT
SET TUBE SUCT SHAVER OUTFL 24K (TUBING) ×2 IMPLANT
SET TUBE TIP INTRA-ARTICULAR (MISCELLANEOUS) ×2 IMPLANT
SOL PREP PVP 2OZ (MISCELLANEOUS)
SOLUTION PREP PVP 2OZ (MISCELLANEOUS) IMPLANT
STRIP CLOSURE SKIN 1/2X4 (GAUZE/BANDAGES/DRESSINGS) ×2 IMPLANT
SUT ETHILON 4-0 (SUTURE) ×1
SUT ETHILON 4-0 FS2 18XMFL BLK (SUTURE) ×1
SUTURE ETHLN 4-0 FS2 18XMF BLK (SUTURE) ×1 IMPLANT
TUBING ARTHRO INFLOW-ONLY STRL (TUBING) ×2 IMPLANT
WAND HAND CNTRL MULTIVAC 50 (MISCELLANEOUS) IMPLANT
WAND HAND CNTRL MULTIVAC 90 (MISCELLANEOUS) ×2 IMPLANT
WRAPON POLAR PAD KNEE (MISCELLANEOUS) ×2

## 2016-10-31 NOTE — Op Note (Signed)
PATIENT:  Rebecca Watkins  PRE-OPERATIVE DIAGNOSIS:  TEAR OF MEDIAL MENISCUS, LEFT KNEE  POST-OPERATIVE DIAGNOSIS:  Left knee medial meniscus tear with radial component involving the posterior root, medial and suprapatella plica, extensive synovitis, chondromalacia of the medial femoral condyle, medial tibial plateau and undersurface of the patella  PROCEDURE:  LEFT KNEE ARTHROSCOPY WITH  Partial MEDIAL MENISECTOMY, extensive synovectomy, removal of medial and suprapatella plica, chondroplasty of medial femoral condyle and undersurface of patella  SURGEON:  Thornton Park, MD  ANESTHESIA:   General  PREOPERATIVE INDICATIONS:  Rebecca Watkins  61 y.o. femalewith a diagnosis of TEAR OF MEDIAL MENISCUS of the left knee after an injury at work who failed conservative management and elected for surgical management.    The risks benefits and alternatives were discussed with the patient preoperatively including the risks of infection, bleeding, nerve injury, knee stiffness, persistent pain, osteoarthritis and the need for further surgery. Medical  risks include DVT and pulmonary embolism, myocardial infarction, stroke, pneumonia, respiratory failure and death. The patient understood these risks and wished to proceed.  OPERATIVE FINDINGS: Diffuse chondromalacia of the medial femoral condyle and undersurface of the patella. Mild chondral fraying of the trochlea. Patient had a complex tear of the medial meniscus with a radial component extending completely through the meniscus with diastases near the meniscal root. Patient also had extensive synovitis with a medial and suprapatella plica.  OPERATIVE PROCEDURE: Patient was met in the preoperative area. The left lower extremity was signed with the word yes and my initials according the hospital's correct site of surgery protocol.  I performed a preoperative history and physical with the patient's husband at the bedside. Patient denied any recent  illness.  The patient was brought to the operating room where they was placed supine on the operative table. General anesthesia was administered. The patient was prepped and draped in a sterile fashion.  A timeout was performed to verify the patient's name, date of birth, medical record number, correct site of surgery correct procedure to be performed. It was also used to verify the patient received antibiotics that all appropriate instruments, and radiographic studies were available in the room. Once all in attendance were in agreement, the case began.  Proposed arthroscopy incisions were drawn out with a surgical marker. These were pre-injected with 1% lidocaine plain. An 11 blade was used to establish an inferior lateral and inferomedial portals. The inferomedial portal was created using a 18-gauge spinal needle under direct visualization.  A full diagnostic examination of the knee was performed including the suprapatellar pouch, patellofemoral joint, medial lateral compartments as well as the medial lateral gutters, the intercondylar notch in the posterior knee.  Patient had the medial meniscal tear treated with a 4-0 resector shaver blade and straight duckbill basket. The radial portion of the tear was deemed unrepairable. The meniscus was debrided until a stable rim was achieved.  A chondroplasty of the medial femoral condyle and undersurface of patella was also performed using a 4-0 resector shaver blade.  A partial synovectomy and debridement of the medial and suprapatella plica was also performed using a 4-0 resector shaver blade and 90 ArthroCare wand.  The knee was then copiously lavaged. All arthroscopic instruments were removed. The 2 arthroscopy portals were closed with 4-0 nylon. Steri-Strips were applied along with a dry sterile and compressive dressing. The patient was brought to the PACU in stable condition. I was scrubbed and present for the entire case and all sharp and instrument  counts were correct at  the conclusion the case. I spoke with the patient's family postoperatively to let them know the case was performed without complication and the patient was stable in the recovery room.    Timoteo Gaul, MD

## 2016-10-31 NOTE — Discharge Instructions (Signed)

## 2016-10-31 NOTE — Anesthesia Postprocedure Evaluation (Signed)
Anesthesia Post Note  Patient: Rebecca Watkins  Procedure(s) Performed: Procedure(s) (LRB): KNEE ARTHROSCOPY WITH MEDIAL MENISECTOMY (Left)  Patient location during evaluation: PACU Anesthesia Type: General Level of consciousness: awake and alert Pain management: pain level controlled Vital Signs Assessment: post-procedure vital signs reviewed and stable Respiratory status: spontaneous breathing, nonlabored ventilation, respiratory function stable and patient connected to nasal cannula oxygen Cardiovascular status: blood pressure returned to baseline and stable Postop Assessment: no signs of nausea or vomiting Anesthetic complications: no     Last Vitals:  Vitals:   10/31/16 1116 10/31/16 1222  BP: 126/70 (!) 119/58  Pulse: 62 66  Resp: 16 16  Temp: 36.7 C 37.3 C    Last Pain:  Vitals:   10/31/16 1222  TempSrc: Temporal  PainSc: 0-No pain                 Precious Haws Martia Dalby

## 2016-10-31 NOTE — H&P (Signed)
PREOPERATIVE H&P  Chief Complaint: B63.893T Complex tear of medial menscus  HPI: Rebecca Watkins is a 61 y.o. female who presents for preoperative history and physical with a diagnosis of S83.232A Complex tear of medial menscus. Symptoms are rated as moderate to severe, and have been worsening.  This is significantly impairing activities of daily living.  She has elected for surgical management.   Past Medical History:  Diagnosis Date  . Diverticulitis   . Temporary low platelet count (HCC)    H/O   Past Surgical History:  Procedure Laterality Date  . COLONOSCOPY WITH PROPOFOL N/A 12/15/2015   Procedure: COLONOSCOPY WITH PROPOFOL;  Surgeon: Manya Silvas, MD;  Location: [facility] ENDOSCOPY;  Service: Endoscopy;  Laterality: N/A;  . TUBAL LIGATION     Social History   Social History  . Marital status: Married    Spouse name: N/A  . Number of children: N/A  . Years of education: N/A   Social History Main Topics  . Smoking status: Never Smoker  . Smokeless tobacco: Never Used  . Alcohol use No  . Drug use: No  . Sexual activity: Not Asked   Other Topics Concern  . None   Social History Narrative  . None   Family History  Problem Relation Age of Onset  . Breast cancer Mother 76  . Hyperlipidemia Mother   . Hypertension Mother   . Breast cancer Sister 4  . Breast cancer Maternal Aunt 70  . Hyperlipidemia Father   . Hypertension Father   . Diabetes Mellitus I Daughter    Allergies  Allergen Reactions  . Ciprofloxacin Rash  . Erythromycin Rash    Has patient had a PCN reaction causing immediate rash, facial/tongue/throat swelling, SOB or lightheadedness with hypotension: Yes Has patient had a PCN reaction causing severe rash involving mucus membranes or skin necrosis: No Has patient had a PCN reaction that required hospitalization No Has patient had a PCN reaction occurring within the last 10 years: No If all of the above answers are "NO", then may proceed with  Cephalosporin use.  . Metronidazole Rash   Prior to Admission medications   Medication Sig Start Date End Date Taking? Authorizing Provider  acetaminophen (TYLENOL) 325 MG tablet Take 650 mg by mouth every 6 (six) hours as needed for mild pain or moderate pain.   Yes Historical Provider, MD  Multiple Vitamin (MULTI-VITAMINS) TABS Take 1 tablet by mouth daily.   Yes Historical Provider, MD  naproxen (NAPROSYN) 500 MG tablet Take 500 mg by mouth 2 (two) times daily as needed for moderate pain.    Yes Historical Provider, MD  PREMPRO 0.45-1.5 MG tablet Take 1 tablet by mouth daily.  09/07/15  Yes Historical Provider, MD     Positive ROS: All other systems have been reviewed and were otherwise negative with the exception of those mentioned in the HPI and as above.  Physical Exam: General: Alert, no acute distress Cardiovascular: Regular rate and rhythm, no murmurs rubs or gallops.  No pedal edema Respiratory: Clear to auscultation bilaterally, no wheezes rales or rhonchi. No cyanosis, no use of accessory musculature GI: No organomegaly, abdomen is soft and non-tender nondistended with positive bowel sounds. Skin: Skin intact, no lesions within the operative field. Neurologic: Sensation intact distally Psychiatric: Patient is competent for consent with normal mood and affect Lymphatic: No cervical lymphadenopathy  MUSCULOSKELETAL: Left knee: Patient's range of motion is from 0-120. She has tenderness of medial joint line. She has a positive McMurray's test.  Patient has no calf tenderness or lower leg edema. She has intact sensation in both lower extremity. She can dorsiflex and plantar flex her ankles. She has palpable pedal pulses. Patient still, crepitus but no grind or apprehension.  Assessment: S83.232A Complex tear of medial menscus  Plan: Plan for Procedure(s): LEFT KNEE ARTHROSCOPY WITH MEDIAL MENISECTOMY  I splinted the details of the operation as well as the postoperative course  with the patient and her husband in the preoperative area today.  I discussed the risks and benefits of surgery with them. They understand risks include but are not limited to infection, bleeding requiring blood transfusion, nerve or blood vessel injury, joint stiffness or loss of motion, persistent pain, weakness or instability, malunion, nonunion and hardware failure and the need for further surgery. Medical risks include but are not limited to DVT and pulmonary embolism, myocardial infarction, stroke, pneumonia, respiratory failure and death. Patient understood these risks and wished to proceed.    Thornton Park, MD   10/31/2016 7:44 AM

## 2016-10-31 NOTE — Anesthesia Post-op Follow-up Note (Cosign Needed)
Anesthesia QCDR form completed.        

## 2016-10-31 NOTE — Transfer of Care (Signed)
Immediate Anesthesia Transfer of Care Note  Patient: Rebecca Watkins  Procedure(s) Performed: Procedure(s): KNEE ARTHROSCOPY WITH MEDIAL MENISECTOMY (Left)  Patient Location: PACU  Anesthesia Type:General  Level of Consciousness: sedated  Airway & Oxygen Therapy: Patient Spontanous Breathing and Patient connected to nasal cannula oxygen  Post-op Assessment: Report given to RN and Post -op Vital signs reviewed and stable  Post vital signs: Reviewed and stable  Last Vitals:  Vitals:   10/31/16 0614  BP: 132/67  Pulse: 72  Resp: 12  Temp: 36.6 C    Last Pain:  Vitals:   10/31/16 0614  TempSrc: Tympanic      Patients Stated Pain Goal: 5 (94/70/96 2836)  Complications: No apparent anesthesia complications

## 2016-10-31 NOTE — Anesthesia Preprocedure Evaluation (Signed)
Anesthesia Evaluation  Patient identified by MRN, date of birth, ID band Patient awake    Reviewed: Allergy & Precautions, H&P , NPO status , Patient's Chart, lab work & pertinent test results  History of Anesthesia Complications Negative for: history of anesthetic complications  Airway Mallampati: II  TM Distance: <3 FB Neck ROM: full    Dental  (+) Poor Dentition, Chipped, Caps   Pulmonary neg pulmonary ROS, neg shortness of breath,    Pulmonary exam normal breath sounds clear to auscultation       Cardiovascular Exercise Tolerance: Good (-) angina(-) Past MI and (-) DOE negative cardio ROS Normal cardiovascular exam Rhythm:regular Rate:Normal     Neuro/Psych negative neurological ROS  negative psych ROS   GI/Hepatic negative GI ROS, Neg liver ROS, neg GERD  ,  Endo/Other  negative endocrine ROS  Renal/GU      Musculoskeletal   Abdominal   Peds  Hematology negative hematology ROS (+)   Anesthesia Other Findings Past Medical History: No date: Diverticulitis No date: Temporary low platelet count (HCC)     Comment: H/O  Past Surgical History: 12/15/2015: COLONOSCOPY WITH PROPOFOL N/A     Comment: Procedure: COLONOSCOPY WITH PROPOFOL;                Surgeon: Manya Silvas, MD;  Location: [facility]               ENDOSCOPY;  Service: Endoscopy;  Laterality:               N/A; No date: TUBAL LIGATION     Reproductive/Obstetrics negative OB ROS                             Anesthesia Physical Anesthesia Plan  ASA: III  Anesthesia Plan: General LMA   Post-op Pain Management:    Induction:   Airway Management Planned:   Additional Equipment:   Intra-op Plan:   Post-operative Plan:   Informed Consent: I have reviewed the patients History and Physical, chart, labs and discussed the procedure including the risks, benefits and alternatives for the proposed anesthesia with the  patient or authorized representative who has indicated his/her understanding and acceptance.   Dental Advisory Given  Plan Discussed with: Anesthesiologist, CRNA and Surgeon  Anesthesia Plan Comments:         Anesthesia Quick Evaluation

## 2016-10-31 NOTE — Anesthesia Procedure Notes (Signed)
Procedure Name: LMA Insertion Date/Time: 10/31/2016 7:59 AM Performed by: Rosaria Ferries, Somaly Marteney Pre-anesthesia Checklist: Patient identified, Emergency Drugs available, Suction available and Patient being monitored Patient Re-evaluated:Patient Re-evaluated prior to inductionOxygen Delivery Method: Circle system utilized Preoxygenation: Pre-oxygenation with 100% oxygen Intubation Type: IV induction LMA Size: 4.0 Placement Confirmation: breath sounds checked- equal and bilateral Tube secured with: Tape Dental Injury: Teeth and Oropharynx as per pre-operative assessment

## 2016-11-12 ENCOUNTER — Ambulatory Visit: Payer: 59 | Attending: Orthopedic Surgery | Admitting: Physical Therapy

## 2016-11-12 ENCOUNTER — Encounter: Payer: Self-pay | Admitting: Physical Therapy

## 2016-11-12 DIAGNOSIS — R2689 Other abnormalities of gait and mobility: Secondary | ICD-10-CM | POA: Diagnosis not present

## 2016-11-12 DIAGNOSIS — M25562 Pain in left knee: Secondary | ICD-10-CM | POA: Diagnosis not present

## 2016-11-12 NOTE — Therapy (Signed)
[place] [facility] [address], [place], [address] Phone: [phone]   Fax:  [phone]  Physical Therapy Evaluation  Patient Details  Name: Rebecca Watkins MRN: [id number] Date of Birth: [birth year] Referring Provider: Thornton Park, MD  Encounter Date: 11/12/2016      PT End of Session - 11/12/16 1255    Visit Number 1   Number of Visits 17   Date for PT Re-Evaluation 01/07/17   PT Start Time 1033   PT Stop Time 1127   PT Time Calculation (min) 54 min   Equipment Utilized During Treatment Other (comment)  L knee brace   Activity Tolerance Patient tolerated treatment well;Patient limited by pain   Behavior During Therapy [facility] for tasks assessed/performed      Past Medical History:  Diagnosis Date  . Diverticulitis   . PONV (postoperative nausea and vomiting)   . Temporary low platelet count (HCC)    H/O    Past Surgical History:  Procedure Laterality Date  . COLONOSCOPY WITH PROPOFOL N/A 12/15/2015   Procedure: COLONOSCOPY WITH PROPOFOL;  Surgeon: Manya Silvas, MD;  Location: [facility] ENDOSCOPY;  Service: Endoscopy;  Laterality: N/A;  . KNEE ARTHROSCOPY WITH MEDIAL MENISECTOMY Left 10/31/2016   Procedure: KNEE ARTHROSCOPY WITH MEDIAL MENISECTOMY;  Surgeon: Thornton Park, MD;  Location: [facility] ORS;  Service: Orthopedics;  Laterality: Left;  . TUBAL LIGATION      There were no vitals filed for this visit.       Subjective Assessment - 11/12/16 1246    Subjective s/p L knee menisectomy   Pertinent History Pt is s/p tear of L medial meniscus s/p L knee arthroscopy with partial medial menisectomy, extensive synovecomy, removal of medial and suprapatella plica, chondroplasty of medial femoral condyle and undersurface of patella on 10/31/16. Pt reports that she is WBAT per her MD.  No protocol provided from MD.  Pt is using polar ice each day for a few hours off and on with pain relief.  Pt reports she  can fall asleep at night after finding a comfortable position but is awoken up to 20x/night due to pain and pins and needles in LLE and pt has a hard time finding a comfortable position after this.  Pins and needles associated with numbness down to her L foot which is intermittent. Patient's goals include: return to exercising again, walking again, and back to work.  Pt reports occasional L knee buckle and MD instructed pt to wear knee brace until this improved which pt presents with on Eval today. Pt is using RW at all times saying, "I don't feel confident not using it all the time". Still has stabbing pains on the medial side of her L knee which is intermittent, previously this was constant.  Pt denies any previous hip, knee, or ankle injuries to either LE.   Limitations Walking;Standing;House hold activities   Patient Stated Goals to return to work, walking, exercising   Currently in Pain? Yes   Pain Score 6    Pain Location Knee   Pain Orientation Left   Pain Descriptors / Indicators Aching;Constant;Tightness   Pain Type Surgical pain   Pain Onset 1 to 4 weeks ago   Pain Frequency Constant   Aggravating Factors  Sleeping (staying in one position for a long duration), longer distance ambulation, knee flexion   Pain Relieving Factors use of polar care, pain medication   Effect of Pain on Daily Activities Requires assist for shower transfers.  Uses RW to ambulate.  Needs assist with household activities (i.e. cooking, cleaning)   Multiple Pain Sites No            [facility] PT Assessment - 11/12/16 0957      Assessment   Medical Diagnosis s/p medial menisectomy repair   Referring Provider Thornton Park, MD   Onset Date/Surgical Date 10/31/16   Hand Dominance Right   Next MD Visit April 13th with Dr. Mack Guise   Prior Therapy Yes for shoulder     Precautions   Precautions Fall   Precaution Comments Will need to follow standard protocol for s/p menisectomy as MD did not provide protocol    Required Braces or Orthoses Other Brace/Splint   Other Brace/Splint pt wearing L knee brace to session      Restrictions   Weight Bearing Restrictions Yes   LLE Weight Bearing Weight bearing as tolerated     Balance Screen   Has the patient fallen in the past 6 months No   Has the patient had a decrease in activity level because of a fear of falling?  Yes   Is the patient reluctant to leave their home because of a fear of falling?  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, gym     Cognition   Overall Cognitive Status Within Functional Limits for tasks assessed     ROM / Strength   AROM / PROM / Strength Strength     Strength   Overall Strength Deficits   Overall Strength Comments No appropriate to test L knee strength at this time   Strength Assessment Site Hip;Knee   Right/Left Hip Right;Left   Right Hip Flexion 5/5   Right Hip ABduction 5/5   Right Hip ADduction 5/5   Left Hip Flexion 4-/5   Left Hip ABduction 4+/5   Left Hip ADduction 4-/5   Right/Left Knee Left;Right   Right Knee Flexion 5/5   Right Knee Extension 5/5      EXAMINATION   Outcome measures were completed and results explained to the patient:  LEFS: 16/80  72mWT (with RW): 0.35 m/s  5xSTS (with RW): 27.4 sec   Palpation: swelling surrounding knee, surgical site healing well, TTP anterior, medial, and lateral knee   Sensation: Light touch intact throughout,  Generalized numbness/tingling from knee down to foot   Gait Analysis: Dec L DF and knee extension with compensatory L hip hike. Pt increases WBing through BUEs during L stance phase. Increased R ant pelvic rotation during R swing phase to achieve adequate L hip E in LLE terminal stance.   Knee AROM in deg (L, R):  F: 82, 132  E: -9, -1        TREATMENT   Therapeutic Exercise:  Supine quad sets BLE with 5 second holds (added to HEP)  L SLR x10; challenging for pt and fatigue noted (added to  HEP)  Bil glute squeezes in supine with 3 seconds holds x10 (added to HEP)  Standing runner's stretch to L calf 3x30 seconds (added to HEP)   Gait Training:  Pt ambulated with RW x400 ft with cues for heel strike LLE at initial contact for improve mechanics.    Instructed pt to ice her L knee when she gets home from her appointment             PT Education - 11/12/16 1255    Education provided Yes   Education Details Provided pt with HEP; Role of PT; POC   Methods Explanation;Demonstration;Handout;Verbal cues   Comprehension Verbalized understanding;Returned demonstration;Need further instruction             PT Long Term Goals - 11/12/16 1304      PT LONG TERM GOAL #1   Title Pt will improve LEFS score by at least 9 points to demonstrate improved functional ability with LLE   Baseline 16/80   Time 6   Period Weeks   Status New     PT LONG TERM GOAL #2   Title Pt will demonstrate 5/5 L hip F, Abd, and Add strength for improved stability and functional use of LLE   Baseline Hip F: 4-/5, hip Abd: 4+/5, hip Add: 4-/5   Time 5   Period Weeks   Status New     PT LONG TERM GOAL #3   Title Pt will improve 47mWT to at least 1.0 m/s with use of AD to demonstrate gait speed   Baseline 0.35 m/s with RW   Time 8   Period Weeks   Status New     PT LONG TERM GOAL #4   Title Pt will improve 5xSTS to at least 12.4 sec to demonstrate improvement in balance and BLE strength    Baseline 27.4 sec     PT LONG TERM GOAL #5   Title L knee flexion and extension AROM will improve to WNL for improved mechanics with functional activities   Baseline Flexion: 82 deg, Extension: -9 deg   Time 6   Period Weeks   Status New               Plan - 11/12/16 1313    Clinical Impression Statement Pt presents following L medial menisectomy with limitations in L knee flexion and extension as expected following this surgery.  As a result of surgery pt demonstrates impaired gait mechanics with dec L knee F and DF with compensatory behaviors.  As demonstrated by her 5xSTS the pt presents with impaired BLE strength and balance.  She scored a 16/80 on her LEFS indicating functional limitations.  Her 6mWT time indicates a decreased gait speed and pt requires RW for support while ambulating due to instability and h/o L knee buckle.  The patient will benefit from skilled PT interventions to adress these impairments for improved QOL and decreased pain with goal of return to walking, gym activities, and work.   Rehab Potential Good   Clinical Impairments Affecting Rehab Potential (+) Pt with a very active lifstyle and motivation to return to PLOF (-) MD unable to repair 100% of damaged meniscal tissue   PT Frequency 2x / week   PT Duration 8 weeks   PT Treatment/Interventions ADLs/Self Care Home Management;Aquatic Therapy;Biofeedback;Cryotherapy;Electrical Stimulation;Iontophoresis 4mg /ml Dexamethasone;Moist Heat;Ultrasound;Contrast Bath;DME Instruction;Gait training;Stair training;Functional mobility training;Therapeutic activities;Therapeutic exercise;Balance training;Neuromuscular re-education;Patient/family education;Orthotic Fit/Training;Manual techniques;Compression bandaging;Scar mobilization;Passive range of motion;Dry needling;Energy conservation;Taping   PT Next Visit Plan Review HEP  and progress as appropriate.  Address limitations with L knee F/E.  Continue to address gait  mechanics.   PT Home Exercise Plan SLR, quad sets, runner's stretch, glute squeezes   Recommended Other Services none at this time   Consulted and Agree with Plan of Care Patient      Patient will benefit from skilled therapeutic intervention in order to improve the following deficits and impairments:  Abnormal gait, Decreased activity tolerance, Decreased balance, Decreased endurance, Decreased knowledge of precautions, Decreased knowledge of use of DME, Decreased mobility, Decreased range of motion, Decreased safety awareness, Decreased scar mobility, Decreased strength, Difficulty walking, Hypomobility, Increased edema, Increased fascial restricitons, Increased muscle spasms, Impaired perceived functional ability, Impaired flexibility, Impaired sensation, Improper body mechanics, Postural dysfunction, Pain  Visit Diagnosis: Left knee pain, unspecified chronicity  Other abnormalities of gait and mobility     Problem List Patient Active Problem List   Diagnosis Date Noted  . Pre-op examination 10/25/2016  . Bilateral knee pain 11/01/2015  . History of thrombocytopenia 11/01/2015  . AP (abdominal pain)   . Diverticulitis large intestine 10/01/2015     Collie Siad PT, DPT 11/12/2016, 1:27 PM  [place] [facility] [address], [place], [address] Phone: [phone]   Fax:  [phone]  Name: Rebecca Watkins MRN: [id number] Date of Birth: Nov 13, 1955

## 2016-11-14 ENCOUNTER — Ambulatory Visit: Payer: 59

## 2016-11-14 DIAGNOSIS — M25562 Pain in left knee: Secondary | ICD-10-CM | POA: Diagnosis not present

## 2016-11-14 DIAGNOSIS — R2689 Other abnormalities of gait and mobility: Secondary | ICD-10-CM

## 2016-11-14 NOTE — Therapy (Signed)
[facility] [address] [place], [address] Phone: [phone]   Fax:  [phone]  Physical Therapy Treatment  Patient Details  Name: Rebecca Watkins MRN: [id number] Date of Birth: 1956-08-02 Referring Provider: Thornton Park, MD  Encounter Date: 11/14/2016      PT End of Session - 11/14/16 0858    Visit Number 2   Number of Visits 17   Date for PT Re-Evaluation 01/07/17   PT Start Time 0850   PT Stop Time 0935   PT Time Calculation (min) 45 min   Equipment Utilized During Treatment Other (comment)  L knee brace   Activity Tolerance Patient tolerated treatment well   Behavior During Therapy [facility] for tasks assessed/performed      Past Medical History:  Diagnosis Date  . Diverticulitis   . PONV (postoperative nausea and vomiting)   . Temporary low platelet count (HCC)    H/O    Past Surgical History:  Procedure Laterality Date  . COLONOSCOPY WITH PROPOFOL N/A 12/15/2015   Procedure: COLONOSCOPY WITH PROPOFOL;  Surgeon: Manya Silvas, MD;  Location: [facility] ENDOSCOPY;  Service: Endoscopy;  Laterality: N/A;  . KNEE ARTHROSCOPY WITH MEDIAL MENISECTOMY Left 10/31/2016   Procedure: KNEE ARTHROSCOPY WITH MEDIAL MENISECTOMY;  Surgeon: Thornton Park, MD;  Location: [facility] ORS;  Service: Orthopedics;  Laterality: Left;  . TUBAL LIGATION      There were no vitals filed for this visit.      Subjective Assessment - 11/14/16 0857    Subjective Pt reports some increase soreness today in the medial apsect of her L knee. No sharp pain reported. HEP is going well without questions or concerns.    Pertinent History Pt is s/p tear of L medial meniscus s/p L knee arthroscopy with partial medial menisectomy, extensive synovecomy, removal of medial and suprapatella plica, chondroplasty of medial femoral condyle and undersurface of patella on 10/31/16. Pt reports that she is WBAT per her MD.  No protocol provided from  MD.  Pt is using polar ice each day for a few hours off and on with pain relief.  Pt reports she can fall asleep at night after finding a comfortable position but is awoken up to 20x/night due to pain and pins and needles in LLE and pt has a hard time finding a comfortable position after this.  Pins and needles associated with numbness down to her L foot which is intermittent. Patient's goals include: return to exercising again, walking again, and back to work.  Pt reports occasional L knee buckle and MD instructed pt to wear knee brace until this improved which pt presents with on Eval today. Pt is using RW at all times saying, "I don't feel confident not using it all the time". Still has stabbing pains on the medial side of her L knee which is intermittent, previously this was constant.  Pt denies any previous hip, knee, or ankle injuries to either LE.   Limitations Walking;Standing;House hold activities   Patient Stated Goals to return to work, walking, exercising   Currently in Pain? Yes   Pain Score 7    Pain Location Knee   Pain Orientation Left;Medial   Pain Descriptors / Indicators Sore   Pain Type Surgical pain   Pain Onset 1 to 4 weeks ago   Pain Frequency Constant   Multiple Pain Sites No              TREATMENT   Therapeutic Exercise  IFC applied at pt tolerated intensity to L knee during quad sets and SLR;    Supine quad sets LLE with 3-5 second holds 3 x 10; L SLR 3 x 10;  L SAQ 3 x 10; Bilateral bridges with bolster under knee (no WB forces through knee joint) R sidelying L hip abduction 3 x 10; Prone R hip extension 2 x 10; HEP review with patient;                   PT Education - 11/14/16 0858    Education provided Yes   Education Details HEP reinforced, added sidelying hip abduction   Person(s) Educated Patient   Methods Explanation   Comprehension Verbalized understanding             PT Long Term Goals - 11/12/16 1304      PT LONG  TERM GOAL #1   Title Pt will improve LEFS score by at least 9 points to demonstrate improved functional ability with LLE   Baseline 16/80   Time 6   Period Weeks   Status New     PT LONG TERM GOAL #2   Title Pt will demonstrate 5/5 L hip F, Abd, and Add strength for improved stability and functional use of LLE   Baseline Hip F: 4-/5, hip Abd: 4+/5, hip Add: 4-/5   Time 5   Period Weeks   Status New     PT LONG TERM GOAL #3   Title Pt will improve 69mWT to at least 1.0 m/s with use of AD to demonstrate gait speed   Baseline 0.35 m/s with RW   Time 8   Period Weeks   Status New     PT LONG TERM GOAL #4   Title Pt will improve 5xSTS to at least 12.4 sec to demonstrate improvement in balance and BLE strength   Baseline 27.4 sec     PT LONG TERM GOAL #5   Title L knee flexion and extension AROM will improve to WNL for improved mechanics with functional activities   Baseline Flexion: 82 deg, Extension: -9 deg   Time 6   Period Weeks   Status New               Plan - 11/14/16 2197    Clinical Impression Statement Pt demonstrates gradully improving L quad contraction with quad sets as well as with IFC over L knee during exercise. Minimal L quad lag noted with SLR. Add sidelying L hip abduction to HEP. Pt encouraged to continue additional exercises and follow-up as scheduled.    Rehab Potential Good   Clinical Impairments Affecting Rehab Potential (+) Pt with a very active lifstyle and motivation to return to PLOF (-) MD unable to repair 100% of damaged meniscal tissue   PT Frequency 2x / week   PT Duration 8 weeks   PT Treatment/Interventions ADLs/Self Care Home Management;Aquatic Therapy;Biofeedback;Cryotherapy;Electrical Stimulation;Iontophoresis 4mg /ml Dexamethasone;Moist Heat;Ultrasound;Contrast Bath;DME Instruction;Gait training;Stair training;Functional mobility training;Therapeutic activities;Therapeutic exercise;Balance training;Neuromuscular re-education;Patient/family  education;Orthotic Fit/Training;Manual techniques;Compression bandaging;Scar mobilization;Passive range of motion;Dry needling;Energy conservation;Taping   PT Next Visit Plan Review HEP and progress as appropriate.  Address limitations with L knee F/E.  Continue to address gait mechanics.   PT Home Exercise Plan SLR, quad sets, runner's stretch, glute squeezes, sidelying hip abduction   Consulted and Agree with Plan of Care Patient      Patient will benefit from skilled therapeutic intervention in order to improve the following deficits and impairments:  Abnormal  gait, Decreased activity tolerance, Decreased balance, Decreased endurance, Decreased knowledge of precautions, Decreased knowledge of use of DME, Decreased mobility, Decreased range of motion, Decreased safety awareness, Decreased scar mobility, Decreased strength, Difficulty walking, Hypomobility, Increased edema, Increased fascial restricitons, Increased muscle spasms, Impaired perceived functional ability, Impaired flexibility, Impaired sensation, Improper body mechanics, Postural dysfunction, Pain  Visit Diagnosis: Left knee pain, unspecified chronicity  Other abnormalities of gait and mobility     Problem List Patient Active Problem List   Diagnosis Date Noted  . Pre-op examination 10/25/2016  . Bilateral knee pain 11/01/2015  . History of thrombocytopenia 11/01/2015  . AP (abdominal pain)   . Diverticulitis large intestine 10/01/2015   Phillips Grout PT, DPT   Taneasha Fuqua 11/14/2016, 9:58 AM  [facility] [address], [place], [address] Phone: [phone]   Fax:  [phone]  Name: Cadynce Garrette MRN: [id number] Date of Birth: Aug 22, 1955

## 2016-11-19 ENCOUNTER — Ambulatory Visit: Payer: 59 | Attending: Orthopedic Surgery

## 2016-11-19 DIAGNOSIS — R2689 Other abnormalities of gait and mobility: Secondary | ICD-10-CM | POA: Insufficient documentation

## 2016-11-19 DIAGNOSIS — M25562 Pain in left knee: Secondary | ICD-10-CM | POA: Insufficient documentation

## 2016-11-19 NOTE — Therapy (Signed)
[facility] [address], [place], [address] Phone: [phone]   Fax:  [phone]  Physical Therapy Treatment  Patient Details  Name: Rebecca Watkins MRN: [id number] Date of Birth: 09/07/1955 Referring Provider: Thornton Park, MD  Encounter Date: 11/19/2016      PT End of Session - 11/19/16 1107    Visit Number 3   Number of Visits 17   Date for PT Re-Evaluation 01/07/17   PT Start Time 0935   PT Stop Time 1015   PT Time Calculation (min) 40 min   Equipment Utilized During Treatment Other (comment)  L knee brace off entire session   Activity Tolerance Patient tolerated treatment well   Behavior During Therapy [facility] for tasks assessed/performed      Past Medical History:  Diagnosis Date  . Diverticulitis   . PONV (postoperative nausea and vomiting)   . Temporary low platelet count (HCC)    H/O    Past Surgical History:  Procedure Laterality Date  . COLONOSCOPY WITH PROPOFOL N/A 12/15/2015   Procedure: COLONOSCOPY WITH PROPOFOL;  Surgeon: Manya Silvas, MD;  Location: [facility] ENDOSCOPY;  Service: Endoscopy;  Laterality: N/A;  . KNEE ARTHROSCOPY WITH MEDIAL MENISECTOMY Left 10/31/2016   Procedure: KNEE ARTHROSCOPY WITH MEDIAL MENISECTOMY;  Surgeon: Thornton Park, MD;  Location: [facility] ORS;  Service: Orthopedics;  Laterality: Left;  . TUBAL LIGATION      There were no vitals filed for this visit.      Subjective Assessment - 11/19/16 0935    Subjective Pt reports some increased soreness today in the medial apsect of her L knee. No sharp pain reported. Does not rate on NPRS. HEP is going well without questions or concerns. She states she was appropriately sore after her last therapy session.    Pertinent History Pt is s/p tear of L medial meniscus s/p L knee arthroscopy with partial medial menisectomy, extensive synovecomy, removal of medial and suprapatella plica, chondroplasty of medial femoral  condyle and undersurface of patella on 10/31/16. Pt reports that she is WBAT per her MD.  No protocol provided from MD.  Pt is using polar ice each day for a few hours off and on with pain relief.  Pt reports she can fall asleep at night after finding a comfortable position but is awoken up to 20x/night due to pain and pins and needles in LLE and pt has a hard time finding a comfortable position after this.  Pins and needles associated with numbness down to her L foot which is intermittent. Patient's goals include: return to exercising again, walking again, and back to work.  Pt reports occasional L knee buckle and MD instructed pt to wear knee brace until this improved which pt presents with on Eval today. Pt is using RW at all times saying, "I don't feel confident not using it all the time". Still has stabbing pains on the medial side of her L knee which is intermittent, previously this was constant.  Pt denies any previous hip, knee, or ankle injuries to either LE.   Limitations Walking;Standing;House hold activities   Patient Stated Goals to return to work, walking, exercising   Currently in Pain? No/denies  Only soreness reported   Pain Onset --           TREATMENT   Therapeutic Exercise  IFC applied at pt tolerated intensity to L knee during quad sets and SLR;    Supine quad sets LLE with 3-5  second holds 3 x 10; L SLR 3 x 10, setting knee to full extension with quad set prior to lift;  Bilateral bridges 3 x 10, pain-free (added to HEP); R sidelying L hip abduction 3 x 10; Seated LAQ 2 x 10; Standing L hip abduction with yellow tband above knees 2 x 10 (added to HEP); Standing L knee terminal extension with yellow tband resistance 2 x 10 (added to HEP); HEP progression review with patient,handout provided;                      PT Education - 11/19/16 1107    Education provided Yes   Education Details HEP progressed   Person(s) Educated Patient   Methods  Explanation   Comprehension Verbalized understanding             PT Long Term Goals - 11/12/16 1304      PT LONG TERM GOAL #1   Title Pt will improve LEFS score by at least 9 points to demonstrate improved functional ability with LLE   Baseline 16/80   Time 6   Period Weeks   Status New     PT LONG TERM GOAL #2   Title Pt will demonstrate 5/5 L hip F, Abd, and Add strength for improved stability and functional use of LLE   Baseline Hip F: 4-/5, hip Abd: 4+/5, hip Add: 4-/5   Time 5   Period Weeks   Status New     PT LONG TERM GOAL #3   Title Pt will improve 36mWT to at least 1.0 m/s with use of AD to demonstrate gait speed   Baseline 0.35 m/s with RW   Time 8   Period Weeks   Status New     PT LONG TERM GOAL #4   Title Pt will improve 5xSTS to at least 12.4 sec to demonstrate improvement in balance and BLE strength   Baseline 27.4 sec     PT LONG TERM GOAL #5   Title L knee flexion and extension AROM will improve to WNL for improved mechanics with functional activities   Baseline Flexion: 82 deg, Extension: -9 deg   Time 6   Period Weeks   Status New               Plan - 11/19/16 1018    Clinical Impression Statement Pt reports no pain with all exercises during session. Review of operative report does not indicate any meniscus repair only minesectomy. Pt reports that Dr. Mack Guise told her that he performed both a menisectomy and partial repair of her meniscus. Pt had no WB restrictions post-operatively. She denies pain with all WB positions such as bridges and standing open chain LLE exercises. HEP progressed on this date. Therapist will attempt to contact orthopedic surgeon to get more details regarding specfics of her surgery.    Rehab Potential Good   Clinical Impairments Affecting Rehab Potential (+) Pt with a very active lifstyle and motivation to return to PLOF (-) MD unable to repair 100% of damaged meniscal tissue   PT Frequency 2x / week   PT  Duration 8 weeks   PT Treatment/Interventions ADLs/Self Care Home Management;Aquatic Therapy;Biofeedback;Cryotherapy;Electrical Stimulation;Iontophoresis 4mg /ml Dexamethasone;Moist Heat;Ultrasound;Contrast Bath;DME Instruction;Gait training;Stair training;Functional mobility training;Therapeutic activities;Therapeutic exercise;Balance training;Neuromuscular re-education;Patient/family education;Orthotic Fit/Training;Manual techniques;Compression bandaging;Scar mobilization;Passive range of motion;Dry needling;Energy conservation;Taping   PT Next Visit Plan Review HEP and progress as appropriate.  Address limitations with L knee F/E.  Continue to address gait mechanics.  PT Home Exercise Plan runner's stretch, ytband resisted L hip abduction, ytband resisted L TKE in standing, hooklying bridges   Consulted and Agree with Plan of Care Patient      Patient will benefit from skilled therapeutic intervention in order to improve the following deficits and impairments:  Abnormal gait, Decreased activity tolerance, Decreased balance, Decreased endurance, Decreased knowledge of precautions, Decreased knowledge of use of DME, Decreased mobility, Decreased range of motion, Decreased safety awareness, Decreased scar mobility, Decreased strength, Difficulty walking, Hypomobility, Increased edema, Increased fascial restricitons, Increased muscle spasms, Impaired perceived functional ability, Impaired flexibility, Impaired sensation, Improper body mechanics, Postural dysfunction, Pain  Visit Diagnosis: Left knee pain, unspecified chronicity  Other abnormalities of gait and mobility     Problem List Patient Active Problem List   Diagnosis Date Noted  . Pre-op examination 10/25/2016  . Bilateral knee pain 11/01/2015  . History of thrombocytopenia 11/01/2015  . AP (abdominal pain)   . Diverticulitis large intestine 10/01/2015   Phillips Grout PT, DPT   Reannah Totten 11/19/2016, 11:24 AM  [place] [facility] [address], [place], [address] Phone: [phone]   Fax:  [phone]  Name: Rebecca Watkins MRN: [id number] Date of Birth: [birth year]

## 2016-11-21 ENCOUNTER — Ambulatory Visit: Payer: 59

## 2016-11-21 DIAGNOSIS — M25562 Pain in left knee: Secondary | ICD-10-CM

## 2016-11-21 DIAGNOSIS — R2689 Other abnormalities of gait and mobility: Secondary | ICD-10-CM

## 2016-11-21 NOTE — Therapy (Signed)
[facility] [address], [place], [address] Phone: [phone]   Fax:  [phone]  Physical Therapy Treatment  Patient Details  Name: Rebecca Watkins MRN: [id number] Date of Birth: 05/17/1956 Referring Provider: Thornton Park, MD  Encounter Date: 11/21/2016      PT End of Session - 11/21/16 1130    Visit Number 4   Number of Visits 17   Date for PT Re-Evaluation 01/07/17   PT Start Time 1120   PT Stop Time 1205   PT Time Calculation (min) 45 min   Equipment Utilized During Treatment Other (comment)  L knee brace off entire session   Activity Tolerance Patient tolerated treatment well   Behavior During Therapy [facility] for tasks assessed/performed      Past Medical History:  Diagnosis Date  . Diverticulitis   . PONV (postoperative nausea and vomiting)   . Temporary low platelet count (HCC)    H/O    Past Surgical History:  Procedure Laterality Date  . COLONOSCOPY WITH PROPOFOL N/A 12/15/2015   Procedure: COLONOSCOPY WITH PROPOFOL;  Surgeon: Manya Silvas, MD;  Location: [facility] ENDOSCOPY;  Service: Endoscopy;  Laterality: N/A;  . KNEE ARTHROSCOPY WITH MEDIAL MENISECTOMY Left 10/31/2016   Procedure: KNEE ARTHROSCOPY WITH MEDIAL MENISECTOMY;  Surgeon: Thornton Park, MD;  Location: [facility] ORS;  Service: Orthopedics;  Laterality: Left;  . TUBAL LIGATION      There were no vitals filed for this visit.      Subjective Assessment - 11/21/16 1117    Subjective Pt continues to report increased soreness today. She states that she had increased soreness the night after the last therapy session and the following day. She has not been able to perform HEP due to soreness. No specific questions at this time.    Pertinent History Pt is s/p tear of L medial meniscus s/p L knee arthroscopy with partial medial menisectomy, extensive synovecomy, removal of medial and suprapatella plica, chondroplasty of medial femoral  condyle and undersurface of patella on 10/31/16. Pt reports that she is WBAT per her MD.  No protocol provided from MD.  Pt is using polar ice each day for a few hours off and on with pain relief.  Pt reports she can fall asleep at night after finding a comfortable position but is awoken up to 20x/night due to pain and pins and needles in LLE and pt has a hard time finding a comfortable position after this.  Pins and needles associated with numbness down to her L foot which is intermittent. Patient's goals include: return to exercising again, walking again, and back to work.  Pt reports occasional L knee buckle and MD instructed pt to wear knee brace until this improved which pt presents with on Eval today. Pt is using RW at all times saying, "I don't feel confident not using it all the time". Still has stabbing pains on the medial side of her L knee which is intermittent, previously this was constant.  Pt denies any previous hip, knee, or ankle injuries to either LE.   Limitations Walking;Standing;House hold activities   Patient Stated Goals to return to work, walking, exercising   Currently in Pain? Yes   Pain Score 4   with walking   Pain Location Knee   Pain Orientation Left;Medial;Lateral   Pain Descriptors / Indicators Throbbing   Pain Type Surgical pain   Pain Onset 1 to 4 weeks ago   Pain Frequency Intermittent  TREATMENT   Therapeutic Exercise  IFC applied at pt tolerated intensity to L knee during quad sets and SLR;    Supine quad sets LLE with 3-5 second holds 3 x 10; L SLR 3 x 10;  L SAQ 3 x 10; Bilateral bridges with bolster under knee (no WB forces through knee joint); R sidelying L hip abduction 3 x 10; HEP review with patient;  Manual Therapy Supine L patella mobilizations superior/inferior as well as medial/lateral grade II-III 30s/bouts x 3 bouts each; STM with instrument assist to distal L quadricep muscle, minimal pain reported by  patient;                   PT Education - 11/21/16 1129    Education provided Yes   Education Details HEP regression temporarily   Person(s) Educated Patient   Methods Explanation   Comprehension Verbalized understanding             PT Long Term Goals - 11/12/16 1304      PT LONG TERM GOAL #1   Title Pt will improve LEFS score by at least 9 points to demonstrate improved functional ability with LLE   Baseline 16/80   Time 6   Period Weeks   Status New     PT LONG TERM GOAL #2   Title Pt will demonstrate 5/5 L hip F, Abd, and Add strength for improved stability and functional use of LLE   Baseline Hip F: 4-/5, hip Abd: 4+/5, hip Add: 4-/5   Time 5   Period Weeks   Status New     PT LONG TERM GOAL #3   Title Pt will improve 38mWT to at least 1.0 m/s with use of AD to demonstrate gait speed   Baseline 0.35 m/s with RW   Time 8   Period Weeks   Status New     PT LONG TERM GOAL #4   Title Pt will improve 5xSTS to at least 12.4 sec to demonstrate improvement in balance and BLE strength   Baseline 27.4 sec     PT LONG TERM GOAL #5   Title L knee flexion and extension AROM will improve to WNL for improved mechanics with functional activities   Baseline Flexion: 82 deg, Extension: -9 deg   Time 6   Period Weeks   Status New               Plan - 11/21/16 1130    Clinical Impression Statement Pt reporting some increased L knee pain today and over the last couple days so therapy session modified to decrease intensity of exercises. Focused session today on open chain exercises and manual therapy. Pt encouraged to regress her HEP to the original exercises including quad/glut sets, SLR, and sidelying hip abduction. Will assess patient's response and progress exercises as appropriate at next session.   Rehab Potential Good   Clinical Impairments Affecting Rehab Potential (+) Pt with a very active lifstyle and motivation to return to PLOF (-) MD unable to  repair 100% of damaged meniscal tissue   PT Frequency 2x / week   PT Duration 8 weeks   PT Treatment/Interventions ADLs/Self Care Home Management;Aquatic Therapy;Biofeedback;Cryotherapy;Electrical Stimulation;Iontophoresis 4mg /ml Dexamethasone;Moist Heat;Ultrasound;Contrast Bath;DME Instruction;Gait training;Stair training;Functional mobility training;Therapeutic activities;Therapeutic exercise;Balance training;Neuromuscular re-education;Patient/family education;Orthotic Fit/Training;Manual techniques;Compression bandaging;Scar mobilization;Passive range of motion;Dry needling;Energy conservation;Taping   PT Next Visit Plan Assess response to HEP regression. Address limitations with L knee F/E.  Continue to address gait mechanics.   PT Home  Exercise Plan runner's stretch, ytband resisted L hip abduction, ytband resisted L TKE in standing, hooklying bridges (Regression: quad sets, glut sets, sidelying hip abduction, b   Consulted and Agree with Plan of Care Patient      Patient will benefit from skilled therapeutic intervention in order to improve the following deficits and impairments:  Abnormal gait, Decreased activity tolerance, Decreased balance, Decreased endurance, Decreased knowledge of precautions, Decreased knowledge of use of DME, Decreased mobility, Decreased range of motion, Decreased safety awareness, Decreased scar mobility, Decreased strength, Difficulty walking, Hypomobility, Increased edema, Increased fascial restricitons, Increased muscle spasms, Impaired perceived functional ability, Impaired flexibility, Impaired sensation, Improper body mechanics, Postural dysfunction, Pain  Visit Diagnosis: Left knee pain, unspecified chronicity  Other abnormalities of gait and mobility     Problem List Patient Active Problem List   Diagnosis Date Noted  . Pre-op examination 10/25/2016  . Bilateral knee pain 11/01/2015  . History of thrombocytopenia 11/01/2015  . AP (abdominal pain)    . Diverticulitis large intestine 10/01/2015   Phillips Grout PT, DPT   Huprich,Jason 11/21/2016, 1:32 PM  [place] [facility] [address], [place], [address] Phone: [phone]   Fax:  [phone]  Name: Rebecca Watkins MRN: [id number] Date of Birth: 04-18-1956

## 2016-11-26 ENCOUNTER — Ambulatory Visit: Payer: 59

## 2016-11-26 DIAGNOSIS — R2689 Other abnormalities of gait and mobility: Secondary | ICD-10-CM

## 2016-11-26 DIAGNOSIS — M25562 Pain in left knee: Secondary | ICD-10-CM | POA: Diagnosis not present

## 2016-11-26 NOTE — Therapy (Signed)
[facility] [address], [place], [address] Phone: [phone]   Fax:  [phone]  Physical Therapy Treatment  Patient Details  Name: Rebecca Watkins MRN: [id number] Date of Birth: Nov 18, 1955 Referring Provider: Thornton Park, MD  Encounter Date: 11/26/2016      PT End of Session - 11/26/16 0909    Visit Number 5   Number of Visits 17   Date for PT Re-Evaluation 01/07/17   PT Start Time 0912   PT Stop Time 1000   PT Time Calculation (min) 48 min   Equipment Utilized During Treatment Other (comment)  L knee brace off entire session   Activity Tolerance Patient tolerated treatment well   Behavior During Therapy [facility] for tasks assessed/performed      Past Medical History:  Diagnosis Date  . Diverticulitis   . PONV (postoperative nausea and vomiting)   . Temporary low platelet count (HCC)    H/O    Past Surgical History:  Procedure Laterality Date  . COLONOSCOPY WITH PROPOFOL N/A 12/15/2015   Procedure: COLONOSCOPY WITH PROPOFOL;  Surgeon: Manya Silvas, MD;  Location: [facility] ENDOSCOPY;  Service: Endoscopy;  Laterality: N/A;  . KNEE ARTHROSCOPY WITH MEDIAL MENISECTOMY Left 10/31/2016   Procedure: KNEE ARTHROSCOPY WITH MEDIAL MENISECTOMY;  Surgeon: Thornton Park, MD;  Location: [facility] ORS;  Service: Orthopedics;  Laterality: Left;  . TUBAL LIGATION      There were no vitals filed for this visit.      Subjective Assessment - 11/26/16 0909    Subjective Pt reports increased irritation in medial part of L knee since Friday. She states that she has 10/10 sharp pain when she first gets up at night. She is very concerned that "I did something to my knee." Pt denies any change in activity or trauma to her knee. No pain upon arrival at this time.    Pertinent History Pt is s/p tear of L medial meniscus s/p L knee arthroscopy with partial medial menisectomy, extensive synovecomy, removal of medial and  suprapatella plica, chondroplasty of medial femoral condyle and undersurface of patella on 10/31/16. Pt reports that she is WBAT per her MD.  No protocol provided from MD.  Pt is using polar ice each day for a few hours off and on with pain relief.  Pt reports she can fall asleep at night after finding a comfortable position but is awoken up to 20x/night due to pain and pins and needles in LLE and pt has a hard time finding a comfortable position after this.  Pins and needles associated with numbness down to her L foot which is intermittent. Patient's goals include: return to exercising again, walking again, and back to work.  Pt reports occasional L knee buckle and MD instructed pt to wear knee brace until this improved which pt presents with on Eval today. Pt is using RW at all times saying, "I don't feel confident not using it all the time". Still has stabbing pains on the medial side of her L knee which is intermittent, previously this was constant.  Pt denies any previous hip, knee, or ankle injuries to either LE.   Limitations Walking;Standing;House hold activities   Patient Stated Goals to return to work, walking, exercising   Currently in Pain? No/denies          TREATMENT   Therapeutic Exercise  Supine quad sets LLE with 3-5 second holds x 10; L SLR x 10; L single leg bridges 3  x 10; R sidelying L hip abduction 3 x 10 with manual resistance; L LAQ 3 x 10 with manual resistance;  Manual Therapy Review of symptoms and palpation of knee. Pt with pain with palpation over L pes anserine extending superiorly toward medial tibial plateau. L tibia on femur AP and PA mobilizations in hooklying position for pain control grade II, 30s/bout x 3 bouts each; Supine L patella mobilizations superior/inferior as well as medial/lateral grade II-III 30s/bouts x 3 bouts each; STM with instrument assist over L pes anserine extending superiorly over knee joint with focus on gracilis muscle;    Iontophoresis Applied iontophoresis patch over L pes anserine with dexamethasone prior to patient leaving. Time includes preparation as well as extensive patient education regarding wear time. Education handout provided to patient;                       PT Education - 11/26/16 0909    Education provided Yes   Education Details Ice, anti-inflammatories as prescribed, plan of care   Person(s) Educated Patient   Methods Explanation   Comprehension Verbalized understanding             PT Long Term Goals - 11/12/16 1304      PT LONG TERM GOAL #1   Title Pt will improve LEFS score by at least 9 points to demonstrate improved functional ability with LLE   Baseline 16/80   Time 6   Period Weeks   Status New     PT LONG TERM GOAL #2   Title Pt will demonstrate 5/5 L hip F, Abd, and Add strength for improved stability and functional use of LLE   Baseline Hip F: 4-/5, hip Abd: 4+/5, hip Add: 4-/5   Time 5   Period Weeks   Status New     PT LONG TERM GOAL #3   Title Pt will improve 24mWT to at least 1.0 m/s with use of AD to demonstrate gait speed   Baseline 0.35 m/s with RW   Time 8   Period Weeks   Status New     PT LONG TERM GOAL #4   Title Pt will improve 5xSTS to at least 12.4 sec to demonstrate improvement in balance and BLE strength   Baseline 27.4 sec     PT LONG TERM GOAL #5   Title L knee flexion and extension AROM will improve to WNL for improved mechanics with functional activities   Baseline Flexion: 82 deg, Extension: -9 deg   Time 6   Period Weeks   Status New               Plan - 11/26/16 0910    Clinical Impression Statement Pt complaining of intermittent sharp medial L knee pain which started Friday. No trauma or falls reported. She has been using her brace continuously since that time. Pt with increased pain to palpation over her L pes anserine and extending superiorly toward her medial tibial plateau. STM performed to this  area as well as extending up medial thigh along gracilis muscle. Progressed bridges to left single leg today. Encouraged pt to continue current HEP until next session at which time it will be progressed back to include weight bearing exercises. Ice aggressively and use antiinflammatories as prescribed per MD recommendation. Utilized iontophoresis today with dexamethasone. Will assess symptom response at next session.  Follow-up as scheduled.    Rehab Potential Good   Clinical Impairments Affecting Rehab Potential (+) Pt  with a very active lifstyle and motivation to return to PLOF (-) MD unable to repair 100% of damaged meniscal tissue   PT Frequency 2x / week   PT Duration 8 weeks   PT Treatment/Interventions ADLs/Self Care Home Management;Aquatic Therapy;Biofeedback;Cryotherapy;Electrical Stimulation;Iontophoresis 4mg /ml Dexamethasone;Moist Heat;Ultrasound;Contrast Bath;DME Instruction;Gait training;Stair training;Functional mobility training;Therapeutic activities;Therapeutic exercise;Balance training;Neuromuscular re-education;Patient/family education;Orthotic Fit/Training;Manual techniques;Compression bandaging;Scar mobilization;Passive range of motion;Dry needling;Energy conservation;Taping   PT Next Visit Plan Assess response to ionto. Address limitations with L knee F/E.  Continue to address gait mechanics.   PT Home Exercise Plan runner's stretch, ytband resisted L hip abduction, ytband resisted L TKE in standing, hooklying bridges (Regression: quad sets, glut sets, sidelying hip abduction)   Consulted and Agree with Plan of Care Patient      Patient will benefit from skilled therapeutic intervention in order to improve the following deficits and impairments:  Abnormal gait, Decreased activity tolerance, Decreased balance, Decreased endurance, Decreased knowledge of precautions, Decreased knowledge of use of DME, Decreased mobility, Decreased range of motion, Decreased safety awareness, Decreased  scar mobility, Decreased strength, Difficulty walking, Hypomobility, Increased edema, Increased fascial restricitons, Increased muscle spasms, Impaired perceived functional ability, Impaired flexibility, Impaired sensation, Improper body mechanics, Postural dysfunction, Pain  Visit Diagnosis: Left knee pain, unspecified chronicity  Other abnormalities of gait and mobility     Problem List Patient Active Problem List   Diagnosis Date Noted  . Pre-op examination 10/25/2016  . Bilateral knee pain 11/01/2015  . History of thrombocytopenia 11/01/2015  . AP (abdominal pain)   . Diverticulitis large intestine 10/01/2015    Phillips Grout PT, DPT   Emer Onnen 11/26/2016, 10:37 AM  [place] [facility] [address] [place], [address] Phone: [phone]   Fax:  [phone]  Name: Ariadne Rissmiller MRN: [id number] Date of Birth: 09/06/1955

## 2016-11-28 ENCOUNTER — Ambulatory Visit: Payer: 59

## 2016-11-28 DIAGNOSIS — R2689 Other abnormalities of gait and mobility: Secondary | ICD-10-CM

## 2016-11-28 DIAGNOSIS — M25562 Pain in left knee: Secondary | ICD-10-CM | POA: Diagnosis not present

## 2016-11-28 NOTE — Therapy (Signed)
[place] [facility] [address] [place], [address] Phone: [phone]   Fax:  [phone]  Physical Therapy Treatment  Patient Details  Name: Rebecca Watkins MRN: [id number] Date of Birth: 1956/07/10 Referring Provider: Thornton Park, MD  Encounter Date: 11/28/2016      PT End of Session - 11/28/16 1119    Visit Number 6   Number of Visits 17   Date for PT Re-Evaluation 01/07/17   PT Start Time 1116   PT Stop Time 1205   PT Time Calculation (min) 49 min   Equipment Utilized During Treatment Other (comment)  L knee brace off entire session   Activity Tolerance Patient tolerated treatment well   Behavior During Therapy [facility] for tasks assessed/performed      Past Medical History:  Diagnosis Date  . Diverticulitis   . PONV (postoperative nausea and vomiting)   . Temporary low platelet count (HCC)    H/O    Past Surgical History:  Procedure Laterality Date  . COLONOSCOPY WITH PROPOFOL N/A 12/15/2015   Procedure: COLONOSCOPY WITH PROPOFOL;  Surgeon: Manya Silvas, MD;  Location: [facility] ENDOSCOPY;  Service: Endoscopy;  Laterality: N/A;  . KNEE ARTHROSCOPY WITH MEDIAL MENISECTOMY Left 10/31/2016   Procedure: KNEE ARTHROSCOPY WITH MEDIAL MENISECTOMY;  Surgeon: Thornton Park, MD;  Location: [facility] ORS;  Service: Orthopedics;  Laterality: Left;  . TUBAL LIGATION      There were no vitals filed for this visit.      Subjective Assessment - 11/28/16 1119    Subjective Pt reports she is doing well ont his date. Sharp medial knee pain has improved and she states that the evening following her last therapy session she felt much better. No specific questions or concerns at this time.    Pertinent History Pt is s/p tear of L medial meniscus s/p L knee arthroscopy with partial medial menisectomy, extensive synovecomy, removal of medial and suprapatella plica, chondroplasty of medial femoral condyle and undersurface of  patella on 10/31/16. Pt reports that she is WBAT per her MD.  No protocol provided from MD.  Pt is using polar ice each day for a few hours off and on with pain relief.  Pt reports she can fall asleep at night after finding a comfortable position but is awoken up to 20x/night due to pain and pins and needles in LLE and pt has a hard time finding a comfortable position after this.  Pins and needles associated with numbness down to her L foot which is intermittent. Patient's goals include: return to exercising again, walking again, and back to work.  Pt reports occasional L knee buckle and MD instructed pt to wear knee brace until this improved which pt presents with on Eval today. Pt is using RW at all times saying, "I don't feel confident not using it all the time". Still has stabbing pains on the medial side of her L knee which is intermittent, previously this was constant.  Pt denies any previous hip, knee, or ankle injuries to either LE.   Limitations Walking;Standing;House hold activities   Patient Stated Goals to return to work, walking, exercising   Currently in Pain? No/denies           TREATMENT   Therapeutic Exercise  L SLR x 10; L single leg bridges x 10; R sidelying L hip abduction x 10; L LAQ 3 x 10 with manual resistance; Prone L quad stretch 30s x 3, notable restriction in quad  length noted in LLE compared to RLE; Standing L knee terminal extension with green tband resistance 2 x 10  Standing L hip abduction with yellow tband above knees 2 x 10; Standing mini squats 2 x 10 with extensive correction provided for form; HEP progression review with patient handout provided;  Knee ROM measures obtained: -2 to 114 LLE, 0-135 RLE  Manual Therapy L tibia on femur AP and PA mobilizations in hooklying position for pain control grade I, 30s/bout x 3 bouts each; Supine L patella mobilizations superior/inferior as well as medial/lateral grade II-III 30s/bouts x 3 bouts each; STM with  instrument assist over L pes anserine extending superiorly over knee joint with focus on gracilis muscle;   Iontophoresis Applied iontophoresis patch over L pes anserine with dexamethasone prior to end of session. Time includes preparation as well as patient education;                       PT Education - 11/28/16 1119    Education provided Yes   Education Details HEP progression   Person(s) Educated Patient   Methods Explanation   Comprehension Verbalized understanding             PT Long Term Goals - 11/12/16 1304      PT LONG TERM GOAL #1   Title Pt will improve LEFS score by at least 9 points to demonstrate improved functional ability with LLE   Baseline 16/80   Time 6   Period Weeks   Status New     PT LONG TERM GOAL #2   Title Pt will demonstrate 5/5 L hip F, Abd, and Add strength for improved stability and functional use of LLE   Baseline Hip F: 4-/5, hip Abd: 4+/5, hip Add: 4-/5   Time 5   Period Weeks   Status New     PT LONG TERM GOAL #3   Title Pt will improve 28mWT to at least 1.0 m/s with use of AD to demonstrate gait speed   Baseline 0.35 m/s with RW   Time 8   Period Weeks   Status New     PT LONG TERM GOAL #4   Title Pt will improve 5xSTS to at least 12.4 sec to demonstrate improvement in balance and BLE strength   Baseline 27.4 sec     PT LONG TERM GOAL #5   Title L knee flexion and extension AROM will improve to WNL for improved mechanics with functional activities   Baseline Flexion: 82 deg, Extension: -9 deg   Time 6   Period Weeks   Status New               Plan - 11/28/16 1429    Clinical Impression Statement Pt encouraged to progress HEP at this time. Provided additional exercises for progression which include standing TKE, squats, prone quadriceps stretch, and standing hip abduction. Continue single leg bridges. Pt encouraged to follow-up as scheduled.    Rehab Potential Good   Clinical Impairments Affecting  Rehab Potential (+) Pt with a very active lifstyle and motivation to return to PLOF (-) MD unable to repair 100% of damaged meniscal tissue   PT Frequency 2x / week   PT Duration 8 weeks   PT Treatment/Interventions ADLs/Self Care Home Management;Aquatic Therapy;Biofeedback;Cryotherapy;Electrical Stimulation;Iontophoresis 4mg /ml Dexamethasone;Moist Heat;Ultrasound;Contrast Bath;DME Instruction;Gait training;Stair training;Functional mobility training;Therapeutic activities;Therapeutic exercise;Balance training;Neuromuscular re-education;Patient/family education;Orthotic Fit/Training;Manual techniques;Compression bandaging;Scar mobilization;Passive range of motion;Dry needling;Energy conservation;Taping   PT Next Visit Plan Manual therapy,  stretching, strengthening, ionto if it continues to help.   PT Home Exercise Plan runner's stretch, ytband resisted L hip abduction, ytband resisted L TKE in standing, L single leg bridge, standing squats, prone quad stretch; (Regression: quad sets, glut sets, sidelying hip abduction)   Consulted and Agree with Plan of Care Patient      Patient will benefit from skilled therapeutic intervention in order to improve the following deficits and impairments:  Abnormal gait, Decreased activity tolerance, Decreased balance, Decreased endurance, Decreased knowledge of precautions, Decreased knowledge of use of DME, Decreased mobility, Decreased range of motion, Decreased safety awareness, Decreased scar mobility, Decreased strength, Difficulty walking, Hypomobility, Increased edema, Increased fascial restricitons, Increased muscle spasms, Impaired perceived functional ability, Impaired flexibility, Impaired sensation, Improper body mechanics, Postural dysfunction, Pain  Visit Diagnosis: Left knee pain, unspecified chronicity  Other abnormalities of gait and mobility     Problem List Patient Active Problem List   Diagnosis Date Noted  . Pre-op examination 10/25/2016   . Bilateral knee pain 11/01/2015  . History of thrombocytopenia 11/01/2015  . AP (abdominal pain)   . Diverticulitis large intestine 10/01/2015   Phillips Grout PT, DPT   Huprich,Jason 11/28/2016, 2:58 PM  [place] [facility] [address] [place], [address] Phone: [phone]   Fax:  [phone]  Name: Charletta Voight MRN: [id number] Date of Birth: 1955-10-13

## 2016-11-29 DIAGNOSIS — M6752 Plica syndrome, left knee: Secondary | ICD-10-CM | POA: Diagnosis not present

## 2016-11-29 DIAGNOSIS — M94262 Chondromalacia, left knee: Secondary | ICD-10-CM | POA: Diagnosis not present

## 2016-11-29 DIAGNOSIS — M65862 Other synovitis and tenosynovitis, left lower leg: Secondary | ICD-10-CM | POA: Diagnosis not present

## 2016-11-29 DIAGNOSIS — M23232 Derangement of other medial meniscus due to old tear or injury, left knee: Secondary | ICD-10-CM | POA: Diagnosis not present

## 2016-12-03 ENCOUNTER — Ambulatory Visit: Payer: 59

## 2016-12-03 ENCOUNTER — Encounter: Payer: Self-pay | Admitting: Physical Therapy

## 2016-12-03 DIAGNOSIS — M25562 Pain in left knee: Secondary | ICD-10-CM | POA: Diagnosis not present

## 2016-12-03 DIAGNOSIS — R2689 Other abnormalities of gait and mobility: Secondary | ICD-10-CM | POA: Diagnosis not present

## 2016-12-03 NOTE — Therapy (Signed)
[place] [facility] [address], [place], [address] Phone: [phone]   Fax:  [phone]  Physical Therapy Treatment  Patient Details  Name: Rebecca Watkins MRN: [id number] Date of Birth: [birth year] Referring Provider: Thornton Park, MD  Encounter Date: 12/03/2016      PT End of Session - 12/03/16 0906    Visit Number 7   Number of Visits 17   Date for PT Re-Evaluation 01/07/17   PT Start Time 0910   PT Stop Time 0955   PT Time Calculation (min) 45 min   Equipment Utilized During Treatment Other (comment)  L knee brace off entire session   Activity Tolerance Patient tolerated treatment well   Behavior During Therapy [facility] for tasks assessed/performed      Past Medical History:  Diagnosis Date  . Diverticulitis   . PONV (postoperative nausea and vomiting)   . Temporary low platelet count (HCC)    H/O    Past Surgical History:  Procedure Laterality Date  . COLONOSCOPY WITH PROPOFOL N/A 12/15/2015   Procedure: COLONOSCOPY WITH PROPOFOL;  Surgeon: Manya Silvas, MD;  Location: [facility] ENDOSCOPY;  Service: Endoscopy;  Laterality: N/A;  . KNEE ARTHROSCOPY WITH MEDIAL MENISECTOMY Left 10/31/2016   Procedure: KNEE ARTHROSCOPY WITH MEDIAL MENISECTOMY;  Surgeon: Thornton Park, MD;  Location: [facility] ORS;  Service: Orthopedics;  Laterality: Left;  . TUBAL LIGATION      There were no vitals filed for this visit.      Subjective Assessment - 12/03/16 0906    Subjective Pt reports she is doing well ont his date. She continues to have soreness but no sharp knee pain. She is performing HEP without increase in pain. She saw Dr. Mack Guise and he was pleased with her progress. He gave her a steroid injection in her medial L knee. Pt believes that this has helped with the pain.    Pertinent History Pt is s/p tear of L medial meniscus s/p L knee arthroscopy with partial medial menisectomy, extensive synovecomy, removal of  medial and suprapatella plica, chondroplasty of medial femoral condyle and undersurface of patella on 10/31/16. Pt reports that she is WBAT per her MD.  No protocol provided from MD.  Pt is using polar ice each day for a few hours off and on with pain relief.  Pt reports she can fall asleep at night after finding a comfortable position but is awoken up to 20x/night due to pain and pins and needles in LLE and pt has a hard time finding a comfortable position after this.  Pins and needles associated with numbness down to her L foot which is intermittent. Patient's goals include: return to exercising again, walking again, and back to work.  Pt reports occasional L knee buckle and MD instructed pt to wear knee brace until this improved which pt presents with on Eval today. Pt is using RW at all times saying, "I don't feel confident not using it all the time". Still has stabbing pains on the medial side of her L knee which is intermittent, previously this was constant.  Pt denies any previous hip, knee, or ankle injuries to either LE.   Limitations Walking;Standing;House hold activities   Patient Stated Goals to return to work, walking, exercising   Currently in Pain? No/denies          TREATMENT   Therapeutic Exercise  L LAQ 3 x 10 with manual resistance; Standing squats 3 x 10 with cues for form  and manual correction to keep weight evenly distributed between right and left correction provided for form; Standing L knee terminal extension with green tband resistance 3 x 10  Standing L hip abduction and extension with red tband around ankles2 x 10; Bilateral heel raises with encouragement to increase WB through LLE with increased repetitions; Resisted side stepping with red tband around ankles 30' x 2; Step-ups to 6" step forward and then L lateral 2 x 10 each; Hooklying L single leg bridges 2 x 10; Prone L quad stretch 30s x 3; Cold pack applied to L knee x 5 minutes (unbilled); HEP progression  provided with education and red theraband for progression;                           PT Education - 12/03/16 0906    Education provided Yes   Education Details HEP progressed and consolidated   Person(s) Educated Patient   Methods Explanation   Comprehension Verbalized understanding             PT Long Term Goals - 11/12/16 1304      PT LONG TERM GOAL #1   Title Pt will improve LEFS score by at least 9 points to demonstrate improved functional ability with LLE   Baseline 16/80   Time 6   Period Weeks   Status New     PT LONG TERM GOAL #2   Title Pt will demonstrate 5/5 L hip F, Abd, and Add strength for improved stability and functional use of LLE   Baseline Hip F: 4-/5, hip Abd: 4+/5, hip Add: 4-/5   Time 5   Period Weeks   Status New     PT LONG TERM GOAL #3   Title Pt will improve 79mWT to at least 1.0 m/s with use of AD to demonstrate gait speed   Baseline 0.35 m/s with RW   Time 8   Period Weeks   Status New     PT LONG TERM GOAL #4   Title Pt will improve 5xSTS to at least 12.4 sec to demonstrate improvement in balance and BLE strength   Baseline 27.4 sec     PT LONG TERM GOAL #5   Title L knee flexion and extension AROM will improve to WNL for improved mechanics with functional activities   Baseline Flexion: 82 deg, Extension: -9 deg   Time 6   Period Weeks   Status New               Plan - 12/03/16 0906    Clinical Impression Statement Pt demonstrates excellent progress with therapy on this date. She is able to complete all exercises with only minimal increase in L knee soreness but no sharp pain. Provided HEP progression which consolidated prior exercises. Pt encouraged to follow-up as scheduled.    Rehab Potential Good   Clinical Impairments Affecting Rehab Potential (+) Pt with a very active lifstyle and motivation to return to PLOF (-) MD unable to repair 100% of damaged meniscal tissue   PT Frequency 2x / week   PT  Duration 8 weeks   PT Treatment/Interventions ADLs/Self Care Home Management;Aquatic Therapy;Biofeedback;Cryotherapy;Electrical Stimulation;Iontophoresis 4mg /ml Dexamethasone;Moist Heat;Ultrasound;Contrast Bath;DME Instruction;Gait training;Stair training;Functional mobility training;Therapeutic activities;Therapeutic exercise;Balance training;Neuromuscular re-education;Patient/family education;Orthotic Fit/Training;Manual techniques;Compression bandaging;Scar mobilization;Passive range of motion;Dry needling;Energy conservation;Taping   PT Next Visit Plan Manual therapy, stretching, strengthening, ionto if it continues to help.   PT Home Exercise Plan red hip extension, abduction  ytband resisted L TKE in standing, L single leg bridge, standing squats, prone quad stretch; (Regression: quad sets, glut sets, sidelying hip abduction)   Consulted and Agree with Plan of Care Patient      Patient will benefit from skilled therapeutic intervention in order to improve the following deficits and impairments:  Abnormal gait, Decreased activity tolerance, Decreased balance, Decreased endurance, Decreased knowledge of precautions, Decreased knowledge of use of DME, Decreased mobility, Decreased range of motion, Decreased safety awareness, Decreased scar mobility, Decreased strength, Difficulty walking, Hypomobility, Increased edema, Increased fascial restricitons, Increased muscle spasms, Impaired perceived functional ability, Impaired flexibility, Impaired sensation, Improper body mechanics, Postural dysfunction, Pain  Visit Diagnosis: Left knee pain, unspecified chronicity  Other abnormalities of gait and mobility     Problem List Patient Active Problem List   Diagnosis Date Noted  . Pre-op examination 10/25/2016  . Bilateral knee pain 11/01/2015  . History of thrombocytopenia 11/01/2015  . AP (abdominal pain)   . Diverticulitis large intestine 10/01/2015   Phillips Grout PT, DPT    Iden Stripling 12/03/2016, 10:11 AM  [place] [facility] [address] [place], [address] Phone: [phone]   Fax:  [phone]  Name: Rebecca Watkins MRN: [id number] Date of Birth: July 06, 1956

## 2016-12-05 ENCOUNTER — Ambulatory Visit: Payer: 59

## 2016-12-05 DIAGNOSIS — M25562 Pain in left knee: Secondary | ICD-10-CM

## 2016-12-05 DIAGNOSIS — R2689 Other abnormalities of gait and mobility: Secondary | ICD-10-CM

## 2016-12-05 NOTE — Therapy (Signed)
[place] [facility] [address] [place], [address] Phone: [phone]   Fax:  [phone]  Physical Therapy Treatment  Patient Details  Name: Rebecca Watkins MRN: [id number] Date of Birth: April 09, 1956 Referring Provider: Thornton Park, MD  Encounter Date: 12/05/2016      PT End of Session - 12/05/16 1315    Visit Number 8   Number of Visits 17   Date for PT Re-Evaluation 01/07/17   PT Start Time 1300   PT Stop Time 1345   PT Time Calculation (min) 45 min   Equipment Utilized During Treatment Other (comment)  L knee brace off entire session   Activity Tolerance Patient tolerated treatment well   Behavior During Therapy [facility] for tasks assessed/performed      Past Medical History:  Diagnosis Date  . Diverticulitis   . PONV (postoperative nausea and vomiting)   . Temporary low platelet count (HCC)    H/O    Past Surgical History:  Procedure Laterality Date  . COLONOSCOPY WITH PROPOFOL N/A 12/15/2015   Procedure: COLONOSCOPY WITH PROPOFOL;  Surgeon: Manya Silvas, MD;  Location: [facility] ENDOSCOPY;  Service: Endoscopy;  Laterality: N/A;  . KNEE ARTHROSCOPY WITH MEDIAL MENISECTOMY Left 10/31/2016   Procedure: KNEE ARTHROSCOPY WITH MEDIAL MENISECTOMY;  Surgeon: Thornton Park, MD;  Location: [facility] ORS;  Service: Orthopedics;  Laterality: Left;  . TUBAL LIGATION      There were no vitals filed for this visit.      Subjective Assessment - 12/05/16 1311    Subjective Pt reports soreness after the last therapy session but symptoms resolved the following day. HEP is going well without issue. No specific questions or concerns at this time. Denies pain currently   Pertinent History Pt is s/p tear of L medial meniscus s/p L knee arthroscopy with partial medial menisectomy, extensive synovecomy, removal of medial and suprapatella plica, chondroplasty of medial femoral condyle and undersurface of patella on 10/31/16. Pt  reports that she is WBAT per her MD.  No protocol provided from MD.  Pt is using polar ice each day for a few hours off and on with pain relief.  Pt reports she can fall asleep at night after finding a comfortable position but is awoken up to 20x/night due to pain and pins and needles in LLE and pt has a hard time finding a comfortable position after this.  Pins and needles associated with numbness down to her L foot which is intermittent. Patient's goals include: return to exercising again, walking again, and back to work.  Pt reports occasional L knee buckle and MD instructed pt to wear knee brace until this improved which pt presents with on Eval today. Pt is using RW at all times saying, "I don't feel confident not using it all the time". Still has stabbing pains on the medial side of her L knee which is intermittent, previously this was constant.  Pt denies any previous hip, knee, or ankle injuries to either LE.   Limitations Walking;Standing;House hold activities   Patient Stated Goals to return to work, walking, exercising   Currently in Pain? No/denies            TREATMENT   Therapeutic Exercise  NuStep L3, LEs only x 5 minutes during history; Standing squats 2 x 10 with verbal and tactile cues for form and manual correction to keep weight evenly distributed between right and left LE; Standing L knee terminal extension with greentband resistance 2  x 10  Standing hip abduction and extension with red tband around ankles2 x 10 bilateral; Total Gym L single leg squat level 14 2 x 10; Total Gym L single heel raises level 14 2 x 10; Resisted side stepping with red tband around ankles 30' x 2;  Manual  Prone L quad stretch 30s x 3; L patellar mobilizations medial, lateral, superior, and inferior, grade II-III, 30s/bout x 2 bouts each; L knee AP and PA mobilizations, grade II, 30s/bout x 2 bouts each in hooklying position; Cold pack applied to L knee x 5 minutes  (unbilled);                  PT Education - 12/05/16 1315    Education provided Yes   Education Details HEP reinforced   Person(s) Educated Patient   Methods Explanation   Comprehension Verbalized understanding             PT Long Term Goals - 11/12/16 1304      PT LONG TERM GOAL #1   Title Pt will improve LEFS score by at least 9 points to demonstrate improved functional ability with LLE   Baseline 16/80   Time 6   Period Weeks   Status New     PT LONG TERM GOAL #2   Title Pt will demonstrate 5/5 L hip F, Abd, and Add strength for improved stability and functional use of LLE   Baseline Hip F: 4-/5, hip Abd: 4+/5, hip Add: 4-/5   Time 5   Period Weeks   Status New     PT LONG TERM GOAL #3   Title Pt will improve 65mWT to at least 1.0 m/s with use of AD to demonstrate gait speed   Baseline 0.35 m/s with RW   Time 8   Period Weeks   Status New     PT LONG TERM GOAL #4   Title Pt will improve 5xSTS to at least 12.4 sec to demonstrate improvement in balance and BLE strength   Baseline 27.4 sec     PT LONG TERM GOAL #5   Title L knee flexion and extension AROM will improve to WNL for improved mechanics with functional activities   Baseline Flexion: 82 deg, Extension: -9 deg   Time 6   Period Weeks   Status New               Plan - 12/05/16 1315    Clinical Impression Statement Pt continues making excellent progress with therapy. She is able to perform all exercises without pain today. Able to progress to single leg squats on [employer]. Pt encouraged to continue HEP and follow-up as scheduled.    Rehab Potential Good   Clinical Impairments Affecting Rehab Potential (+) Pt with a very active lifstyle and motivation to return to PLOF (-) MD unable to repair 100% of damaged meniscal tissue   PT Frequency 2x / week   PT Duration 8 weeks   PT Treatment/Interventions ADLs/Self Care Home Management;Aquatic Therapy;Biofeedback;Cryotherapy;Electrical  Stimulation;Iontophoresis 4mg /ml Dexamethasone;Moist Heat;Ultrasound;Contrast Bath;DME Instruction;Gait training;Stair training;Functional mobility training;Therapeutic activities;Therapeutic exercise;Balance training;Neuromuscular re-education;Patient/family education;Orthotic Fit/Training;Manual techniques;Compression bandaging;Scar mobilization;Passive range of motion;Dry needling;Energy conservation;Taping   PT Next Visit Plan Manual therapy, stretching, strengthening, progress to single leg activities   PT Home Exercise Plan red hip extension, abduction   ytband resisted L TKE in standing, L single leg bridge, standing squats, prone quad stretch; (Regression: quad sets, glut sets, sidelying hip abduction)   Consulted and Agree with Plan  of Care Patient      Patient will benefit from skilled therapeutic intervention in order to improve the following deficits and impairments:  Abnormal gait, Decreased activity tolerance, Decreased balance, Decreased endurance, Decreased knowledge of precautions, Decreased knowledge of use of DME, Decreased mobility, Decreased range of motion, Decreased safety awareness, Decreased scar mobility, Decreased strength, Difficulty walking, Hypomobility, Increased edema, Increased fascial restricitons, Increased muscle spasms, Impaired perceived functional ability, Impaired flexibility, Impaired sensation, Improper body mechanics, Postural dysfunction, Pain  Visit Diagnosis: Left knee pain, unspecified chronicity  Other abnormalities of gait and mobility     Problem List Patient Active Problem List   Diagnosis Date Noted  . Pre-op examination 10/25/2016  . Bilateral knee pain 11/01/2015  . History of thrombocytopenia 11/01/2015  . AP (abdominal pain)   . Diverticulitis large intestine 10/01/2015   Phillips Grout PT, DPT   Rebecca Watkins 12/05/2016, 1:29 PM  [place] [facility] [address], [place], [address] Phone: [phone]   Fax:  [phone]  Name: Rebecca Watkins MRN: [id number] Date of Birth: [birth year]

## 2016-12-10 ENCOUNTER — Ambulatory Visit: Payer: 59

## 2016-12-10 DIAGNOSIS — M25562 Pain in left knee: Secondary | ICD-10-CM

## 2016-12-10 DIAGNOSIS — R2689 Other abnormalities of gait and mobility: Secondary | ICD-10-CM | POA: Diagnosis not present

## 2016-12-10 NOTE — Therapy (Signed)
[place] [facility] [address] [place], [address] Phone: [phone]   Fax:  [phone]  Physical Therapy Treatment  Patient Details  Name: Rebecca Watkins MRN: [id number] Date of Birth: 1955/12/06 Referring Provider: Thornton Park, MD  Encounter Date: 12/10/2016      PT End of Session - 12/10/16 1209    Visit Number 9   Number of Visits 17   Date for PT Re-Evaluation 01/07/17   PT Start Time 1118   PT Stop Time 1200   PT Time Calculation (min) 42 min   Equipment Utilized During Treatment Other (comment)  L knee brace off entire session   Activity Tolerance Patient tolerated treatment well   Behavior During Therapy [facility] for tasks assessed/performed      Past Medical History:  Diagnosis Date  . Diverticulitis   . PONV (postoperative nausea and vomiting)   . Temporary low platelet count (HCC)    H/O    Past Surgical History:  Procedure Laterality Date  . COLONOSCOPY WITH PROPOFOL N/A 12/15/2015   Procedure: COLONOSCOPY WITH PROPOFOL;  Surgeon: Manya Silvas, MD;  Location: [facility] ENDOSCOPY;  Service: Endoscopy;  Laterality: N/A;  . KNEE ARTHROSCOPY WITH MEDIAL MENISECTOMY Left 10/31/2016   Procedure: KNEE ARTHROSCOPY WITH MEDIAL MENISECTOMY;  Surgeon: Thornton Park, MD;  Location: [facility] ORS;  Service: Orthopedics;  Laterality: Left;  . TUBAL LIGATION      There were no vitals filed for this visit.      Subjective Assessment - 12/10/16 1127    Subjective Patient reports increased medial knee pain starting 2 days ago when waking up in the morning. Patient reports pain is worse with weight bearing.    Pertinent History Pt is s/p tear of L medial meniscus s/p L knee arthroscopy with partial medial menisectomy, extensive synovecomy, removal of medial and suprapatella plica, chondroplasty of medial femoral condyle and undersurface of patella on 10/31/16. Pt reports that she is WBAT per her MD.  No  protocol provided from MD.  Pt is using polar ice each day for a few hours off and on with pain relief.  Pt reports she can fall asleep at night after finding a comfortable position but is awoken up to 20x/night due to pain and pins and needles in LLE and pt has a hard time finding a comfortable position after this.  Pins and needles associated with numbness down to her L foot which is intermittent. Patient's goals include: return to exercising again, walking again, and back to work.  Pt reports occasional L knee buckle and MD instructed pt to wear knee brace until this improved which pt presents with on Eval today. Pt is using RW at all times saying, "I don't feel confident not using it all the time". Still has stabbing pains on the medial side of her L knee which is intermittent, previously this was constant.  Pt denies any previous hip, knee, or ankle injuries to either LE.   Limitations Walking;Standing;House hold activities   Patient Stated Goals to return to work, walking, exercising   Currently in Pain? No/denies      TREATMENT    Therapeutic Exercise   NuStep L4, LEs only x 6 minutes during history; Standing L knee terminal extension with Black tubing x20  Standing hip abduction --  x20 bilateral; Standing Retrostep B LE's with cueing to keep L LE straight throughout movement Single leg; leg press at [place] - 2 x 20 with 5# weight  Manual  L patellar mobilizations medial, lateral, superior, and inferior, grade II-III, 30s/bout x 2 bouts each; L knee AP and PA mobilizations, grade III, 30s/bout x 2 bouts each in hooklying position; Distraction in sitting to the knee to decrease pain - 2 x 30sec         PT Education - 12/10/16 1209    Education provided Yes   Education Details form/technique with exercise   Person(s) Educated Patient   Methods Explanation;Demonstration   Comprehension Verbalized understanding;Returned demonstration             PT Long Term Goals - 11/12/16  1304      PT LONG TERM GOAL #1   Title Pt will improve LEFS score by at least 9 points to demonstrate improved functional ability with LLE   Baseline 16/80   Time 6   Period Weeks   Status New     PT LONG TERM GOAL #2   Title Pt will demonstrate 5/5 L hip F, Abd, and Add strength for improved stability and functional use of LLE   Baseline Hip F: 4-/5, hip Abd: 4+/5, hip Add: 4-/5   Time 5   Period Weeks   Status New     PT LONG TERM GOAL #3   Title Pt will improve 67mWT to at least 1.0 m/s with use of AD to demonstrate gait speed   Baseline 0.35 m/s with RW   Time 8   Period Weeks   Status New     PT LONG TERM GOAL #4   Title Pt will improve 5xSTS to at least 12.4 sec to demonstrate improvement in balance and BLE strength   Baseline 27.4 sec     PT LONG TERM GOAL #5   Title L knee flexion and extension AROM will improve to WNL for improved mechanics with functional activities   Baseline Flexion: 82 deg, Extension: -9 deg   Time 6   Period Weeks   Status New               Plan - 12/10/16 1210    Clinical Impression Statement Patient demonstrates increased knee pain with exercises today requiring cueing to perform throughout decreased AROM to decrease pain. Patient demonstrates decreased quadriceps activation on the L LE which is improved after tactile cueing and performing SLRs in supine. Patient will benefit from further skilled therapy focused on improving quadriceps activation to return to prior level of function.    Rehab Potential Good   Clinical Impairments Affecting Rehab Potential (+) Pt with a very active lifstyle and motivation to return to PLOF (-) MD unable to repair 100% of damaged meniscal tissue   PT Frequency 2x / week   PT Duration 8 weeks   PT Treatment/Interventions ADLs/Self Care Home Management;Aquatic Therapy;Biofeedback;Cryotherapy;Electrical Stimulation;Iontophoresis 4mg /ml Dexamethasone;Moist Heat;Ultrasound;Contrast Bath;DME Instruction;Gait  training;Stair training;Functional mobility training;Therapeutic activities;Therapeutic exercise;Balance training;Neuromuscular re-education;Patient/family education;Orthotic Fit/Training;Manual techniques;Compression bandaging;Scar mobilization;Passive range of motion;Dry needling;Energy conservation;Taping   PT Next Visit Plan Manual therapy, stretching, strengthening, progress to single leg activities   PT Home Exercise Plan red hip extension, abduction   ytband resisted L TKE in standing, L single leg bridge, standing squats, prone quad stretch; (Regression: quad sets, glut sets, sidelying hip abduction)   Consulted and Agree with Plan of Care Patient      Patient will benefit from skilled therapeutic intervention in order to improve the following deficits and impairments:  Abnormal gait, Decreased activity tolerance, Decreased balance, Decreased endurance, Decreased knowledge of precautions, Decreased knowledge  of use of DME, Decreased mobility, Decreased range of motion, Decreased safety awareness, Decreased scar mobility, Decreased strength, Difficulty walking, Hypomobility, Increased edema, Increased fascial restricitons, Increased muscle spasms, Impaired perceived functional ability, Impaired flexibility, Impaired sensation, Improper body mechanics, Postural dysfunction, Pain  Visit Diagnosis: Left knee pain, unspecified chronicity  Other abnormalities of gait and mobility     Problem List Patient Active Problem List   Diagnosis Date Noted  . Pre-op examination 10/25/2016  . Bilateral knee pain 11/01/2015  . History of thrombocytopenia 11/01/2015  . AP (abdominal pain)   . Diverticulitis large intestine 10/01/2015    Blythe Stanford, PT DPT 12/10/2016, 1:41 PM  [place] [facility] [address], [place], [address] Phone: [phone]   Fax:  [phone]  Name: Rebecca Watkins MRN: [id number] Date of Birth:  05/11/1956

## 2016-12-12 ENCOUNTER — Ambulatory Visit: Payer: 59

## 2016-12-12 DIAGNOSIS — M25562 Pain in left knee: Secondary | ICD-10-CM

## 2016-12-12 DIAGNOSIS — R2689 Other abnormalities of gait and mobility: Secondary | ICD-10-CM | POA: Diagnosis not present

## 2016-12-12 NOTE — Therapy (Signed)
[facility] [address] [place], [address] Phone: [phone]   Fax:  [phone]  Physical Therapy Treatment  Patient Details  Name: Rebecca Watkins MRN: [id number] Date of Birth: [birth year] Referring Provider: Thornton Park, MD  Encounter Date: 12/12/2016    Past Medical History:  Diagnosis Date  . Diverticulitis   . PONV (postoperative nausea and vomiting)   . Temporary low platelet count (HCC)    H/O    Past Surgical History:  Procedure Laterality Date  . COLONOSCOPY WITH PROPOFOL N/A 12/15/2015   Procedure: COLONOSCOPY WITH PROPOFOL;  Surgeon: Manya Silvas, MD;  Location: [facility] ENDOSCOPY;  Service: Endoscopy;  Laterality: N/A;  . KNEE ARTHROSCOPY WITH MEDIAL MENISECTOMY Left 10/31/2016   Procedure: KNEE ARTHROSCOPY WITH MEDIAL MENISECTOMY;  Surgeon: Thornton Park, MD;  Location: [facility] ORS;  Service: Orthopedics;  Laterality: Left;  . TUBAL LIGATION      There were no vitals filed for this visit.      Subjective Assessment - 12/12/16 1120    Subjective Patient reports increased medial knee pain and stiffness over the past two day but is feeling better today.   Pertinent History Pt is s/p tear of L medial meniscus s/p L knee arthroscopy with partial medial menisectomy, extensive synovecomy, removal of medial and suprapatella plica, chondroplasty of medial femoral condyle and undersurface of patella on 10/31/16. Pt reports that she is WBAT per her MD.  No protocol provided from MD.  Pt is using polar ice each day for a few hours off and on with pain relief.  Pt reports she can fall asleep at night after finding a comfortable position but is awoken up to 20x/night due to pain and pins and needles in LLE and pt has a hard time finding a comfortable position after this.  Pins and needles associated with numbness down to her L foot which is intermittent. Patient's goals include: return to exercising  again, walking again, and back to work.  Pt reports occasional L knee buckle and MD instructed pt to wear knee brace until this improved which pt presents with on Eval today. Pt is using RW at all times saying, "I don't feel confident not using it all the time". Still has stabbing pains on the medial side of her L knee which is intermittent, previously this was constant.  Pt denies any previous hip, knee, or ankle injuries to either LE.   Limitations Walking;Standing;House hold activities   Patient Stated Goals to return to work, walking, exercising   Currently in Pain? No/denies       TREATMENT    Therapeutic Exercise   Recumbent bike isokinetic at 90,  x 6 minutes during history; with cues on improving speed Bilateral leg; leg press at [place] - 2 x 20 with 20# weight Standing L knee terminal extension with Black tubing -- x20  Standing marches on Bosu ball with focus on activating quadriceps  -- x 20   Heel taps from 4" step - 2 x 10 B with UE support Hip machine -- hip abd against 55# of resistance 2 x 10 B     Manual  L patellar mobilizations medial, lateral, superior, and inferior, grade II-III, 30s/bout x 2 bouts each; Scar tissue massage with patient in long sitting to decrease adhesions 3 x 60secs          PT Education - 12/12/16 1122    Education provided Yes   Education Details form/technique with exercise  Person(s) Educated Patient   Methods Explanation;Demonstration   Comprehension Verbalized understanding;Returned demonstration             PT Long Term Goals - 11/12/16 1304      PT LONG TERM GOAL #1   Title Pt will improve LEFS score by at least 9 points to demonstrate improved functional ability with LLE   Baseline 16/80   Time 6   Period Weeks   Status New     PT LONG TERM GOAL #2   Title Pt will demonstrate 5/5 L hip F, Abd, and Add strength for improved stability and functional use of LLE   Baseline Hip F: 4-/5, hip Abd: 4+/5, hip Add: 4-/5    Time 5   Period Weeks   Status New     PT LONG TERM GOAL #3   Title Pt will improve 40mWT to at least 1.0 m/s with use of AD to demonstrate gait speed   Baseline 0.35 m/s with RW   Time 8   Period Weeks   Status New     PT LONG TERM GOAL #4   Title Pt will improve 5xSTS to at least 12.4 sec to demonstrate improvement in balance and BLE strength   Baseline 27.4 sec     PT LONG TERM GOAL #5   Title L knee flexion and extension AROM will improve to WNL for improved mechanics with functional activities   Baseline Flexion: 82 deg, Extension: -9 deg   Time 6   Period Weeks   Status New               Plan - 12/12/16 1203    Clinical Impression Statement Patient demonstrates improvement in quad activation with exercises today indicating functional carryover between visits. Continued to perform quadriceps exercises to improve strength and stabilization and patient will benefit from further skilled therapy focused on improving quad activation and strength to return to prior level of function.    Rehab Potential Good   Clinical Impairments Affecting Rehab Potential (+) Pt with a very active lifstyle and motivation to return to PLOF (-) MD unable to repair 100% of damaged meniscal tissue   PT Frequency 2x / week   PT Duration 8 weeks   PT Treatment/Interventions ADLs/Self Care Home Management;Aquatic Therapy;Biofeedback;Cryotherapy;Electrical Stimulation;Iontophoresis 4mg /ml Dexamethasone;Moist Heat;Ultrasound;Contrast Bath;DME Instruction;Gait training;Stair training;Functional mobility training;Therapeutic activities;Therapeutic exercise;Balance training;Neuromuscular re-education;Patient/family education;Orthotic Fit/Training;Manual techniques;Compression bandaging;Scar mobilization;Passive range of motion;Dry needling;Energy conservation;Taping   PT Next Visit Plan Manual therapy, stretching, strengthening, progress to single leg activities   PT Home Exercise Plan red hip extension,  abduction   ytband resisted L TKE in standing, L single leg bridge, standing squats, prone quad stretch; (Regression: quad sets, glut sets, sidelying hip abduction)   Consulted and Agree with Plan of Care Patient      Patient will benefit from skilled therapeutic intervention in order to improve the following deficits and impairments:  Abnormal gait, Decreased activity tolerance, Decreased balance, Decreased endurance, Decreased knowledge of precautions, Decreased knowledge of use of DME, Decreased mobility, Decreased range of motion, Decreased safety awareness, Decreased scar mobility, Decreased strength, Difficulty walking, Hypomobility, Increased edema, Increased fascial restricitons, Increased muscle spasms, Impaired perceived functional ability, Impaired flexibility, Impaired sensation, Improper body mechanics, Postural dysfunction, Pain  Visit Diagnosis: Left knee pain, unspecified chronicity  Other abnormalities of gait and mobility     Problem List Patient Active Problem List   Diagnosis Date Noted  . Pre-op examination 10/25/2016  . Bilateral knee pain 11/01/2015  .  History of thrombocytopenia 11/01/2015  . AP (abdominal pain)   . Diverticulitis large intestine 10/01/2015    Blythe Stanford, PT DPT 12/12/2016, 12:07 PM  [facility] [address] [place], [address] Phone: [phone]   Fax:  [phone]  Name: Rebecca Watkins MRN: [id number] Date of Birth: 19-Jun-1956

## 2016-12-16 ENCOUNTER — Ambulatory Visit: Payer: 59

## 2016-12-19 ENCOUNTER — Ambulatory Visit: Payer: 59 | Attending: Orthopedic Surgery

## 2016-12-19 DIAGNOSIS — R2689 Other abnormalities of gait and mobility: Secondary | ICD-10-CM | POA: Insufficient documentation

## 2016-12-19 DIAGNOSIS — M25562 Pain in left knee: Secondary | ICD-10-CM | POA: Insufficient documentation

## 2016-12-23 DIAGNOSIS — M25562 Pain in left knee: Secondary | ICD-10-CM | POA: Diagnosis not present

## 2016-12-23 DIAGNOSIS — M1712 Unilateral primary osteoarthritis, left knee: Secondary | ICD-10-CM | POA: Diagnosis not present

## 2016-12-24 ENCOUNTER — Ambulatory Visit
Admission: RE | Admit: 2016-12-24 | Discharge: 2016-12-24 | Disposition: A | Discharge status: Home / Self Care | Payer: 59 | Source: Ambulatory Visit | Attending: Obstetrics and Gynecology | Admitting: Obstetrics and Gynecology

## 2016-12-24 ENCOUNTER — Ambulatory Visit: Payer: 59

## 2016-12-24 DIAGNOSIS — N6002 Solitary cyst of left breast: Secondary | ICD-10-CM | POA: Diagnosis not present

## 2016-12-24 DIAGNOSIS — R2689 Other abnormalities of gait and mobility: Secondary | ICD-10-CM | POA: Diagnosis not present

## 2016-12-24 DIAGNOSIS — M25562 Pain in left knee: Secondary | ICD-10-CM

## 2016-12-24 NOTE — Therapy (Signed)
[facility] [address] [place], [address] Phone: [phone]   Fax:  [phone]  Physical Therapy Treatment  Patient Details  Name: Rebecca Watkins MRN: [id number] Date of Birth: Jan 10, 1956 Referring Provider: Thornton Park, MD  Encounter Date: 12/24/2016      PT End of Session - 12/24/16 1609    Visit Number 10   Number of Visits 17   Date for PT Re-Evaluation 01/07/17   PT Start Time 1430   PT Stop Time 1515   PT Time Calculation (min) 45 min   Equipment Utilized During Treatment Other (comment)  L knee brace off entire session   Activity Tolerance Patient tolerated treatment well   Behavior During Therapy [facility] for tasks assessed/performed      Past Medical History:  Diagnosis Date  . Diverticulitis   . PONV (postoperative nausea and vomiting)   . Temporary low platelet count (HCC)    H/O    Past Surgical History:  Procedure Laterality Date  . COLONOSCOPY WITH PROPOFOL N/A 12/15/2015   Procedure: COLONOSCOPY WITH PROPOFOL;  Surgeon: Manya Silvas, MD;  Location: [facility] ENDOSCOPY;  Service: Endoscopy;  Laterality: N/A;  . KNEE ARTHROSCOPY WITH MEDIAL MENISECTOMY Left 10/31/2016   Procedure: KNEE ARTHROSCOPY WITH MEDIAL MENISECTOMY;  Surgeon: Thornton Park, MD;  Location: [facility] ORS;  Service: Orthopedics;  Laterality: Left;  . TUBAL LIGATION      There were no vitals filed for this visit.      Subjective Assessment - 12/24/16 1436    Subjective Patient reports increased medial knee pain and stiffness over the past two day but is feeling better today.   Pertinent History Pt is s/p tear of L medial meniscus s/p L knee arthroscopy with partial medial menisectomy, extensive synovecomy, removal of medial and suprapatella plica, chondroplasty of medial femoral condyle and undersurface of patella on 10/31/16. Pt reports that she is WBAT per her MD.  No protocol provided from MD.  Pt is using polar  ice each day for a few hours off and on with pain relief.  Pt reports she can fall asleep at night after finding a comfortable position but is awoken up to 20x/night due to pain and pins and needles in LLE and pt has a hard time finding a comfortable position after this.  Pins and needles associated with numbness down to her L foot which is intermittent. Patient's goals include: return to exercising again, walking again, and back to work.  Pt reports occasional L knee buckle and MD instructed pt to wear knee brace until this improved which pt presents with on Eval today. Pt is using RW at all times saying, "I don't feel confident not using it all the time". Still has stabbing pains on the medial side of her L knee which is intermittent, previously this was constant.  Pt denies any previous hip, knee, or ankle injuries to either LE.   Limitations Walking;Standing;House hold activities   Patient Stated Goals to return to work, walking, exercising      TREATMENT    Therapeutic Exercise   Nustep with resistance level 3 for 7 min with cueing on speed and performance Seated hamstring curls in sitting at EOB with YTB Single leg dead lifts throughout shortened range to not aggravate pain Unilateral leg; leg press at [place] - 2 x 20 with 15# weight on L LE only Deadlifts from weight placed on chair - x 15  SLR in long sitting -  x10  Semi tandem stance weight shifts R/L - x 15     Manual  L patellar mobilizations medial, lateral, superior, and inferior, grade II-III, 30s/bout x 2 bouts each; Scar tissue massage with patient in long sitting to decrease adhesions 3 x 60secs; STM to medial knee        PT Education - 12/24/16 1609    Education provided Yes   Education Details form/technique with exercise   Person(s) Educated Patient   Methods Explanation;Demonstration   Comprehension Verbalized understanding;Returned demonstration             PT Long Term Goals - 11/12/16 1304      PT LONG  TERM GOAL #1   Title Pt will improve LEFS score by at least 9 points to demonstrate improved functional ability with LLE   Baseline 16/80   Time 6   Period Weeks   Status New     PT LONG TERM GOAL #2   Title Pt will demonstrate 5/5 L hip F, Abd, and Add strength for improved stability and functional use of LLE   Baseline Hip F: 4-/5, hip Abd: 4+/5, hip Add: 4-/5   Time 5   Period Weeks   Status New     PT LONG TERM GOAL #3   Title Pt will improve 78mWT to at least 1.0 m/s with use of AD to demonstrate gait speed   Baseline 0.35 m/s with RW   Time 8   Period Weeks   Status New     PT LONG TERM GOAL #4   Title Pt will improve 5xSTS to at least 12.4 sec to demonstrate improvement in balance and BLE strength   Baseline 27.4 sec     PT LONG TERM GOAL #5   Title L knee flexion and extension AROM will improve to WNL for improved mechanics with functional activities   Baseline Flexion: 82 deg, Extension: -9 deg   Time 6   Period Weeks   Status New               Plan - 12/24/16 1610    Clinical Impression Statement Patient demonstrates increased pain with active knee flexion and found increased pain/spasms along the medial hamstring tendon on the affected side. Patient demonstrates decreased pain with knee flexion after performing STM. Patient continues to demonstrate decreased quad activation on the L side and focused on improving quad activation with exercises; patient will benefit from further skilled therapy focused on improving quad/hamstring activation/strength on the affected side to return to prior level  of function.    Rehab Potential Good   Clinical Impairments Affecting Rehab Potential (+) Pt with a very active lifstyle and motivation to return to PLOF (-) MD unable to repair 100% of damaged meniscal tissue   PT Frequency 2x / week   PT Duration 8 weeks   PT Treatment/Interventions ADLs/Self Care Home Management;Aquatic Therapy;Biofeedback;Cryotherapy;Electrical  Stimulation;Iontophoresis 4mg /ml Dexamethasone;Moist Heat;Ultrasound;Contrast Bath;DME Instruction;Gait training;Stair training;Functional mobility training;Therapeutic activities;Therapeutic exercise;Balance training;Neuromuscular re-education;Patient/family education;Orthotic Fit/Training;Manual techniques;Compression bandaging;Scar mobilization;Passive range of motion;Dry needling;Energy conservation;Taping   PT Next Visit Plan Manual therapy, stretching, strengthening, progress to single leg activities   PT Home Exercise Plan red hip extension, abduction   ytband resisted L TKE in standing, L single leg bridge, standing squats, prone quad stretch; (Regression: quad sets, glut sets, sidelying hip abduction)   Consulted and Agree with Plan of Care Patient      Patient will benefit from skilled therapeutic intervention in order to improve the  following deficits and impairments:  Abnormal gait, Decreased activity tolerance, Decreased balance, Decreased endurance, Decreased knowledge of precautions, Decreased knowledge of use of DME, Decreased mobility, Decreased range of motion, Decreased safety awareness, Decreased scar mobility, Decreased strength, Difficulty walking, Hypomobility, Increased edema, Increased fascial restricitons, Increased muscle spasms, Impaired perceived functional ability, Impaired flexibility, Impaired sensation, Improper body mechanics, Postural dysfunction, Pain  Visit Diagnosis: Left knee pain, unspecified chronicity     Problem List Patient Active Problem List   Diagnosis Date Noted  . Pre-op examination 10/25/2016  . Bilateral knee pain 11/01/2015  . History of thrombocytopenia 11/01/2015  . AP (abdominal pain)   . Diverticulitis large intestine 10/01/2015    Blythe Stanford, PT DPT 12/24/2016, 4:14 PM  [facility] [address] [place], [address] Phone: [phone]   Fax:   [phone]  Name: Rebecca Watkins MRN: [id number] Date of Birth: [birth year]

## 2016-12-26 ENCOUNTER — Ambulatory Visit: Payer: Worker's Compensation

## 2016-12-30 ENCOUNTER — Ambulatory Visit: Payer: 59

## 2016-12-31 ENCOUNTER — Ambulatory Visit: Payer: 59

## 2016-12-31 DIAGNOSIS — M25562 Pain in left knee: Secondary | ICD-10-CM | POA: Diagnosis not present

## 2016-12-31 DIAGNOSIS — R2689 Other abnormalities of gait and mobility: Secondary | ICD-10-CM | POA: Diagnosis not present

## 2016-12-31 NOTE — Therapy (Signed)
[facility] [address], [place], [address] Phone: [phone]   Fax:  [phone]  Physical Therapy Treatment  Patient Details  Name: Rebecca Watkins MRN: [id number] Date of Birth: 1956/01/17 Referring Provider: Thornton Park, MD  Encounter Date: 12/31/2016      PT End of Session - 12/31/16 1136    Visit Number 11   Number of Visits 17   Date for PT Re-Evaluation 01/07/17   PT Start Time 1118   PT Stop Time 1150   PT Time Calculation (min) 32 min   Equipment Utilized During Treatment Other (comment)  L knee brace off entire session   Activity Tolerance Patient tolerated treatment well   Behavior During Therapy [facility] for tasks assessed/performed      Past Medical History:  Diagnosis Date  . Diverticulitis   . PONV (postoperative nausea and vomiting)   . Temporary low platelet count (HCC)    H/O    Past Surgical History:  Procedure Laterality Date  . COLONOSCOPY WITH PROPOFOL N/A 12/15/2015   Procedure: COLONOSCOPY WITH PROPOFOL;  Surgeon: Manya Silvas, MD;  Location: [facility] ENDOSCOPY;  Service: Endoscopy;  Laterality: N/A;  . KNEE ARTHROSCOPY WITH MEDIAL MENISECTOMY Left 10/31/2016   Procedure: KNEE ARTHROSCOPY WITH MEDIAL MENISECTOMY;  Surgeon: Thornton Park, MD;  Location: [facility] ORS;  Service: Orthopedics;  Laterality: Left;  . TUBAL LIGATION      There were no vitals filed for this visit.      Subjective Assessment - 12/31/16 1134    Subjective Patient reports her daughter was recently diagnosed with breast CA over the weekend and her knee was very painful yesterday. Patient reports she has increased pain with walking, going from sitting to standing, and performing the stairs.    Pertinent History Pt is s/p tear of L medial meniscus s/p L knee arthroscopy with partial medial menisectomy, extensive synovecomy, removal of medial and suprapatella plica, chondroplasty of medial femoral  condyle and undersurface of patella on 10/31/16. Pt reports that she is WBAT per her MD.  No protocol provided from MD.  Pt is using polar ice each day for a few hours off and on with pain relief.  Pt reports she can fall asleep at night after finding a comfortable position but is awoken up to 20x/night due to pain and pins and needles in LLE and pt has a hard time finding a comfortable position after this.  Pins and needles associated with numbness down to her L foot which is intermittent. Patient's goals include: return to exercising again, walking again, and back to work.  Pt reports occasional L knee buckle and MD instructed pt to wear knee brace until this improved which pt presents with on Eval today. Pt is using RW at all times saying, "I don't feel confident not using it all the time". Still has stabbing pains on the medial side of her L knee which is intermittent, previously this was constant.  Pt denies any previous hip, knee, or ankle injuries to either LE.   Limitations Walking;Standing;House hold activities   Patient Stated Goals to return to work, walking, exercising   Currently in Pain? No/denies        TREATMENT    Therapeutic Exercise   Nustep with resistance level 5 for 4 min with cueing on speed and performance Isometrics in sitting for quadriceps activation - x5 with 5 sec holds Manual distraction to decrease knee pain - 2 x 30sec with patient  in sitting Standing Closed kinetic knee ext in standing against black tubing - x 20  Patient demonstrates difficulty with exercise secondary to emotional distraught          PT Education - 12/31/16 1136    Education provided Yes   Education Details form/technique with exercise   Person(s) Educated Patient   Methods Explanation;Demonstration   Comprehension Verbalized understanding;Returned demonstration             PT Long Term Goals - 11/12/16 1304      PT LONG TERM GOAL #1   Title Pt will improve LEFS score by at  least 9 points to demonstrate improved functional ability with LLE   Baseline 16/80   Time 6   Period Weeks   Status New     PT LONG TERM GOAL #2   Title Pt will demonstrate 5/5 L hip F, Abd, and Add strength for improved stability and functional use of LLE   Baseline Hip F: 4-/5, hip Abd: 4+/5, hip Add: 4-/5   Time 5   Period Weeks   Status New     PT LONG TERM GOAL #3   Title Pt will improve 84mWT to at least 1.0 m/s with use of AD to demonstrate gait speed   Baseline 0.35 m/s with RW   Time 8   Period Weeks   Status New     PT LONG TERM GOAL #4   Title Pt will improve 5xSTS to at least 12.4 sec to demonstrate improvement in balance and BLE strength   Baseline 27.4 sec     PT LONG TERM GOAL #5   Title L knee flexion and extension AROM will improve to WNL for improved mechanics with functional activities   Baseline Flexion: 82 deg, Extension: -9 deg   Time 6   Period Weeks   Status New               Plan - 12/31/16 1222    Clinical Impression Statement Ended therapy early secondary due to emotional distraught from a recent family emergency. Patient demonstrates decreased quad activation with active knee extension indicating poor coordination and control. Patient will benefit from further skilled therapy focused on improved quadriceps activation to return to prior level of function.    Rehab Potential Good   Clinical Impairments Affecting Rehab Potential (+) Pt with a very active lifstyle and motivation to return to PLOF (-) MD unable to repair 100% of damaged meniscal tissue   PT Frequency 2x / week   PT Duration 8 weeks   PT Treatment/Interventions ADLs/Self Care Home Management;Aquatic Therapy;Biofeedback;Cryotherapy;Electrical Stimulation;Iontophoresis 4mg /ml Dexamethasone;Moist Heat;Ultrasound;Contrast Bath;DME Instruction;Gait training;Stair training;Functional mobility training;Therapeutic activities;Therapeutic exercise;Balance training;Neuromuscular  re-education;Patient/family education;Orthotic Fit/Training;Manual techniques;Compression bandaging;Scar mobilization;Passive range of motion;Dry needling;Energy conservation;Taping   PT Next Visit Plan Manual therapy, stretching, strengthening, progress to single leg activities   PT Home Exercise Plan red hip extension, abduction   ytband resisted L TKE in standing, L single leg bridge, standing squats, prone quad stretch; (Regression: quad sets, glut sets, sidelying hip abduction)   Consulted and Agree with Plan of Care Patient      Patient will benefit from skilled therapeutic intervention in order to improve the following deficits and impairments:  Abnormal gait, Decreased activity tolerance, Decreased balance, Decreased endurance, Decreased knowledge of precautions, Decreased knowledge of use of DME, Decreased mobility, Decreased range of motion, Decreased safety awareness, Decreased scar mobility, Decreased strength, Difficulty walking, Hypomobility, Increased edema, Increased fascial restricitons, Increased muscle spasms, Impaired perceived  functional ability, Impaired flexibility, Impaired sensation, Improper body mechanics, Postural dysfunction, Pain  Visit Diagnosis: Left knee pain, unspecified chronicity     Problem List Patient Active Problem List   Diagnosis Date Noted  . Pre-op examination 10/25/2016  . Bilateral knee pain 11/01/2015  . History of thrombocytopenia 11/01/2015  . AP (abdominal pain)   . Diverticulitis large intestine 10/01/2015    Blythe Stanford, PT DPT 12/31/2016, 12:27 PM  [facility] [address], [place], [address] Phone: [phone]   Fax:  [phone]  Name: Patches Mcdonnell MRN: [id number] Date of Birth: [birth year]

## 2017-01-02 ENCOUNTER — Ambulatory Visit: Payer: 59

## 2017-01-02 DIAGNOSIS — R2689 Other abnormalities of gait and mobility: Secondary | ICD-10-CM

## 2017-01-02 DIAGNOSIS — M25562 Pain in left knee: Secondary | ICD-10-CM | POA: Diagnosis not present

## 2017-01-02 NOTE — Therapy (Signed)
[facility] [address], [place], [address] Phone: [phone]   Fax:  [phone]  Physical Therapy Treatment  Patient Details  Name: Rebecca Watkins MRN: [id number] Date of Birth: 03-Mar-1956 Referring Provider: Thornton Park, MD  Encounter Date: 01/02/2017      PT End of Session - 01/02/17 1103    Visit Number 12   Number of Visits 17   Date for PT Re-Evaluation 01/07/17   PT Start Time 0950   PT Stop Time 1030   PT Time Calculation (min) 40 min   Equipment Utilized During Treatment Other (comment)  L knee brace off entire session   Activity Tolerance Patient tolerated treatment well   Behavior During Therapy [facility] for tasks assessed/performed      Past Medical History:  Diagnosis Date  . Diverticulitis   . PONV (postoperative nausea and vomiting)   . Temporary low platelet count (HCC)    H/O    Past Surgical History:  Procedure Laterality Date  . COLONOSCOPY WITH PROPOFOL N/A 12/15/2015   Procedure: COLONOSCOPY WITH PROPOFOL;  Surgeon: Manya Silvas, MD;  Location: [facility] ENDOSCOPY;  Service: Endoscopy;  Laterality: N/A;  . KNEE ARTHROSCOPY WITH MEDIAL MENISECTOMY Left 10/31/2016   Procedure: KNEE ARTHROSCOPY WITH MEDIAL MENISECTOMY;  Surgeon: Thornton Park, MD;  Location: [facility] ORS;  Service: Orthopedics;  Laterality: Left;  . TUBAL LIGATION      There were no vitals filed for this visit.      Subjective Assessment - 01/02/17 1101    Subjective Patient reports her knee pain continues to bother her when she ambulates. Patient reports she would like to work on improving the motor control of her leg.    Pertinent History Pt is s/p tear of L medial meniscus s/p L knee arthroscopy with partial medial menisectomy, extensive synovecomy, removal of medial and suprapatella plica, chondroplasty of medial femoral condyle and undersurface of patella on 10/31/16. Pt reports that she is WBAT per her MD.   No protocol provided from MD.  Pt is using polar ice each day for a few hours off and on with pain relief.  Pt reports she can fall asleep at night after finding a comfortable position but is awoken up to 20x/night due to pain and pins and needles in LLE and pt has a hard time finding a comfortable position after this.  Pins and needles associated with numbness down to her L foot which is intermittent. Patient's goals include: return to exercising again, walking again, and back to work.  Pt reports occasional L knee buckle and MD instructed pt to wear knee brace until this improved which pt presents with on Eval today. Pt is using RW at all times saying, "I don't feel confident not using it all the time". Still has stabbing pains on the medial side of her L knee which is intermittent, previously this was constant.  Pt denies any previous hip, knee, or ankle injuries to either LE.   Limitations Walking;Standing;House hold activities   Patient Stated Goals to return to work, walking, exercising   Currently in Pain? No/denies      TREATMENT:  Manual Therapy: STM to the distal attachment/muscle belly of the hamstrings to decrease pain and spasms with patient positioned in long sitting   Therapeutic Exercise: With patient in long sitting -- SLR with 10# weight over quadricep -- 3 maximal lift and holds (hold for 4 sec) every minute with use of modalities for 34min  Step ups with L LE with UE use -- x 15 (with cueing to decrease speed) Retrostep ant/post weight shifts and focus on improving quadriceps activation -- x15 B Heel walking without UE support -- x 28ft  -- stopped secondary to pain Heel walking with UE to help off weight L LE and encourage quad activation -- x10    Modalities: With patient in long sitting and two large pads places over the quadriceps musculature. NMES 4 sec on/ 12 sec off with 2 sec ramp and an intensity of 15 mA for 51min with patient actively contracting during electrode  activation          PT Education - 01/02/17 1102    Education provided Yes   Education Details Educated on neurophysiology   Person(s) Educated Patient   Methods Explanation;Demonstration   Comprehension Verbalized understanding;Returned demonstration             PT Long Term Goals - 11/12/16 1304      PT LONG TERM GOAL #1   Title Pt will improve LEFS score by at least 9 points to demonstrate improved functional ability with LLE   Baseline 16/80   Time 6   Period Weeks   Status New     PT LONG TERM GOAL #2   Title Pt will demonstrate 5/5 L hip F, Abd, and Add strength for improved stability and functional use of LLE   Baseline Hip F: 4-/5, hip Abd: 4+/5, hip Add: 4-/5   Time 5   Period Weeks   Status New     PT LONG TERM GOAL #3   Title Pt will improve 66mWT to at least 1.0 m/s with use of AD to demonstrate gait speed   Baseline 0.35 m/s with RW   Time 8   Period Weeks   Status New     PT LONG TERM GOAL #4   Title Pt will improve 5xSTS to at least 12.4 sec to demonstrate improvement in balance and BLE strength   Baseline 27.4 sec     PT LONG TERM GOAL #5   Title L knee flexion and extension AROM will improve to WNL for improved mechanics with functional activities   Baseline Flexion: 82 deg, Extension: -9 deg   Time 6   Period Weeks   Status New               Plan - 01/02/17 1104    Clinical Impression Statement Patient demonstrates improvement of symptoms after performing modalities to encourage improved quadriceps activation. Patient demonstrates increased spasms and pain in her hamstring which improved after performing STM to the area. Patient will benefit from further skilled therapy focused on improving Quadriceps strength to return to prior level of function.    Rehab Potential Good   Clinical Impairments Affecting Rehab Potential (+) Pt with a very active lifstyle and motivation to return to PLOF (-) MD unable to repair 100% of damaged  meniscal tissue   PT Frequency 2x / week   PT Duration 8 weeks   PT Treatment/Interventions ADLs/Self Care Home Management;Aquatic Therapy;Biofeedback;Cryotherapy;Electrical Stimulation;Iontophoresis 4mg /ml Dexamethasone;Moist Heat;Ultrasound;Contrast Bath;DME Instruction;Gait training;Stair training;Functional mobility training;Therapeutic activities;Therapeutic exercise;Balance training;Neuromuscular re-education;Patient/family education;Orthotic Fit/Training;Manual techniques;Compression bandaging;Scar mobilization;Passive range of motion;Dry needling;Energy conservation;Taping   PT Next Visit Plan Manual therapy, stretching, strengthening, progress to single leg activities   PT Home Exercise Plan red hip extension, abduction   ytband resisted L TKE in standing, L single leg bridge, standing squats, prone quad stretch; (Regression: quad sets, glut sets,  sidelying hip abduction)   Consulted and Agree with Plan of Care Patient      Patient will benefit from skilled therapeutic intervention in order to improve the following deficits and impairments:  Abnormal gait, Decreased activity tolerance, Decreased balance, Decreased endurance, Decreased knowledge of precautions, Decreased knowledge of use of DME, Decreased mobility, Decreased range of motion, Decreased safety awareness, Decreased scar mobility, Decreased strength, Difficulty walking, Hypomobility, Increased edema, Increased fascial restricitons, Increased muscle spasms, Impaired perceived functional ability, Impaired flexibility, Impaired sensation, Improper body mechanics, Postural dysfunction, Pain  Visit Diagnosis: Left knee pain, unspecified chronicity  Other abnormalities of gait and mobility     Problem List Patient Active Problem List   Diagnosis Date Noted  . Pre-op examination 10/25/2016  . Bilateral knee pain 11/01/2015  . History of thrombocytopenia 11/01/2015  . AP (abdominal pain)   . Diverticulitis large intestine  10/01/2015    Blythe Stanford, PT DPT 01/02/2017, 11:08 AM  [facility] [address] [place], [address] Phone: [phone]   Fax:  [phone]  Name: Rebecca Watkins MRN: [id number] Date of Birth: 19-Dec-1955

## 2017-01-06 ENCOUNTER — Ambulatory Visit: Payer: 59

## 2017-01-06 DIAGNOSIS — M25562 Pain in left knee: Secondary | ICD-10-CM | POA: Diagnosis not present

## 2017-01-06 DIAGNOSIS — R2689 Other abnormalities of gait and mobility: Secondary | ICD-10-CM

## 2017-01-06 NOTE — Therapy (Signed)
[facility] [address] [place], [address] Phone: [phone]   Fax:  [phone]  Physical Therapy Treatment  Patient Details  Name: Rebecca Watkins MRN: [id number] Date of Birth: February 11, 1956 Referring Provider: Thornton Park, MD  Encounter Date: 01/06/2017      PT End of Session - 01/06/17 1140    Visit Number 13   Number of Visits 17   Date for PT Re-Evaluation 01/07/17   PT Start Time 1116   PT Stop Time 1200   PT Time Calculation (min) 44 min   Equipment Utilized During Treatment Other (comment)  L knee brace off entire session   Activity Tolerance Patient tolerated treatment well   Behavior During Therapy [facility] for tasks assessed/performed      Past Medical History:  Diagnosis Date  . Diverticulitis   . PONV (postoperative nausea and vomiting)   . Temporary low platelet count (HCC)    H/O    Past Surgical History:  Procedure Laterality Date  . COLONOSCOPY WITH PROPOFOL N/A 12/15/2015   Procedure: COLONOSCOPY WITH PROPOFOL;  Surgeon: Manya Silvas, MD;  Location: [facility] ENDOSCOPY;  Service: Endoscopy;  Laterality: N/A;  . KNEE ARTHROSCOPY WITH MEDIAL MENISECTOMY Left 10/31/2016   Procedure: KNEE ARTHROSCOPY WITH MEDIAL MENISECTOMY;  Surgeon: Thornton Park, MD;  Location: [facility] ORS;  Service: Orthopedics;  Laterality: Left;  . TUBAL LIGATION      There were no vitals filed for this visit.      Subjective Assessment - 01/06/17 1131    Subjective Patient report her L knee is feeling better and states she isn't getting as much pain when she ambulates.    Pertinent History Pt is s/p tear of L medial meniscus s/p L knee arthroscopy with partial medial menisectomy, extensive synovecomy, removal of medial and suprapatella plica, chondroplasty of medial femoral condyle and undersurface of patella on 10/31/16. Pt reports that she is WBAT per her MD.  No protocol provided from MD.  Pt is using polar  ice each day for a few hours off and on with pain relief.  Pt reports she can fall asleep at night after finding a comfortable position but is awoken up to 20x/night due to pain and pins and needles in LLE and pt has a hard time finding a comfortable position after this.  Pins and needles associated with numbness down to her L foot which is intermittent. Patient's goals include: return to exercising again, walking again, and back to work.  Pt reports occasional L knee buckle and MD instructed pt to wear knee brace until this improved which pt presents with on Eval today. Pt is using RW at all times saying, "I don't feel confident not using it all the time". Still has stabbing pains on the medial side of her L knee which is intermittent, previously this was constant.  Pt denies any previous hip, knee, or ankle injuries to either LE.   Limitations Walking;Standing;House hold activities   Patient Stated Goals to return to work, walking, exercising   Currently in Pain? No/denies        TREATMENT:   Manual Therapy: STM to the distal attachment/muscle belly of the hamstrings to decrease pain and spasms with patient positioned in long sitting  Therapeutic Exercise: With patient in sitting - SLR for 6 min; LAQ for 9 min with 10# weight over quadricep -- 3 maximal lift and holds (hold for 4 sec) every minute with use of modalities for 25min  Heel taps off of 4# step - 2 x 15  Retrostep ant/post weight shifts and focus on improving quadriceps activation -- x15 B Heel walking with UE to help off weight L LE and encourage quad activation -- x10   Modalities: With patient in long sitting and two large pads places over the quadriceps musculature. NMES 4 sec on/ 12 sec off with 2 sec ramp and an intensity of 10.6 mA for 98min with patient actively contracting during electrode activation  Patient demonstrates no increase in pain throughout therapy       PT Education - 01/06/17 1136    Education provided  Yes   Education Details form/technique with exercise   Person(s) Educated Patient   Methods Explanation;Demonstration   Comprehension Verbalized understanding;Returned demonstration             PT Long Term Goals - 11/12/16 1304      PT LONG TERM GOAL #1   Title Pt will improve LEFS score by at least 9 points to demonstrate improved functional ability with LLE   Baseline 16/80   Time 6   Period Weeks   Status New     PT LONG TERM GOAL #2   Title Pt will demonstrate 5/5 L hip F, Abd, and Add strength for improved stability and functional use of LLE   Baseline Hip F: 4-/5, hip Abd: 4+/5, hip Add: 4-/5   Time 5   Period Weeks   Status New     PT LONG TERM GOAL #3   Title Pt will improve 52mWT to at least 1.0 m/s with use of AD to demonstrate gait speed   Baseline 0.35 m/s with RW   Time 8   Period Weeks   Status New     PT LONG TERM GOAL #4   Title Pt will improve 5xSTS to at least 12.4 sec to demonstrate improvement in balance and BLE strength   Baseline 27.4 sec     PT LONG TERM GOAL #5   Title L knee flexion and extension AROM will improve to WNL for improved mechanics with functional activities   Baseline Flexion: 82 deg, Extension: -9 deg   Time 6   Period Weeks   Status New               Plan - 01/06/17 1159    Clinical Impression Statement Patient demonstrates improvement with knee function with ability to perform greater amount of stepping motions and heel taps without pain. Although patient is improving, she continues to demonstrate decreased quadriceps activation and strength; patient will benefit from further skilled therapy to return to prior level of function.    Rehab Potential Good   Clinical Impairments Affecting Rehab Potential (+) Pt with a very active lifstyle and motivation to return to PLOF (-) MD unable to repair 100% of damaged meniscal tissue   PT Frequency 2x / week   PT Duration 8 weeks   PT Treatment/Interventions ADLs/Self Care  Home Management;Aquatic Therapy;Biofeedback;Cryotherapy;Electrical Stimulation;Iontophoresis 4mg /ml Dexamethasone;Moist Heat;Ultrasound;Contrast Bath;DME Instruction;Gait training;Stair training;Functional mobility training;Therapeutic activities;Therapeutic exercise;Balance training;Neuromuscular re-education;Patient/family education;Orthotic Fit/Training;Manual techniques;Compression bandaging;Scar mobilization;Passive range of motion;Dry needling;Energy conservation;Taping   PT Next Visit Plan Manual therapy, stretching, strengthening, progress to single leg activities   PT Home Exercise Plan red hip extension, abduction   ytband resisted L TKE in standing, L single leg bridge, standing squats, prone quad stretch; (Regression: quad sets, glut sets, sidelying hip abduction)   Consulted and Agree with Plan of Care Patient  Patient will benefit from skilled therapeutic intervention in order to improve the following deficits and impairments:  Abnormal gait, Decreased activity tolerance, Decreased balance, Decreased endurance, Decreased knowledge of precautions, Decreased knowledge of use of DME, Decreased mobility, Decreased range of motion, Decreased safety awareness, Decreased scar mobility, Decreased strength, Difficulty walking, Hypomobility, Increased edema, Increased fascial restricitons, Increased muscle spasms, Impaired perceived functional ability, Impaired flexibility, Impaired sensation, Improper body mechanics, Postural dysfunction, Pain  Visit Diagnosis: Left knee pain, unspecified chronicity  Other abnormalities of gait and mobility     Problem List Patient Active Problem List   Diagnosis Date Noted  . Pre-op examination 10/25/2016  . Bilateral knee pain 11/01/2015  . History of thrombocytopenia 11/01/2015  . AP (abdominal pain)   . Diverticulitis large intestine 10/01/2015    Blythe Stanford, PT DPT 01/06/2017, 12:12 PM  [facility] [address] [place], [address] Phone: [phone]   Fax:  [phone]  Name: Rebecca Watkins MRN: [id number] Date of Birth: [birth year]

## 2017-01-09 ENCOUNTER — Ambulatory Visit: Payer: 59

## 2017-01-09 DIAGNOSIS — R2689 Other abnormalities of gait and mobility: Secondary | ICD-10-CM

## 2017-01-09 DIAGNOSIS — M25562 Pain in left knee: Secondary | ICD-10-CM | POA: Diagnosis not present

## 2017-01-09 NOTE — Therapy (Signed)
[facility] [address], [place], [address] Phone: [phone]   Fax:  [phone]  Physical Therapy Treatment  Patient Details  Name: Rebecca Watkins MRN: [id number] Date of Birth: 10-26-1955 Referring Provider: Thornton Park, MD  Encounter Date: 01/09/2017      PT End of Session - 01/09/17 1256    Visit Number 14   Number of Visits 25   Date for PT Re-Evaluation 02/06/17   PT Start Time 1118   PT Stop Time 1200   PT Time Calculation (min) 42 min   Equipment Utilized During Treatment Other (comment)  L knee brace off entire session   Activity Tolerance Patient tolerated treatment well   Behavior During Therapy [facility] for tasks assessed/performed      Past Medical History:  Diagnosis Date  . Diverticulitis   . PONV (postoperative nausea and vomiting)   . Temporary low platelet count (HCC)    H/O    Past Surgical History:  Procedure Laterality Date  . COLONOSCOPY WITH PROPOFOL N/A 12/15/2015   Procedure: COLONOSCOPY WITH PROPOFOL;  Surgeon: Manya Silvas, MD;  Location: [facility] ENDOSCOPY;  Service: Endoscopy;  Laterality: N/A;  . KNEE ARTHROSCOPY WITH MEDIAL MENISECTOMY Left 10/31/2016   Procedure: KNEE ARTHROSCOPY WITH MEDIAL MENISECTOMY;  Surgeon: Thornton Park, MD;  Location: [facility] ORS;  Service: Orthopedics;  Laterality: Left;  . TUBAL LIGATION      There were no vitals filed for this visit.      Subjective Assessment - 01/09/17 1146    Subjective Patient reports her knee is feeling okay reports she rarely feels pain in the medial aspect of her knee. Patient reports she beginning to feel a little pain along her patellar tendon.    Pertinent History Pt is s/p tear of L medial meniscus s/p L knee arthroscopy with partial medial menisectomy, extensive synovecomy, removal of medial and suprapatella plica, chondroplasty of medial femoral condyle and undersurface of patella on 10/31/16. Pt reports  that she is WBAT per her MD.  No protocol provided from MD.  Pt is using polar ice each day for a few hours off and on with pain relief.  Pt reports she can fall asleep at night after finding a comfortable position but is awoken up to 20x/night due to pain and pins and needles in LLE and pt has a hard time finding a comfortable position after this.  Pins and needles associated with numbness down to her L foot which is intermittent. Patient's goals include: return to exercising again, walking again, and back to work.  Pt reports occasional L knee buckle and MD instructed pt to wear knee brace until this improved which pt presents with on Eval today. Pt is using RW at all times saying, "I don't feel confident not using it all the time". Still has stabbing pains on the medial side of her L knee which is intermittent, previously this was constant.  Pt denies any previous hip, knee, or ankle injuries to either LE.   Limitations Walking;Standing;House hold activities   Patient Stated Goals to return to work, walking, exercising   Currently in Pain? No/denies         TREATMENT:  Therapeutic Exercise: With patient in sitting - SLR for 15 min with 10# weight over quadricep -- 3 maximal lift and holds (hold for 4 sec) every minute with use of modalities for 2min Hip machine hip abduction - x25 #25; x10 #25 on the R -stopped secondary  to pain Total gym squats - x10 at level 20 Heel walking with UE to help off weight L LE and encourage quad activation - x2 down and back Leg Press - quadriceps -x25 #45 Long axis distraction to the knee - x5 sec hold x 10   Modalities: With patient in long sitting and two large pads places over the quadriceps musculature. NMES 4 sec on/ 12 sec off with 2 sec ramp and an intensity of 10.6 mA for 37min with patient actively contracting during electrode activation   Patient demonstrates decrease in patellar femoral pain after manual distraction  Observation: LEF 24/80 MMT:  4/5 for all hip motions (4+/5 for abduction) 66mwt: .8 m/s 5xSTS: 17sec AROM: flexion: 100, extension: -2        PT Education - 01/09/17 1255    Education provided Yes   Education Details form/technique with exercise   Person(s) Educated Patient   Methods Explanation;Demonstration   Comprehension Verbalized understanding;Returned demonstration             PT Long Term Goals - 01/09/17 1259      PT LONG TERM GOAL #1   Title Pt will improve LEFS score by at least 9 points to demonstrate improved functional ability with LLE   Baseline 16/80; 01/09/17: 24/80   Time 6   Period Weeks   Status On-going     PT LONG TERM GOAL #2   Title Pt will demonstrate 5/5 L hip F, Abd, and Add strength for improved stability and functional use of LLE   Baseline Hip F: 4-/5, hip Abd: 4+/5, hip Add: 4-/5; 01/09/17: 4/5 for all hip motions (4+/5 for abduction)   Time 5   Period Weeks   Status On-going     PT LONG TERM GOAL #3   Title Pt will improve 3mWT to at least 1.0 m/s with use of AD to demonstrate gait speed   Baseline 0.35 m/s with RW 01/09/17: .8 m/s   Time 8   Period Weeks   Status On-going     PT LONG TERM GOAL #4   Title Pt will improve 5xSTS to at least 12.4 sec to demonstrate improvement in balance and BLE strength   Baseline 27.4 sec 01/09/17: 17sec   Status On-going     PT LONG TERM GOAL #5   Title L knee flexion and extension AROM will improve to WNL for improved mechanics with functional activities   Baseline Flexion: 82 deg, Extension: -9 deg 01/09/17: flexion: 100, extension: -2   Time 6   Period Weeks   Status On-going               Plan - 01/09/17 1309    Clinical Impression Statement Patient demonstrates improvement in long term goals and knee function with significant decrease in pain when performing exercises, improvement in LEFS, 55mwt, and MMT, AROM. Although patinet is improving, she continues to demonstrate increased knee pain and decrease in  functional strength. Patient will benefit from further skilled therapy focused on improving limitations to return to prior level of function.    Rehab Potential Good   Clinical Impairments Affecting Rehab Potential (+) Pt with a very active lifstyle and motivation to return to PLOF (-) MD unable to repair 100% of damaged meniscal tissue   PT Frequency 2x / week   PT Duration 8 weeks   PT Treatment/Interventions ADLs/Self Care Home Management;Aquatic Therapy;Biofeedback;Cryotherapy;Electrical Stimulation;Iontophoresis 4mg /ml Dexamethasone;Moist Heat;Ultrasound;Contrast Bath;DME Instruction;Gait training;Stair training;Functional mobility training;Therapeutic activities;Therapeutic exercise;Balance training;Neuromuscular re-education;Patient/family education;Orthotic Fit/Training;Manual  techniques;Compression bandaging;Scar mobilization;Passive range of motion;Dry needling;Energy conservation;Taping   PT Next Visit Plan Manual therapy, stretching, strengthening, progress to single leg activities   PT Home Exercise Plan red hip extension, abduction   ytband resisted L TKE in standing, L single leg bridge, standing squats, prone quad stretch; (Regression: quad sets, glut sets, sidelying hip abduction)   Consulted and Agree with Plan of Care Patient      Patient will benefit from skilled therapeutic intervention in order to improve the following deficits and impairments:  Abnormal gait, Decreased activity tolerance, Decreased balance, Decreased endurance, Decreased knowledge of precautions, Decreased knowledge of use of DME, Decreased mobility, Decreased range of motion, Decreased safety awareness, Decreased scar mobility, Decreased strength, Difficulty walking, Hypomobility, Increased edema, Increased fascial restricitons, Increased muscle spasms, Impaired perceived functional ability, Impaired flexibility, Impaired sensation, Improper body mechanics, Postural dysfunction, Pain  Visit Diagnosis: Left knee  pain, unspecified chronicity  Other abnormalities of gait and mobility     Problem List Patient Active Problem List   Diagnosis Date Noted  . Pre-op examination 10/25/2016  . Bilateral knee pain 11/01/2015  . History of thrombocytopenia 11/01/2015  . AP (abdominal pain)   . Diverticulitis large intestine 10/01/2015    Blythe Stanford, PT DPT 01/09/2017, 1:11 PM  [facility] [address], [place], [address] Phone: [phone]   Fax:  [phone]  Name: Deklynn Charlet MRN: [id number] Date of Birth: 1956-05-17

## 2017-01-09 NOTE — Addendum Note (Signed)
Addended by: Blain Pais on: 01/09/2017 01:13 PM   Modules accepted: Orders

## 2017-01-14 ENCOUNTER — Ambulatory Visit: Payer: 59

## 2017-01-14 DIAGNOSIS — M25562 Pain in left knee: Secondary | ICD-10-CM | POA: Diagnosis not present

## 2017-01-14 DIAGNOSIS — R2689 Other abnormalities of gait and mobility: Secondary | ICD-10-CM | POA: Diagnosis not present

## 2017-01-14 NOTE — Therapy (Signed)
[facility] [address], [place], [address] Phone: [phone]   Fax:  [phone]  Physical Therapy Treatment  Patient Details  Name: Rebecca Watkins MRN: [id number] Date of Birth: Sep 13, 1955 Referring Provider: Thornton Park, MD  Encounter Date: 01/14/2017      PT End of Session - 01/14/17 1055    Visit Number 15   Number of Visits 25   Date for PT Re-Evaluation 02/06/17   PT Start Time 6237   PT Stop Time 1100   PT Time Calculation (min) 45 min   Equipment Utilized During Treatment Other (comment)  L knee brace off entire session   Activity Tolerance Patient tolerated treatment well   Behavior During Therapy [facility] for tasks assessed/performed      Past Medical History:  Diagnosis Date  . Diverticulitis   . PONV (postoperative nausea and vomiting)   . Temporary low platelet count (HCC)    H/O    Past Surgical History:  Procedure Laterality Date  . COLONOSCOPY WITH PROPOFOL N/A 12/15/2015   Procedure: COLONOSCOPY WITH PROPOFOL;  Surgeon: Manya Silvas, MD;  Location: [facility] ENDOSCOPY;  Service: Endoscopy;  Laterality: N/A;  . KNEE ARTHROSCOPY WITH MEDIAL MENISECTOMY Left 10/31/2016   Procedure: KNEE ARTHROSCOPY WITH MEDIAL MENISECTOMY;  Surgeon: Thornton Park, MD;  Location: [facility] ORS;  Service: Orthopedics;  Laterality: Left;  . TUBAL LIGATION      There were no vitals filed for this visit.      Subjective Assessment - 01/14/17 1030    Subjective Patient reports her knee is feeling okay today. Reports the leg did not cause much pain this past weekend. Patient reports she wears her brace when walking.    Pertinent History Pt is s/p tear of L medial meniscus s/p L knee arthroscopy with partial medial menisectomy, extensive synovecomy, removal of medial and suprapatella plica, chondroplasty of medial femoral condyle and undersurface of patella on 10/31/16. Pt reports that she is WBAT per her  MD.  No protocol provided from MD.  Pt is using polar ice each day for a few hours off and on with pain relief.  Pt reports she can fall asleep at night after finding a comfortable position but is awoken up to 20x/night due to pain and pins and needles in LLE and pt has a hard time finding a comfortable position after this.  Pins and needles associated with numbness down to her L foot which is intermittent. Patient's goals include: return to exercising again, walking again, and back to work.  Pt reports occasional L knee buckle and MD instructed pt to wear knee brace until this improved which pt presents with on Eval today. Pt is using RW at all times saying, "I don't feel confident not using it all the time". Still has stabbing pains on the medial side of her L knee which is intermittent, previously this was constant.  Pt denies any previous hip, knee, or ankle injuries to either LE.   Limitations Walking;Standing;House hold activities   Patient Stated Goals to return to work, walking, exercising   Currently in Pain? No/denies        TREATMENT:   Therapeutic Exercise: With patient in sitting - SLR for 15 min with 10# weight over quadricep -- 3 maximal lift and holds (hold for 4 sec) every minute with use of modalities for 33min Monster walks with GTB around knees - 2 x 69ft B directions Total gym squats unilaterally - x14 at  level 20 Leg Press - quadriceps -x25 #45 Hip hikes off of 6" step - x 20 B Tandem Ambulation - 2  x 5ft    Modalities: With patient in long sitting and two large pads places over the quadriceps musculature. NMES 4 sec on/ 12 sec off with 2 sec ramp and an intensity of 10.6 mA for 21min with patient actively contracting during electrode activation        PT Education - 01/14/17 1055    Education provided Yes   Education Details Form/technique withe exercise   Person(s) Educated Patient   Methods Explanation;Demonstration   Comprehension Verbalized  understanding;Returned demonstration             PT Long Term Goals - 01/09/17 1259      PT LONG TERM GOAL #1   Title Pt will improve LEFS score by at least 9 points to demonstrate improved functional ability with LLE   Baseline 16/80; 01/09/17: 24/80   Time 6   Period Weeks   Status On-going     PT LONG TERM GOAL #2   Title Pt will demonstrate 5/5 L hip F, Abd, and Add strength for improved stability and functional use of LLE   Baseline Hip F: 4-/5, hip Abd: 4+/5, hip Add: 4-/5; 01/09/17: 4/5 for all hip motions (4+/5 for abduction)   Time 5   Period Weeks   Status On-going     PT LONG TERM GOAL #3   Title Pt will improve 82mWT to at least 1.0 m/s with use of AD to demonstrate gait speed   Baseline 0.35 m/s with RW 01/09/17: .8 m/s   Time 8   Period Weeks   Status On-going     PT LONG TERM GOAL #4   Title Pt will improve 5xSTS to at least 12.4 sec to demonstrate improvement in balance and BLE strength   Baseline 27.4 sec 01/09/17: 17sec   Status On-going     PT LONG TERM GOAL #5   Title L knee flexion and extension AROM will improve to WNL for improved mechanics with functional activities   Baseline Flexion: 82 deg, Extension: -9 deg 01/09/17: flexion: 100, extension: -2   Time 6   Period Weeks   Status On-going               Plan - 01/14/17 1056    Clinical Impression Statement Patient demonstrates improvement in long term goals with improved ability to perform greater amount of repeitions with leg press machine indicating functional carryover between teatment sessions. Patient demosntrates ability to perform greater amount of exercises before onset of fatigue and patient will benefit from further skilled therapy focused on improving current limitations to return to prior level of function.    Clinical Presentation Stable   Rehab Potential Good   Clinical Impairments Affecting Rehab Potential (+) Pt with a very active lifstyle and motivation to return to PLOF (-)  MD unable to repair 100% of damaged meniscal tissue   PT Frequency 2x / week   PT Duration 8 weeks   PT Treatment/Interventions ADLs/Self Care Home Management;Aquatic Therapy;Biofeedback;Cryotherapy;Electrical Stimulation;Iontophoresis 4mg /ml Dexamethasone;Moist Heat;Ultrasound;Contrast Bath;DME Instruction;Gait training;Stair training;Functional mobility training;Therapeutic activities;Therapeutic exercise;Balance training;Neuromuscular re-education;Patient/family education;Orthotic Fit/Training;Manual techniques;Compression bandaging;Scar mobilization;Passive range of motion;Dry needling;Energy conservation;Taping   PT Next Visit Plan Manual therapy, stretching, strengthening, progress to single leg activities   PT Home Exercise Plan red hip extension, abduction   ytband resisted L TKE in standing, L single leg bridge, standing squats, prone quad stretch; (Regression:  quad sets, glut sets, sidelying hip abduction)   Consulted and Agree with Plan of Care Patient      Patient will benefit from skilled therapeutic intervention in order to improve the following deficits and impairments:  Abnormal gait, Decreased activity tolerance, Decreased balance, Decreased endurance, Decreased knowledge of precautions, Decreased knowledge of use of DME, Decreased mobility, Decreased range of motion, Decreased safety awareness, Decreased scar mobility, Decreased strength, Difficulty walking, Hypomobility, Increased edema, Increased fascial restricitons, Increased muscle spasms, Impaired perceived functional ability, Impaired flexibility, Impaired sensation, Improper body mechanics, Postural dysfunction, Pain  Visit Diagnosis: Left knee pain, unspecified chronicity  Other abnormalities of gait and mobility     Problem List Patient Active Problem List   Diagnosis Date Noted  . Pre-op examination 10/25/2016  . Bilateral knee pain 11/01/2015  . History of thrombocytopenia 11/01/2015  . AP (abdominal pain)   .  Diverticulitis large intestine 10/01/2015    Blythe Stanford, PT DPT 01/14/2017, 5:12 PM  [facility] [address], [place], [address] Phone: [phone]   Fax:  [phone]  Name: Rebecca Watkins MRN: [id number] Date of Birth: 11/21/1955

## 2017-01-16 ENCOUNTER — Ambulatory Visit: Payer: 59

## 2017-01-16 DIAGNOSIS — M25562 Pain in left knee: Secondary | ICD-10-CM

## 2017-01-16 DIAGNOSIS — R2689 Other abnormalities of gait and mobility: Secondary | ICD-10-CM | POA: Diagnosis not present

## 2017-01-16 NOTE — Therapy (Signed)
[facility] [address], [place], [address] Phone: [phone]   Fax:  [phone]  Physical Therapy Treatment  Patient Details  Name: Rebecca Watkins MRN: [id number] Date of Birth: [birth year] Referring Provider: Thornton Park, MD  Encounter Date: 01/16/2017      PT End of Session - 01/16/17 1101    Visit Number 16   Number of Visits 25   Date for PT Re-Evaluation 02/06/17   PT Start Time 2683   PT Stop Time 1115   PT Time Calculation (min) 40 min   Equipment Utilized During Treatment Other (comment)  L knee brace off entire session   Activity Tolerance Patient tolerated treatment well   Behavior During Therapy [facility] for tasks assessed/performed      Past Medical History:  Diagnosis Date  . Diverticulitis   . PONV (postoperative nausea and vomiting)   . Temporary low platelet count (HCC)    H/O    Past Surgical History:  Procedure Laterality Date  . COLONOSCOPY WITH PROPOFOL N/A 12/15/2015   Procedure: COLONOSCOPY WITH PROPOFOL;  Surgeon: Manya Silvas, MD;  Location: [facility] ENDOSCOPY;  Service: Endoscopy;  Laterality: N/A;  . KNEE ARTHROSCOPY WITH MEDIAL MENISECTOMY Left 10/31/2016   Procedure: KNEE ARTHROSCOPY WITH MEDIAL MENISECTOMY;  Surgeon: Thornton Park, MD;  Location: [facility] ORS;  Service: Orthopedics;  Laterality: Left;  . TUBAL LIGATION      There were no vitals filed for this visit.      Subjective Assessment - 01/16/17 1052    Subjective Patient reports increased medial knee pain today most prominent when walking and standing.    Pertinent History Pt is s/p tear of L medial meniscus s/p L knee arthroscopy with partial medial menisectomy, extensive synovecomy, removal of medial and suprapatella plica, chondroplasty of medial femoral condyle and undersurface of patella on 10/31/16. Pt reports that she is WBAT per her MD.  No protocol provided from MD.  Pt is using polar ice each day  for a few hours off and on with pain relief.  Pt reports she can fall asleep at night after finding a comfortable position but is awoken up to 20x/night due to pain and pins and needles in LLE and pt has a hard time finding a comfortable position after this.  Pins and needles associated with numbness down to her L foot which is intermittent. Patient's goals include: return to exercising again, walking again, and back to work.  Pt reports occasional L knee buckle and MD instructed pt to wear knee brace until this improved which pt presents with on Eval today. Pt is using RW at all times saying, "I don't feel confident not using it all the time". Still has stabbing pains on the medial side of her L knee which is intermittent, previously this was constant.  Pt denies any previous hip, knee, or ankle injuries to either LE.   Limitations Walking;Standing;House hold activities   Patient Stated Goals to return to work, walking, exercising   Currently in Pain? No/denies         TREATMENT:   Therapeutic Exercise: With patient in sitting - SLR for 15 min with 10# weight over quadriceps, 2# around ankle  -- 3 maximal lift and holds (hold for 4 sec) every minute with use of modalities for 79min Total gym squats unilaterally - x14 at level 20 Leg Press - quadriceps -x25 #37, x 20 45# Hip hikes off of 3" step - x 15 on  L LE Step ups onto 3" step - x 15 with L LE     Modalities: With patient in long sitting and two large pads places over the quadriceps musculature. NMES 4 sec on/ 12 sec off with 2 sec ramp and an intensity of 10.6 mA for 40min with patient actively contracting during electrode activation  Manual Therapy: STM to the lateral hamstrings with the patient positioned in long sitting to decrease pain and spasms along the knee.         PT Education - 01/16/17 1100    Education provided Yes   Education Details Muscle physioology, importance of quad activation with exercise   Person(s)  Educated Patient   Methods Explanation   Comprehension Verbalized understanding;Returned demonstration             PT Long Term Goals - 01/09/17 1259      PT LONG TERM GOAL #1   Title Pt will improve LEFS score by at least 9 points to demonstrate improved functional ability with LLE   Baseline 16/80; 01/09/17: 24/80   Time 6   Period Weeks   Status On-going     PT LONG TERM GOAL #2   Title Pt will demonstrate 5/5 L hip F, Abd, and Add strength for improved stability and functional use of LLE   Baseline Hip F: 4-/5, hip Abd: 4+/5, hip Add: 4-/5; 01/09/17: 4/5 for all hip motions (4+/5 for abduction)   Time 5   Period Weeks   Status On-going     PT LONG TERM GOAL #3   Title Pt will improve 58mWT to at least 1.0 m/s with use of AD to demonstrate gait speed   Baseline 0.35 m/s with RW 01/09/17: .8 m/s   Time 8   Period Weeks   Status On-going     PT LONG TERM GOAL #4   Title Pt will improve 5xSTS to at least 12.4 sec to demonstrate improvement in balance and BLE strength   Baseline 27.4 sec 01/09/17: 17sec   Status On-going     PT LONG TERM GOAL #5   Title L knee flexion and extension AROM will improve to WNL for improved mechanics with functional activities   Baseline Flexion: 82 deg, Extension: -9 deg 01/09/17: flexion: 100, extension: -2   Time 6   Period Weeks   Status On-going               Plan - 01/16/17 1138    Clinical Impression Statement Continued to focus on improving quad activation with electrical stimulation and performing quad based exercises to improve quadriceps activation. Patient demonstrates improved quad activation after performing electrical stimulation. Patient has been demonstrating decreased pain with electrical stimulation. Patient will benefit from further skilled therapy to return to prior level of function.    Rehab Potential Good   Clinical Impairments Affecting Rehab Potential (+) Pt with a very active lifstyle and motivation to return  to PLOF (-) MD unable to repair 100% of damaged meniscal tissue   PT Frequency 2x / week   PT Duration 8 weeks   PT Treatment/Interventions ADLs/Self Care Home Management;Aquatic Therapy;Biofeedback;Cryotherapy;Electrical Stimulation;Iontophoresis 4mg /ml Dexamethasone;Moist Heat;Ultrasound;Contrast Bath;DME Instruction;Gait training;Stair training;Functional mobility training;Therapeutic activities;Therapeutic exercise;Balance training;Neuromuscular re-education;Patient/family education;Orthotic Fit/Training;Manual techniques;Compression bandaging;Scar mobilization;Passive range of motion;Dry needling;Energy conservation;Taping   PT Next Visit Plan Manual therapy, stretching, strengthening, progress to single leg activities   PT Home Exercise Plan red hip extension, abduction   ytband resisted L TKE in standing, L single leg bridge, standing  squats, prone quad stretch; (Regression: quad sets, glut sets, sidelying hip abduction)   Consulted and Agree with Plan of Care Patient      Patient will benefit from skilled therapeutic intervention in order to improve the following deficits and impairments:  Abnormal gait, Decreased activity tolerance, Decreased balance, Decreased endurance, Decreased knowledge of precautions, Decreased knowledge of use of DME, Decreased mobility, Decreased range of motion, Decreased safety awareness, Decreased scar mobility, Decreased strength, Difficulty walking, Hypomobility, Increased edema, Increased fascial restricitons, Increased muscle spasms, Impaired perceived functional ability, Impaired flexibility, Impaired sensation, Improper body mechanics, Postural dysfunction, Pain  Visit Diagnosis: Left knee pain, unspecified chronicity  Other abnormalities of gait and mobility     Problem List Patient Active Problem List   Diagnosis Date Noted  . Pre-op examination 10/25/2016  . Bilateral knee pain 11/01/2015  . History of thrombocytopenia 11/01/2015  . AP  (abdominal pain)   . Diverticulitis large intestine 10/01/2015    Blythe Stanford, PT DPT 01/16/2017, 12:50 PM  [facility] [address] [place], [address] Phone: [phone]   Fax:  [phone]  Name: Rebecca Watkins MRN: [id number] Date of Birth: [birth year]

## 2017-01-21 DIAGNOSIS — L821 Other seborrheic keratosis: Secondary | ICD-10-CM | POA: Diagnosis not present

## 2017-01-21 DIAGNOSIS — L820 Inflamed seborrheic keratosis: Secondary | ICD-10-CM | POA: Diagnosis not present

## 2017-01-21 DIAGNOSIS — D229 Melanocytic nevi, unspecified: Secondary | ICD-10-CM | POA: Diagnosis not present

## 2017-01-21 DIAGNOSIS — D1800 Hemangioma unspecified site: Secondary | ICD-10-CM | POA: Diagnosis not present

## 2017-01-21 DIAGNOSIS — Z85828 Personal history of other malignant neoplasm of skin: Secondary | ICD-10-CM | POA: Diagnosis not present

## 2017-01-29 ENCOUNTER — Ambulatory Visit: Payer: 59 | Attending: Orthopedic Surgery

## 2017-01-29 DIAGNOSIS — M25562 Pain in left knee: Secondary | ICD-10-CM | POA: Insufficient documentation

## 2017-01-29 DIAGNOSIS — R2689 Other abnormalities of gait and mobility: Secondary | ICD-10-CM | POA: Diagnosis not present

## 2017-01-29 DIAGNOSIS — M79602 Pain in left arm: Secondary | ICD-10-CM | POA: Diagnosis not present

## 2017-01-29 NOTE — Therapy (Signed)
[facility] [address], [place], [address] Phone: [phone]   Fax:  [phone]  Physical Therapy Treatment  Patient Details  Name: Rebecca Watkins MRN: [id number] Date of Birth: [birth year] Referring Provider: Thornton Park, MD  Encounter Date: 01/29/2017      PT End of Session - 01/29/17 1030    Visit Number 17   Number of Visits 25   Date for PT Re-Evaluation 02/06/17   PT Start Time 1000   PT Stop Time 1045   PT Time Calculation (min) 45 min   Equipment Utilized During Treatment Other (comment)  L knee brace off entire session   Activity Tolerance Patient tolerated treatment well   Behavior During Therapy [facility] for tasks assessed/performed      Past Medical History:  Diagnosis Date  . Diverticulitis   . PONV (postoperative nausea and vomiting)   . Temporary low platelet count (HCC)    H/O    Past Surgical History:  Procedure Laterality Date  . COLONOSCOPY WITH PROPOFOL N/A 12/15/2015   Procedure: COLONOSCOPY WITH PROPOFOL;  Surgeon: Manya Silvas, MD;  Location: [facility] ENDOSCOPY;  Service: Endoscopy;  Laterality: N/A;  . KNEE ARTHROSCOPY WITH MEDIAL MENISECTOMY Left 10/31/2016   Procedure: KNEE ARTHROSCOPY WITH MEDIAL MENISECTOMY;  Surgeon: Thornton Park, MD;  Location: [facility] ORS;  Service: Orthopedics;  Laterality: Left;  . TUBAL LIGATION      There were no vitals filed for this visit.      Subjective Assessment - 01/29/17 1018    Subjective Patient reports she continues to have increased medial knee pain when standing and walking. Patient states increased pain with weight bearing positions.    Pertinent History Pt is s/p tear of L medial meniscus s/p L knee arthroscopy with partial medial menisectomy, extensive synovecomy, removal of medial and suprapatella plica, chondroplasty of medial femoral condyle and undersurface of patella on 10/31/16. Pt reports that she is WBAT per her MD.   No protocol provided from MD.  Pt is using polar ice each day for a few hours off and on with pain relief.  Pt reports she can fall asleep at night after finding a comfortable position but is awoken up to 20x/night due to pain and pins and needles in LLE and pt has a hard time finding a comfortable position after this.  Pins and needles associated with numbness down to her L foot which is intermittent. Patient's goals include: return to exercising again, walking again, and back to work.  Pt reports occasional L knee buckle and MD instructed pt to wear knee brace until this improved which pt presents with on Eval today. Pt is using RW at all times saying, "I don't feel confident not using it all the time". Still has stabbing pains on the medial side of her L knee which is intermittent, previously this was constant.  Pt denies any previous hip, knee, or ankle injuries to either LE.   Limitations Walking;Standing;House hold activities   Patient Stated Goals to return to work, walking, exercising   Currently in Pain? No/denies        TREATMENT:   Therapeutic Exercise: With patient in sitting - SLR for 15 min with 10# weight over quadriceps, 2# around ankle  -- 3 maximal lift and holds (hold for 4 sec) every minute with use of modalities for 81min Bridges with RTB around knees - x25 Clamshells in sidelying on the R - x 25 Kneeling squat - x1  stopped secondary to increase medial knee pain.      Modalities: With patient in long sitting and two large pads places over the quadriceps musculature. NMES 4 sec on/ 12 sec off with 2 sec ramp and an intensity of 10.6 mA for 23min with patient actively contracting during electrode activation   Manual Therapy: STM to the medial aspect the knee and the upper part on the lower leg to decrease tenderness and spasms along the medial aspect of the leg/knee.       PT Education - 01/29/17 1028    Education provided Yes   Education Details Hip weakness causing  pain in the knee   Person(s) Educated Patient   Methods Explanation;Demonstration   Comprehension Verbalized understanding;Returned demonstration             PT Long Term Goals - 01/09/17 1259      PT LONG TERM GOAL #1   Title Pt will improve LEFS score by at least 9 points to demonstrate improved functional ability with LLE   Baseline 16/80; 01/09/17: 24/80   Time 6   Period Weeks   Status On-going     PT LONG TERM GOAL #2   Title Pt will demonstrate 5/5 L hip F, Abd, and Add strength for improved stability and functional use of LLE   Baseline Hip F: 4-/5, hip Abd: 4+/5, hip Add: 4-/5; 01/09/17: 4/5 for all hip motions (4+/5 for abduction)   Time 5   Period Weeks   Status On-going     PT LONG TERM GOAL #3   Title Pt will improve 58mWT to at least 1.0 m/s with use of AD to demonstrate gait speed   Baseline 0.35 m/s with RW 01/09/17: .8 m/s   Time 8   Period Weeks   Status On-going     PT LONG TERM GOAL #4   Title Pt will improve 5xSTS to at least 12.4 sec to demonstrate improvement in balance and BLE strength   Baseline 27.4 sec 01/09/17: 17sec   Status On-going     PT LONG TERM GOAL #5   Title L knee flexion and extension AROM will improve to WNL for improved mechanics with functional activities   Baseline Flexion: 82 deg, Extension: -9 deg 01/09/17: flexion: 100, extension: -2   Time 6   Period Weeks   Status On-going               Plan - 01/29/17 1032    Clinical Impression Statement Focused on improving hip endurance and strength today and educating patient on therapy after TKA and surgical information. Patient demonstrates increased quadricep and gluteal fatigue after performing table based hip strengthening exercises indicating decreased stabilization and endurance. Patint will benefit from further skilled therapy focused on improving quadricep and hip strengthening to return to prior level of function.    Rehab Potential Good   Clinical Impairments  Affecting Rehab Potential (+) Pt with a very active lifstyle and motivation to return to PLOF (-) MD unable to repair 100% of damaged meniscal tissue   PT Frequency 2x / week   PT Duration 8 weeks   PT Treatment/Interventions ADLs/Self Care Home Management;Aquatic Therapy;Biofeedback;Cryotherapy;Electrical Stimulation;Iontophoresis 4mg /ml Dexamethasone;Moist Heat;Ultrasound;Contrast Bath;DME Instruction;Gait training;Stair training;Functional mobility training;Therapeutic activities;Therapeutic exercise;Balance training;Neuromuscular re-education;Patient/family education;Orthotic Fit/Training;Manual techniques;Compression bandaging;Scar mobilization;Passive range of motion;Dry needling;Energy conservation;Taping   PT Next Visit Plan Manual therapy, stretching, strengthening, progress to single leg activities   PT Home Exercise Plan red hip extension, abduction   ytband resisted L TKE  in standing, L single leg bridge, standing squats, prone quad stretch; (Regression: quad sets, glut sets, sidelying hip abduction)   Consulted and Agree with Plan of Care Patient      Patient will benefit from skilled therapeutic intervention in order to improve the following deficits and impairments:  Abnormal gait, Decreased activity tolerance, Decreased balance, Decreased endurance, Decreased knowledge of precautions, Decreased knowledge of use of DME, Decreased mobility, Decreased range of motion, Decreased safety awareness, Decreased scar mobility, Decreased strength, Difficulty walking, Hypomobility, Increased edema, Increased fascial restricitons, Increased muscle spasms, Impaired perceived functional ability, Impaired flexibility, Impaired sensation, Improper body mechanics, Postural dysfunction, Pain  Visit Diagnosis: Left knee pain, unspecified chronicity  Pain in left arm  Other abnormalities of gait and mobility     Problem List Patient Active Problem List   Diagnosis Date Noted  . Pre-op examination  10/25/2016  . Bilateral knee pain 11/01/2015  . History of thrombocytopenia 11/01/2015  . AP (abdominal pain)   . Diverticulitis large intestine 10/01/2015    Blythe Stanford, PT DPT 01/29/2017, 11:01 AM  [facility] [address] [place], [address] Phone: [phone]   Fax:  [phone]  Name: Rebecca Watkins MRN: [id number] Date of Birth: Jan 09, 1956

## 2017-02-04 ENCOUNTER — Ambulatory Visit: Payer: 59

## 2017-02-04 DIAGNOSIS — M79602 Pain in left arm: Secondary | ICD-10-CM | POA: Diagnosis not present

## 2017-02-04 DIAGNOSIS — M25562 Pain in left knee: Secondary | ICD-10-CM | POA: Diagnosis not present

## 2017-02-04 DIAGNOSIS — R2689 Other abnormalities of gait and mobility: Secondary | ICD-10-CM

## 2017-02-04 NOTE — Therapy (Signed)
[facility] [address], [place], [address] Phone: [phone]   Fax:  [phone]  Physical Therapy Treatment  Patient Details  Name: Patryce Depriest MRN: [id number] Date of Birth: July 08, 1956 Referring Provider: Thornton Park, MD  Encounter Date: 02/04/2017      PT End of Session - 02/04/17 0917    Visit Number 18   Number of Visits 25   Date for PT Re-Evaluation 02/06/17   PT Start Time 0905   PT Stop Time 0945   PT Time Calculation (min) 40 min   Equipment Utilized During Treatment Other (comment)  L knee brace off entire session   Activity Tolerance Patient tolerated treatment well   Behavior During Therapy [facility] for tasks assessed/performed      Past Medical History:  Diagnosis Date  . Diverticulitis   . PONV (postoperative nausea and vomiting)   . Temporary low platelet count (HCC)    H/O    Past Surgical History:  Procedure Laterality Date  . COLONOSCOPY WITH PROPOFOL N/A 12/15/2015   Procedure: COLONOSCOPY WITH PROPOFOL;  Surgeon: Manya Silvas, MD;  Location: [facility] ENDOSCOPY;  Service: Endoscopy;  Laterality: N/A;  . KNEE ARTHROSCOPY WITH MEDIAL MENISECTOMY Left 10/31/2016   Procedure: KNEE ARTHROSCOPY WITH MEDIAL MENISECTOMY;  Surgeon: Thornton Park, MD;  Location: [facility] ORS;  Service: Orthopedics;  Laterality: Left;  . TUBAL LIGATION      There were no vitals filed for this visit.      Subjective Assessment - 02/04/17 0912    Subjective Patient reports she continues to have increased pain along the medial aspect of her knee. Patient states her pain has not improved considerably over the past two weeks.    Pertinent History Pt is s/p tear of L medial meniscus s/p L knee arthroscopy with partial medial menisectomy, extensive synovecomy, removal of medial and suprapatella plica, chondroplasty of medial femoral condyle and undersurface of patella on 10/31/16. Pt reports that she is  WBAT per her MD.  No protocol provided from MD.  Pt is using polar ice each day for a few hours off and on with pain relief.  Pt reports she can fall asleep at night after finding a comfortable position but is awoken up to 20x/night due to pain and pins and needles in LLE and pt has a hard time finding a comfortable position after this.  Pins and needles associated with numbness down to her L foot which is intermittent. Patient's goals include: return to exercising again, walking again, and back to work.  Pt reports occasional L knee buckle and MD instructed pt to wear knee brace until this improved which pt presents with on Eval today. Pt is using RW at all times saying, "I don't feel confident not using it all the time". Still has stabbing pains on the medial side of her L knee which is intermittent, previously this was constant.  Pt denies any previous hip, knee, or ankle injuries to either LE.   Limitations Walking;Standing;House hold activities   Patient Stated Goals to return to work, walking, exercising   Currently in Pain? No/denies        TREATMENT:   Therapeutic Exercise: With patient in sitting - SLR for 15 min with 10# weight over quadriceps -- 3 maximal lift and holds (hold for 4 sec) every minute with use of modalities for 69min Bridges with RTB around knees - x25 Clamshells in sidelying on the R - x 25 Kneeling squat -  x1 stopped secondary to increase medial knee pain.      Modalities: With patient in long sitting and two large pads places over the quadriceps musculature. [place] Stimulation: 4 sec on/ 12 sec off with 2 sec ramp and an intensity of 10.6 mA for 30min with patient actively contracting during electrode activation        PT Education - 02/04/17 0917    Education provided Yes   Education Details form/technique with exercise   Person(s) Educated Patient   Methods Explanation;Demonstration   Comprehension Verbalized understanding;Returned demonstration              PT Long Term Goals - 01/09/17 1259      PT LONG TERM GOAL #1   Title Pt will improve LEFS score by at least 9 points to demonstrate improved functional ability with LLE   Baseline 16/80; 01/09/17: 24/80   Time 6   Period Weeks   Status On-going     PT LONG TERM GOAL #2   Title Pt will demonstrate 5/5 L hip F, Abd, and Add strength for improved stability and functional use of LLE   Baseline Hip F: 4-/5, hip Abd: 4+/5, hip Add: 4-/5; 01/09/17: 4/5 for all hip motions (4+/5 for abduction)   Time 5   Period Weeks   Status On-going     PT LONG TERM GOAL #3   Title Pt will improve 79mWT to at least 1.0 m/s with use of AD to demonstrate gait speed   Baseline 0.35 m/s with RW 01/09/17: .8 m/s   Time 8   Period Weeks   Status On-going     PT LONG TERM GOAL #4   Title Pt will improve 5xSTS to at least 12.4 sec to demonstrate improvement in balance and BLE strength   Baseline 27.4 sec 01/09/17: 17sec   Status On-going     PT LONG TERM GOAL #5   Title L knee flexion and extension AROM will improve to WNL for improved mechanics with functional activities   Baseline Flexion: 82 deg, Extension: -9 deg 01/09/17: flexion: 100, extension: -2   Time 6   Period Weeks   Status On-going               Plan - 02/04/17 3532    Clinical Impression Statement Focused on improving gastroc/soleus and hamstring strength today to help address and decrease knee pain. Patient demonstrates decreased strength along those musculature, indicating increased knee dysfunction. Patient continues to demonstrate difficulty and pain with bending the knee and will benefit from further skilled therapy focused on improving LE strength to return to work.    Rehab Potential Good   Clinical Impairments Affecting Rehab Potential (+) Pt with a very active lifstyle and motivation to return to PLOF (-) MD unable to repair 100% of damaged meniscal tissue   PT Frequency 2x / week   PT Duration 8 weeks   PT  Treatment/Interventions ADLs/Self Care Home Management;Aquatic Therapy;Biofeedback;Cryotherapy;Electrical Stimulation;Iontophoresis 4mg /ml Dexamethasone;Moist Heat;Ultrasound;Contrast Bath;DME Instruction;Gait training;Stair training;Functional mobility training;Therapeutic activities;Therapeutic exercise;Balance training;Neuromuscular re-education;Patient/family education;Orthotic Fit/Training;Manual techniques;Compression bandaging;Scar mobilization;Passive range of motion;Dry needling;Energy conservation;Taping   PT Next Visit Plan Manual therapy, stretching, strengthening, progress to single leg activities   PT Home Exercise Plan red hip extension, abduction   ytband resisted L TKE in standing, L single leg bridge, standing squats, prone quad stretch; (Regression: quad sets, glut sets, sidelying hip abduction)   Consulted and Agree with Plan of Care Patient      Patient will benefit  from skilled therapeutic intervention in order to improve the following deficits and impairments:  Abnormal gait, Decreased activity tolerance, Decreased balance, Decreased endurance, Decreased knowledge of precautions, Decreased knowledge of use of DME, Decreased mobility, Decreased range of motion, Decreased safety awareness, Decreased scar mobility, Decreased strength, Difficulty walking, Hypomobility, Increased edema, Increased fascial restricitons, Increased muscle spasms, Impaired perceived functional ability, Impaired flexibility, Impaired sensation, Improper body mechanics, Postural dysfunction, Pain  Visit Diagnosis: Left knee pain, unspecified chronicity  Pain in left arm  Other abnormalities of gait and mobility     Problem List Patient Active Problem List   Diagnosis Date Noted  . Pre-op examination 10/25/2016  . Bilateral knee pain 11/01/2015  . History of thrombocytopenia 11/01/2015  . AP (abdominal pain)   . Diverticulitis large intestine 10/01/2015    Blythe Stanford, PT DPT 02/04/2017,  9:40 AM  [facility] [address], [place], [address] Phone: [phone]   Fax:  [phone]  Name: Tavie Haseman MRN: [id number] Date of Birth: 1955-10-26

## 2017-02-06 ENCOUNTER — Ambulatory Visit: Payer: 59

## 2017-02-06 DIAGNOSIS — M79602 Pain in left arm: Secondary | ICD-10-CM | POA: Diagnosis not present

## 2017-02-06 DIAGNOSIS — M25562 Pain in left knee: Secondary | ICD-10-CM

## 2017-02-06 DIAGNOSIS — R2689 Other abnormalities of gait and mobility: Secondary | ICD-10-CM | POA: Diagnosis not present

## 2017-02-06 NOTE — Therapy (Signed)
[facility] [address], [place], [address] Phone: [phone]   Fax:  [phone]  Physical Therapy Treatment  Patient Details  Name: Rebecca Watkins MRN: [id number] Date of Birth: 1956-02-12 Referring Provider: Thornton Park, MD  Encounter Date: 02/06/2017      PT End of Session - 02/06/17 1910    Visit Number 19   Number of Visits 25   Date for PT Re-Evaluation 02/06/17   PT Start Time 1815   PT Stop Time 1900   PT Time Calculation (min) 45 min   Equipment Utilized During Treatment Other (comment)  L knee brace off entire session   Activity Tolerance Patient tolerated treatment well   Behavior During Therapy [facility] for tasks assessed/performed      Past Medical History:  Diagnosis Date  . Diverticulitis   . PONV (postoperative nausea and vomiting)   . Temporary low platelet count (HCC)    H/O    Past Surgical History:  Procedure Laterality Date  . COLONOSCOPY WITH PROPOFOL N/A 12/15/2015   Procedure: COLONOSCOPY WITH PROPOFOL;  Surgeon: Manya Silvas, MD;  Location: [facility] ENDOSCOPY;  Service: Endoscopy;  Laterality: N/A;  . KNEE ARTHROSCOPY WITH MEDIAL MENISECTOMY Left 10/31/2016   Procedure: KNEE ARTHROSCOPY WITH MEDIAL MENISECTOMY;  Surgeon: Thornton Park, MD;  Location: [facility] ORS;  Service: Orthopedics;  Laterality: Left;  . TUBAL LIGATION      There were no vitals filed for this visit.      Subjective Assessment - 02/06/17 1908    Subjective Patient reports no current pain in the knee but states she had a few "twinges" in her knee.   Pertinent History Pt is s/p tear of L medial meniscus s/p L knee arthroscopy with partial medial menisectomy, extensive synovecomy, removal of medial and suprapatella plica, chondroplasty of medial femoral condyle and undersurface of patella on 10/31/16. Pt reports that she is WBAT per her MD.  No protocol provided from MD.  Pt is using polar ice each day  for a few hours off and on with pain relief.  Pt reports she can fall asleep at night after finding a comfortable position but is awoken up to 20x/night due to pain and pins and needles in LLE and pt has a hard time finding a comfortable position after this.  Pins and needles associated with numbness down to her L foot which is intermittent. Patient's goals include: return to exercising again, walking again, and back to work.  Pt reports occasional L knee buckle and MD instructed pt to wear knee brace until this improved which pt presents with on Eval today. Pt is using RW at all times saying, "I don't feel confident not using it all the time". Still has stabbing pains on the medial side of her L knee which is intermittent, previously this was constant.  Pt denies any previous hip, knee, or ankle injuries to either LE.   Limitations Walking;Standing;House hold activities   Patient Stated Goals to return to work, walking, exercising   Currently in Pain? No/denies        TREATMENT:   Therapeutic Exercise: With patient in sitting - SLR for 10 min with 10# weight over quadriceps, 2# around ankle  -- 3 maximal lift and holds (hold for 4 sec) every minute with use of modalities for 47min Standing knee flexion against YTB - x 20  Sitting YTB knee flexion - against YTB - x 20 Standing mini squats - x 20  Modalities: With patient in long sitting and two large pads places over the quadriceps musculature. [place] stim4 sec on/ 12 sec off with 2 sec ramp and an intensity of 15 mA for 57min with patient actively contracting during electrode activation  Iontophoresis: Placed along medial knee distal to the joint line with large patch worn home; patient educated on adverse reactions and proper response to reactions    Manual Therapy: STM to the medial aspect the knee and the upper part on the lower leg to decrease tenderness and spasms along the medial aspect of the leg/knee.  Patient demonstrates  decreased knee pain after performing seated knee flexion.         PT Education - 02/06/17 1909    Education provided Yes   Education Details Form/technique; handout on HEP   Person(s) Educated Patient   Methods Explanation;Demonstration;Handout   Comprehension Returned demonstration;Verbalized understanding             PT Long Term Goals - 01/09/17 1259      PT LONG TERM GOAL #1   Title Pt will improve LEFS score by at least 9 points to demonstrate improved functional ability with LLE   Baseline 16/80; 01/09/17: 24/80   Time 6   Period Weeks   Status On-going     PT LONG TERM GOAL #2   Title Pt will demonstrate 5/5 L hip F, Abd, and Add strength for improved stability and functional use of LLE   Baseline Hip F: 4-/5, hip Abd: 4+/5, hip Add: 4-/5; 01/09/17: 4/5 for all hip motions (4+/5 for abduction)   Time 5   Period Weeks   Status On-going     PT LONG TERM GOAL #3   Title Pt will improve 35mWT to at least 1.0 m/s with use of AD to demonstrate gait speed   Baseline 0.35 m/s with RW 01/09/17: .8 m/s   Time 8   Period Weeks   Status On-going     PT LONG TERM GOAL #4   Title Pt will improve 5xSTS to at least 12.4 sec to demonstrate improvement in balance and BLE strength   Baseline 27.4 sec 01/09/17: 17sec   Status On-going     PT LONG TERM GOAL #5   Title L knee flexion and extension AROM will improve to WNL for improved mechanics with functional activities   Baseline Flexion: 82 deg, Extension: -9 deg 01/09/17: flexion: 100, extension: -2   Time 6   Period Weeks   Status On-going               Plan - 02/06/17 1910    Clinical Impression Statement Patinet demonstrates decreased pain and spasms along her medial knee today after performing knee flexion in sitting and manual therapy. Patient continues to demonstrates increased pain when ambulating and will benefit from further skilled therapy to return to prior level of function.    Rehab Potential Good    Clinical Impairments Affecting Rehab Potential (+) Pt with a very active lifstyle and motivation to return to PLOF (-) MD unable to repair 100% of damaged meniscal tissue   PT Frequency 2x / week   PT Duration 8 weeks   PT Treatment/Interventions ADLs/Self Care Home Management;Aquatic Therapy;Biofeedback;Cryotherapy;Electrical Stimulation;Iontophoresis 4mg /ml Dexamethasone;Moist Heat;Ultrasound;Contrast Bath;DME Instruction;Gait training;Stair training;Functional mobility training;Therapeutic activities;Therapeutic exercise;Balance training;Neuromuscular re-education;Patient/family education;Orthotic Fit/Training;Manual techniques;Compression bandaging;Scar mobilization;Passive range of motion;Dry needling;Energy conservation;Taping   PT Next Visit Plan Manual therapy, stretching, strengthening, progress to single leg activities   PT Home Exercise Plan red  hip extension, abduction   ytband resisted L TKE in standing, L single leg bridge, standing squats, prone quad stretch; (Regression: quad sets, glut sets, sidelying hip abduction)   Consulted and Agree with Plan of Care Patient      Patient will benefit from skilled therapeutic intervention in order to improve the following deficits and impairments:  Abnormal gait, Decreased activity tolerance, Decreased balance, Decreased endurance, Decreased knowledge of precautions, Decreased knowledge of use of DME, Decreased mobility, Decreased range of motion, Decreased safety awareness, Decreased scar mobility, Decreased strength, Difficulty walking, Hypomobility, Increased edema, Increased fascial restricitons, Increased muscle spasms, Impaired perceived functional ability, Impaired flexibility, Impaired sensation, Improper body mechanics, Postural dysfunction, Pain  Visit Diagnosis: Left knee pain, unspecified chronicity  Pain in left arm  Other abnormalities of gait and mobility     Problem List Patient Active Problem List   Diagnosis Date Noted   . Pre-op examination 10/25/2016  . Bilateral knee pain 11/01/2015  . History of thrombocytopenia 11/01/2015  . AP (abdominal pain)   . Diverticulitis large intestine 10/01/2015    Blythe Stanford, PT DPT 02/06/2017, 7:12 PM  [facility] [address] [place], [address] Phone: [phone]   Fax:  [phone]  Name: Rebecca Watkins MRN: [id number] Date of Birth: [birth year]

## 2017-02-17 ENCOUNTER — Encounter: Payer: Self-pay | Admitting: Physical Therapy

## 2017-02-17 ENCOUNTER — Ambulatory Visit: Payer: 59 | Attending: Orthopedic Surgery | Admitting: Physical Therapy

## 2017-02-17 DIAGNOSIS — M79602 Pain in left arm: Secondary | ICD-10-CM | POA: Insufficient documentation

## 2017-02-17 DIAGNOSIS — M25562 Pain in left knee: Secondary | ICD-10-CM | POA: Diagnosis not present

## 2017-02-17 DIAGNOSIS — R2689 Other abnormalities of gait and mobility: Secondary | ICD-10-CM | POA: Insufficient documentation

## 2017-02-17 NOTE — Therapy (Signed)
[facility] [address] [place], [address] Phone: [phone]   Fax:  [phone]  Physical Therapy Treatment  Patient Details  Name: Rebecca Watkins MRN: [id number] Date of Birth: [birth year] Referring Provider: Thornton Park, MD  Encounter Date: 02/17/2017      PT End of Session - 02/17/17 1118    Visit Number 20   Number of Visits 33   Date for PT Re-Evaluation 03/17/17   PT Start Time 1117   PT Stop Time 1159   PT Time Calculation (min) 42 min   Equipment Utilized During Treatment Other (comment)  L knee brace off entire session   Activity Tolerance Patient tolerated treatment well   Behavior During Therapy [facility] for tasks assessed/performed      Past Medical History:  Diagnosis Date  . Diverticulitis   . PONV (postoperative nausea and vomiting)   . Temporary low platelet count (HCC)    H/O    Past Surgical History:  Procedure Laterality Date  . COLONOSCOPY WITH PROPOFOL N/A 12/15/2015   Procedure: COLONOSCOPY WITH PROPOFOL;  Surgeon: Manya Silvas, MD;  Location: [facility] ENDOSCOPY;  Service: Endoscopy;  Laterality: N/A;  . KNEE ARTHROSCOPY WITH MEDIAL MENISECTOMY Left 10/31/2016   Procedure: KNEE ARTHROSCOPY WITH MEDIAL MENISECTOMY;  Surgeon: Thornton Park, MD;  Location: [facility] ORS;  Service: Orthopedics;  Laterality: Left;  . TUBAL LIGATION      There were no vitals filed for this visit.      Subjective Assessment - 02/17/17 1121    Subjective Pt presents with small cut at top of L lower leg which she is unsure of method of injury.  She does not recall bumping into anything.  She noticed it initially on Friday evening.  It appears to be healing well.     Pertinent History Pt is s/p tear of L medial meniscus s/p L knee arthroscopy with partial medial menisectomy, extensive synovecomy, removal of medial and suprapatella plica, chondroplasty of medial femoral condyle and undersurface of  patella on 10/31/16. Pt reports that she is WBAT per her MD.  No protocol provided from MD.  Pt is using polar ice each day for a few hours off and on with pain relief.  Pt reports she can fall asleep at night after finding a comfortable position but is awoken up to 20x/night due to pain and pins and needles in LLE and pt has a hard time finding a comfortable position after this.  Pins and needles associated with numbness down to her L foot which is intermittent. Patient's goals include: return to exercising again, walking again, and back to work.  Pt reports occasional L knee buckle and MD instructed pt to wear knee brace until this improved which pt presents with on Eval today. Pt is using RW at all times saying, "I don't feel confident not using it all the time". Still has stabbing pains on the medial side of her L knee which is intermittent, previously this was constant.  Pt denies any previous hip, knee, or ankle injuries to either LE.   Limitations Walking;Standing;House hold activities   Patient Stated Goals to return to work, walking, exercising   Currently in Pain? Yes   Pain Score 3    Pain Location Knee   Pain Orientation Left   Pain Descriptors / Indicators Aching   Pain Type Chronic pain   Pain Onset More than a month ago   Pain Frequency Constant   Multiple Pain  Sites No      TREATMENT  Outcome measures completed and results explained to the pt:  LEFS: 35/80  39mT: 1.02 m/s without AD  5xSTS: 20.23 seconds without UE support  ?  L knee AROM (in deg):  F: 124  E: -1  ?  L hip strength:  F: 5/5  Abd: 4+/5  Add: 5/5  ?   Therapeutic Exercise:  Squats with TRX and chair for tactile cue 2x10  Standing knee flexion against RTB - x 20  Sitting YTB knee flexion - against RTB - x 20  Mini squats x10 with UE support  Manual Therapy:  STM L quad x8 minutes, pt reports relief following this          PT Education - 02/17/17 1117    Education provided Yes   Education  Details Exercise technique; results of outcome measures and re-assessment; POC moving forward   Person(s) Educated Patient   Methods Explanation;Demonstration;Verbal cues   Comprehension Verbalized understanding;Returned demonstration;Verbal cues required;Need further instruction             PT Long Term Goals - 02/17/17 1153      PT LONG TERM GOAL #1   Title Pt will improve LEFS score by at least 9 points to demonstrate improved functional ability with LLE   Baseline 16/80; 01/09/17: 24/80; 7/2:18: 35/80   Time 4   Period Weeks   Status On-going     PT LONG TERM GOAL #2   Title Pt will demonstrate 5/5 L hip F, Abd, and Add strength for improved stability and functional use of LLE   Baseline Hip F: 4-/5, hip Abd: 4+/5, hip Add: 4-/5; 01/09/17: 4/5 for all hip motions (4+/5 for abduction); 02/17/17: F 5/5, Abd 4+/5, Add 5/5   Time 4   Period Weeks   Status Partially Met     PT LONG TERM GOAL #3   Title Pt will improve 160m to at least 1.0 m/s with use of AD to demonstrate gait speed   Baseline 0.35 m/s with RW 01/09/17: .8 m/s; 02/17/17: 1.02 m/s   Time 8   Period Weeks   Status Achieved     PT LONG TERM GOAL #4   Title Pt will improve 5xSTS to at least 12.4 sec to demonstrate improvement in balance and BLE strength   Baseline 27.4 sec 01/09/17: 17sec; 02/17/17: 20.23 sec   Time 4   Period Weeks   Status On-going     PT LONG TERM GOAL #5   Title L knee flexion and extension AROM will improve to WNL for improved mechanics with functional activities   Baseline Flexion: 82 deg, Extension: -9 deg 01/09/17: flexion: 100, extension: -2; 02/17/17: F 124, E -1   Time 6   Period Weeks   Status On-going               Plan - 02/17/17 1152    Clinical Impression Statement Pt's re-assessment was completed today and pt demonstrated improvement in L knee AROM into flexion and extension since initiating therapy.  Her improvement on her LEFS score suggests improvement in functional  strength and use of LLE; however, she does demonstrate some remaining deficits in LE strength as her 5xSTS time did not improve.  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   Water engineer Good   Clinical Impairments Affecting Rehab Potential (+) Pt with a very active lifstyle and motivation to return to PLOF (-) MD unable to repair 100% of damaged meniscal tissue   PT Frequency 2x / week   PT Duration 4 weeks   PT Treatment/Interventions ADLs/Self Care Home Management;Aquatic Therapy;Biofeedback;Cryotherapy;Electrical Stimulation;Iontophoresis 64m/ml Dexamethasone;Moist Heat;Ultrasound;Contrast Bath;DME Instruction;Gait training;Stair training;Functional mobility training;Therapeutic activities;Therapeutic exercise;Balance training;Neuromuscular re-education;Patient/family education;Orthotic Fit/Training;Manual techniques;Compression bandaging;Scar mobilization;Passive range of motion;Dry needling;Energy conservation;Taping   PT Next Visit Plan Manual therapy, stretching, strengthening, progress to single leg activities   PT Home Exercise Plan red hip extension, abduction   ytband resisted L TKE in standing, L single leg bridge, standing squats, prone quad stretch; (Regression: quad sets, glut sets, sidelying hip abduction)   Consulted and Agree with Plan of Care Patient      Patient will benefit from skilled therapeutic intervention in order to improve the following deficits and impairments:  Abnormal gait, Decreased activity tolerance, Decreased balance, Decreased endurance, Decreased knowledge of precautions, Decreased knowledge of use of DME, Decreased mobility, Decreased range of motion, Decreased safety awareness, Decreased scar mobility, Decreased strength, Difficulty walking,  Hypomobility, Increased edema, Increased fascial restricitons, Increased muscle spasms, Impaired perceived functional ability, Impaired flexibility, Impaired sensation, Improper body mechanics, Postural dysfunction, Pain  Visit Diagnosis: Left knee pain, unspecified chronicity  Pain in left arm  Other abnormalities of gait and mobility     Problem List Patient Active Problem List   Diagnosis Date Noted  . Pre-op examination 10/25/2016  . Bilateral knee pain 11/01/2015  . History of thrombocytopenia 11/01/2015  . AP (abdominal pain)   . Diverticulitis large intestine 10/01/2015    ACollie SiadPT, DPT 02/17/2017, 12:10 PM  [facility] [address] [place] [address]Phone: [phone]  Fax:  [phone] Name: Rebecca KuennenMRN: [id number] of Birth: 102/11/1955

## 2017-02-20 ENCOUNTER — Ambulatory Visit: Payer: 59 | Admitting: Physical Therapy

## 2017-02-20 ENCOUNTER — Encounter: Payer: Self-pay | Admitting: Physical Therapy

## 2017-02-20 DIAGNOSIS — M25562 Pain in left knee: Secondary | ICD-10-CM | POA: Diagnosis not present

## 2017-02-20 DIAGNOSIS — M79602 Pain in left arm: Secondary | ICD-10-CM

## 2017-02-20 DIAGNOSIS — R2689 Other abnormalities of gait and mobility: Secondary | ICD-10-CM

## 2017-02-20 NOTE — Therapy (Signed)
[place] [facility] [address] [place], [address] Phone: [phone]   Fax:  [phone]  Physical Therapy Treatment  Patient Details  Name: Rebecca Watkins MRN: [id number] Date of Birth: February 03, 1956 Referring Provider: Thornton Park, MD  Encounter Date: 02/20/2017      PT End of Session - 02/20/17 1349    Visit Number 21   Number of Visits 33   Date for PT Re-Evaluation 03/17/17   PT Start Time 6962   PT Stop Time 1428   PT Time Calculation (min) 40 min   Equipment Utilized During Treatment Other (comment)  L knee brace off entire session   Activity Tolerance Patient tolerated treatment well   Behavior During Therapy [facility] for tasks assessed/performed      Past Medical History:  Diagnosis Date  . Diverticulitis   . PONV (postoperative nausea and vomiting)   . Temporary low platelet count (HCC)    H/O    Past Surgical History:  Procedure Laterality Date  . COLONOSCOPY WITH PROPOFOL N/A 12/15/2015   Procedure: COLONOSCOPY WITH PROPOFOL;  Surgeon: Manya Silvas, MD;  Location: [facility] ENDOSCOPY;  Service: Endoscopy;  Laterality: N/A;  . KNEE ARTHROSCOPY WITH MEDIAL MENISECTOMY Left 10/31/2016   Procedure: KNEE ARTHROSCOPY WITH MEDIAL MENISECTOMY;  Surgeon: Thornton Park, MD;  Location: [facility] ORS;  Service: Orthopedics;  Laterality: Left;  . TUBAL LIGATION      There were no vitals filed for this visit.      Subjective Assessment - 02/20/17 1351    Subjective Pt reports she is doing well.  The small cut on her L lower leg is healing well.  Pt reports she has been experiencing sharp pain in anterior R knee very rarely.   Pertinent History Pt is s/p tear of L medial meniscus s/p L knee arthroscopy with partial medial menisectomy, extensive synovecomy, removal of medial and suprapatella plica, chondroplasty of medial femoral condyle and undersurface of patella on 10/31/16. Pt reports that she is WBAT per her  MD.  No protocol provided from MD.  Pt is using polar ice each day for a few hours off and on with pain relief.  Pt reports she can fall asleep at night after finding a comfortable position but is awoken up to 20x/night due to pain and pins and needles in LLE and pt has a hard time finding a comfortable position after this.  Pins and needles associated with numbness down to her L foot which is intermittent. Patient's goals include: return to exercising again, walking again, and back to work.  Pt reports occasional L knee buckle and MD instructed pt to wear knee brace until this improved which pt presents with on Eval today. Pt is using RW at all times saying, "I don't feel confident not using it all the time". Still has stabbing pains on the medial side of her L knee which is intermittent, previously this was constant.  Pt denies any previous hip, knee, or ankle injuries to either LE.   Limitations Walking;Standing;House hold activities   Patient Stated Goals to return to work, walking, exercising   Currently in Pain? No/denies   Multiple Pain Sites No        TREATMENT?  ?  Therapeutic Exercise:  Squats with TRX and chair for tactile cue x30   Standing knee flexion against RTB - x 30   Sitting RTB knee flexion - against RTB - x 20   Mini squats x10 with UE  support. Pt reports R anterior knee pain which improves following cues and demonstration to evenly distribute weight through BLEs as pt tends to favor RLE.   SLS LLE 2x30 seconds with intermittent UE support and cues to align hips as pt initially with trendelenberg   L SLS on airex while moving RLE through walking motion x20 with intermittent UE support. Fatigue noted at end of exercise.   Step ups with LLE to 6" step x20   Backward diagonal walking with RTB around ankles x67f.     Manual Therapy:  STM L quad x8 minutes, pt reports relief following this            PT Education - 02/20/17 1349    Education provided Yes    Education Details Exercise technique; Instructed pt in R quad stretch in standing and to ice L and R knee following session   Person(s) Educated Patient   Methods Explanation;Demonstration;Verbal cues   Comprehension Verbalized understanding;Returned demonstration;Verbal cues required;Need further instruction             PT Long Term Goals - 02/17/17 1153      PT LONG TERM GOAL #1   Title Pt will improve LEFS score by at least 9 points to demonstrate improved functional ability with LLE   Baseline 16/80; 01/09/17: 24/80; 7/2:18: 35/80   Time 4   Period Weeks   Status On-going     PT LONG TERM GOAL #2   Title Pt will demonstrate 5/5 L hip F, Abd, and Add strength for improved stability and functional use of LLE   Baseline Hip F: 4-/5, hip Abd: 4+/5, hip Add: 4-/5; 01/09/17: 4/5 for all hip motions (4+/5 for abduction); 02/17/17: F 5/5, Abd 4+/5, Add 5/5   Time 4   Period Weeks   Status Partially Met     PT LONG TERM GOAL #3   Title Pt will improve 159m to at least 1.0 m/s with use of AD to demonstrate gait speed   Baseline 0.35 m/s with RW 01/09/17: .8 m/s; 02/17/17: 1.02 m/s   Time 8   Period Weeks   Status Achieved     PT LONG TERM GOAL #4   Title Pt will improve 5xSTS to at least 12.4 sec to demonstrate improvement in balance and BLE strength   Baseline 27.4 sec 01/09/17: 17sec; 02/17/17: 20.23 sec   Time 4   Period Weeks   Status On-going     PT LONG TERM GOAL #5   Title L knee flexion and extension AROM will improve to WNL for improved mechanics with functional activities   Baseline Flexion: 82 deg, Extension: -9 deg 01/09/17: flexion: 100, extension: -2; 02/17/17: F 124, E -1   Time 6   Period Weeks   Status On-going               Plan - 02/20/17 1437    Clinical Impression Statement Pt reports random and brief occasional R anterior knee pain and reported R knee knee pain with squatting exercise today.  Suspect this is due to compensatory behavior as pain decreases  with cues to weight shift evenly through BLEs when squatting.  Instructed pt to ice R knee and to perform R quad stretch to address stiffness and pain.  Pt demonstrates poor L glute recruitment which improves with verbal cues and demonstration when performing exercises today.  She will benefit from continued skilled PT interventions for improved strength, gait mechanics, decreased pain, and improved QOL.   Rehab  Potential Good   Clinical Impairments Affecting Rehab Potential (+) Pt with a very active lifstyle and motivation to return to PLOF (-) MD unable to repair 100% of damaged meniscal tissue   PT Frequency 2x / week   PT Duration 4 weeks   PT Treatment/Interventions ADLs/Self Care Home Management;Aquatic Therapy;Biofeedback;Cryotherapy;Electrical Stimulation;Iontophoresis 77m/ml Dexamethasone;Moist Heat;Ultrasound;Contrast Bath;DME Instruction;Gait training;Stair training;Functional mobility training;Therapeutic activities;Therapeutic exercise;Balance training;Neuromuscular re-education;Patient/family education;Orthotic Fit/Training;Manual techniques;Compression bandaging;Scar mobilization;Passive range of motion;Dry needling;Energy conservation;Taping   PT Next Visit Plan Manual therapy, stretching, strengthening, progress to single leg activities   PT Home Exercise Plan red hip extension, abduction   ytband resisted L TKE in standing, L single leg bridge, standing squats, prone quad stretch; (Regression: quad sets, glut sets, sidelying hip abduction)   Consulted and Agree with Plan of Care Patient      Patient will benefit from skilled therapeutic intervention in order to improve the following deficits and impairments:  Abnormal gait, Decreased activity tolerance, Decreased balance, Decreased endurance, Decreased knowledge of precautions, Decreased knowledge of use of DME, Decreased mobility, Decreased range of motion, Decreased safety awareness, Decreased scar mobility, Decreased strength,  Difficulty walking, Hypomobility, Increased edema, Increased fascial restricitons, Increased muscle spasms, Impaired perceived functional ability, Impaired flexibility, Impaired sensation, Improper body mechanics, Postural dysfunction, Pain  Visit Diagnosis: Left knee pain, unspecified chronicity  Pain in left arm  Other abnormalities of gait and mobility     Problem List Patient Active Problem List   Diagnosis Date Noted  . Pre-op examination 10/25/2016  . Bilateral knee pain 11/01/2015  . History of thrombocytopenia 11/01/2015  . AP (abdominal pain)   . Diverticulitis large intestine 10/01/2015    ACollie SiadPT, DPT 02/20/2017, 2:40 PM  [facility] [address] [place] [address]Phone: [phone]  Fax:  [phone] Name: Rebecca NatividadMRN: [id number] of Birth: [birth year]

## 2017-02-24 ENCOUNTER — Ambulatory Visit: Payer: 59

## 2017-02-24 DIAGNOSIS — M25562 Pain in left knee: Secondary | ICD-10-CM

## 2017-02-24 DIAGNOSIS — R2689 Other abnormalities of gait and mobility: Secondary | ICD-10-CM

## 2017-02-24 DIAGNOSIS — M79602 Pain in left arm: Secondary | ICD-10-CM | POA: Diagnosis not present

## 2017-02-24 NOTE — Therapy (Signed)
[place] [facility] [address], [place], [address] Phone: [phone]   Fax:  [phone]  Physical Therapy Treatment  Patient Details  Name: Rebecca Watkins MRN: [id number] Date of Birth: 02-Jul-1956 Referring Provider: Thornton Park, MD  Encounter Date: 02/24/2017      PT End of Session - 02/24/17 1143    Visit Number 22   Number of Visits 33   Date for PT Re-Evaluation 03/17/17   PT Start Time 1119   PT Stop Time 1200   PT Time Calculation (min) 41 min   Equipment Utilized During Treatment Other (comment)  L knee brace off entire session   Activity Tolerance Patient tolerated treatment well   Behavior During Therapy [facility] for tasks assessed/performed      Past Medical History:  Diagnosis Date  . Diverticulitis   . PONV (postoperative nausea and vomiting)   . Temporary low platelet count (HCC)    H/O    Past Surgical History:  Procedure Laterality Date  . COLONOSCOPY WITH PROPOFOL N/A 12/15/2015   Procedure: COLONOSCOPY WITH PROPOFOL;  Surgeon: Manya Silvas, MD;  Location: [facility] ENDOSCOPY;  Service: Endoscopy;  Laterality: N/A;  . KNEE ARTHROSCOPY WITH MEDIAL MENISECTOMY Left 10/31/2016   Procedure: KNEE ARTHROSCOPY WITH MEDIAL MENISECTOMY;  Surgeon: Thornton Park, MD;  Location: [facility] ORS;  Service: Orthopedics;  Laterality: Left;  . TUBAL LIGATION      There were no vitals filed for this visit.      Subjective Assessment - 02/24/17 1122    Subjective Patient reports she continues to have L knee pain and states difficulty with walking for long periods of time.    Pertinent History Pt is s/p tear of L medial meniscus s/p L knee arthroscopy with partial medial menisectomy, extensive synovecomy, removal of medial and suprapatella plica, chondroplasty of medial femoral condyle and undersurface of patella on 10/31/16. Pt reports that she is WBAT per her MD.  No protocol provided from MD.  Pt is using  polar ice each day for a few hours off and on with pain relief.  Pt reports she can fall asleep at night after finding a comfortable position but is awoken up to 20x/night due to pain and pins and needles in LLE and pt has a hard time finding a comfortable position after this.  Pins and needles associated with numbness down to her L foot which is intermittent. Patient's goals include: return to exercising again, walking again, and back to work.  Pt reports occasional L knee buckle and MD instructed pt to wear knee brace until this improved which pt presents with on Eval today. Pt is using RW at all times saying, "I don't feel confident not using it all the time". Still has stabbing pains on the medial side of her L knee which is intermittent, previously this was constant.  Pt denies any previous hip, knee, or ankle injuries to either LE.   Limitations Walking;Standing;House hold activities   Patient Stated Goals to return to work, walking, exercising   Currently in Pain? No/denies        TREATMENT?  ?  Therapeutic Exercise:  Squats with 2# ball with focus on hip flexion - x 20   [place] with focus on improving hamstring extension - x 20  Side reaching off of airex pad - x20 B   Standing knee flexion against 3# weight - x 20   Sitting RTB knee flexion - against RTB - x  20    Single leg marches on airex pad - x 20 B with focus on improving hip positioning  Single leg ball circle with suspended foot - x 20   Hip hikes off of 3" step - x 20     Manual Therapy:  STM L quad x8 minutes with patient positioned in long sitting ultilizing superficial techniques to decrease pain and spasms        PT Education - 02/24/17 1141    Education provided Yes   Education Details HEP: hip hikiing   Person(s) Educated Patient   Methods Explanation;Demonstration   Comprehension Verbalized understanding;Returned demonstration             PT Long Term Goals - 02/17/17 1153      PT LONG  TERM GOAL #1   Title Pt will improve LEFS score by at least 9 points to demonstrate improved functional ability with LLE   Baseline 16/80; 01/09/17: 24/80; 7/2:18: 35/80   Time 4   Period Weeks   Status On-going     PT LONG TERM GOAL #2   Title Pt will demonstrate 5/5 L hip F, Abd, and Add strength for improved stability and functional use of LLE   Baseline Hip F: 4-/5, hip Abd: 4+/5, hip Add: 4-/5; 01/09/17: 4/5 for all hip motions (4+/5 for abduction); 02/17/17: F 5/5, Abd 4+/5, Add 5/5   Time 4   Period Weeks   Status Partially Met     PT LONG TERM GOAL #3   Title Pt will improve 72mT to at least 1.0 m/s with use of AD to demonstrate gait speed   Baseline 0.35 m/s with RW 01/09/17: .8 m/s; 02/17/17: 1.02 m/s   Time 8   Period Weeks   Status Achieved     PT LONG TERM GOAL #4   Title Pt will improve 5xSTS to at least 12.4 sec to demonstrate improvement in balance and BLE strength   Baseline 27.4 sec 01/09/17: 17sec; 02/17/17: 20.23 sec   Time 4   Period Weeks   Status On-going     PT LONG TERM GOAL #5   Title L knee flexion and extension AROM will improve to WNL for improved mechanics with functional activities   Baseline Flexion: 82 deg, Extension: -9 deg 01/09/17: flexion: 100, extension: -2; 02/17/17: F 124, E -1   Time 6   Period Weeks   Status On-going               Plan - 02/24/17 1150    Clinical Impression Statement Pt reports intermittent knee pain with standing but decreased knee pain with performing airx pad exercises indicating improved hip muscular activation improving knee positioning. Patient reports R knee beginning to hurt thus performed hip stabilization exercises bilaterally to attempt to improve. Patient requires cueing on hip positioning throughout session and patient will benefit from further skilled therapy to return to prior level of function.    Rehab Potential Good   Clinical Impairments Affecting Rehab Potential (+) Pt with a very active lifstyle and  motivation to return to PLOF (-) MD unable to repair 100% of damaged meniscal tissue   PT Frequency 2x / week   PT Duration 4 weeks   PT Treatment/Interventions ADLs/Self Care Home Management;Aquatic Therapy;Biofeedback;Cryotherapy;Electrical Stimulation;Iontophoresis 43mml Dexamethasone;Moist Heat;Ultrasound;Contrast Bath;DME Instruction;Gait training;Stair training;Functional mobility training;Therapeutic activities;Therapeutic exercise;Balance training;Neuromuscular re-education;Patient/family education;Orthotic Fit/Training;Manual techniques;Compression bandaging;Scar mobilization;Passive range of motion;Dry needling;Energy conservation;Taping   PT Next Visit Plan Manual therapy, stretching, strengthening, progress to single leg activities  PT Home Exercise Plan red hip extension, abduction   ytband resisted L TKE in standing, L single leg bridge, standing squats, prone quad stretch; (Regression: quad sets, glut sets, sidelying hip abduction)   Consulted and Agree with Plan of Care Patient      Patient will benefit from skilled therapeutic intervention in order to improve the following deficits and impairments:  Abnormal gait, Decreased activity tolerance, Decreased balance, Decreased endurance, Decreased knowledge of precautions, Decreased knowledge of use of DME, Decreased mobility, Decreased range of motion, Decreased safety awareness, Decreased scar mobility, Decreased strength, Difficulty walking, Hypomobility, Increased edema, Increased fascial restricitons, Increased muscle spasms, Impaired perceived functional ability, Impaired flexibility, Impaired sensation, Improper body mechanics, Postural dysfunction, Pain  Visit Diagnosis: Left knee pain, unspecified chronicity  Other abnormalities of gait and mobility     Problem List Patient Active Problem List   Diagnosis Date Noted  . Pre-op examination 10/25/2016  . Bilateral knee pain 11/01/2015  . History of thrombocytopenia  11/01/2015  . AP (abdominal pain)   . Diverticulitis large intestine 10/01/2015    Blythe Stanford, PT DPT 02/24/2017, 12:07 PM  [place] [facility] [address] [place], [address] Phone: [phone]   Fax:  [phone]  Name: Patirica Longshore MRN: [id number] Date of Birth: 04/25/1956

## 2017-02-25 ENCOUNTER — Ambulatory Visit: Payer: 59

## 2017-02-25 DIAGNOSIS — R2689 Other abnormalities of gait and mobility: Secondary | ICD-10-CM | POA: Diagnosis not present

## 2017-02-25 DIAGNOSIS — M25562 Pain in left knee: Secondary | ICD-10-CM | POA: Diagnosis not present

## 2017-02-25 DIAGNOSIS — M79602 Pain in left arm: Secondary | ICD-10-CM | POA: Diagnosis not present

## 2017-02-25 NOTE — Therapy (Signed)
[place] [facility] [address] [place], [address] Phone: [phone]   Fax:  [phone]  Physical Therapy Treatment  Patient Details  Name: Rebecca Watkins MRN: [id number] Date of Birth: 03/17/1956 Referring Provider: Thornton Park, MD  Encounter Date: 02/25/2017      PT End of Session - 02/25/17 1605    Visit Number 23   Number of Visits 33   Date for PT Re-Evaluation 03/17/17   PT Start Time 1520   PT Stop Time 1600   PT Time Calculation (min) 40 min   Equipment Utilized During Treatment Other (comment)  L knee brace off entire session   Activity Tolerance Patient tolerated treatment well   Behavior During Therapy [facility] for tasks assessed/performed      Past Medical History:  Diagnosis Date  . Diverticulitis   . PONV (postoperative nausea and vomiting)   . Temporary low platelet count (HCC)    H/O    Past Surgical History:  Procedure Laterality Date  . COLONOSCOPY WITH PROPOFOL N/A 12/15/2015   Procedure: COLONOSCOPY WITH PROPOFOL;  Surgeon: Manya Silvas, MD;  Location: [facility] ENDOSCOPY;  Service: Endoscopy;  Laterality: N/A;  . KNEE ARTHROSCOPY WITH MEDIAL MENISECTOMY Left 10/31/2016   Procedure: KNEE ARTHROSCOPY WITH MEDIAL MENISECTOMY;  Surgeon: Thornton Park, MD;  Location: [facility] ORS;  Service: Orthopedics;  Laterality: Left;  . TUBAL LIGATION      There were no vitals filed for this visit.      Subjective Assessment - 02/25/17 1554    Subjective Patient reports no major changes since the previous visit.    Pertinent History Pt is s/p tear of L medial meniscus s/p L knee arthroscopy with partial medial menisectomy, extensive synovecomy, removal of medial and suprapatella plica, chondroplasty of medial femoral condyle and undersurface of patella on 10/31/16. Pt reports that she is WBAT per her MD.  No protocol provided from MD.  Pt is using polar ice each day for a few hours off and on with  pain relief.  Pt reports she can fall asleep at night after finding a comfortable position but is awoken up to 20x/night due to pain and pins and needles in LLE and pt has a hard time finding a comfortable position after this.  Pins and needles associated with numbness down to her L foot which is intermittent. Patient's goals include: return to exercising again, walking again, and back to work.  Pt reports occasional L knee buckle and MD instructed pt to wear knee brace until this improved which pt presents with on Eval today. Pt is using RW at all times saying, "I don't feel confident not using it all the time". Still has stabbing pains on the medial side of her L knee which is intermittent, previously this was constant.  Pt denies any previous hip, knee, or ankle injuries to either LE.   Limitations Walking;Standing;House hold activities   Patient Stated Goals to return to work, walking, exercising   Currently in Pain? No/denies        TREATMENT?  ?  Therapeutic Exercise:   Total Gym squats B LE's - 2 x 30 level 20, 23  Seated hamstring curls in sitting - GTB - x 20   [place] with focus on improving hamstring extension - x 20  Standing knee flexion against 3# weight - x 20  Standing lunges on small spikey bosu ball - x 20  Heel taps off of airex pad - x  20   Single leg marches on airex pad - x 20 B with focus on improving hip positioning  Patient demonstrates increased LE fatigue at end of session       PT Education - 02/25/17 1605    Education provided Yes   Education Details form/technique with exercise   Person(s) Educated Patient   Methods Explanation;Demonstration   Comprehension Verbalized understanding;Returned demonstration             PT Long Term Goals - 02/17/17 1153      PT LONG TERM GOAL #1   Title Pt will improve LEFS score by at least 9 points to demonstrate improved functional ability with LLE   Baseline 16/80; 01/09/17: 24/80; 7/2:18: 35/80    Time 4   Period Weeks   Status On-going     PT LONG TERM GOAL #2   Title Pt will demonstrate 5/5 L hip F, Abd, and Add strength for improved stability and functional use of LLE   Baseline Hip F: 4-/5, hip Abd: 4+/5, hip Add: 4-/5; 01/09/17: 4/5 for all hip motions (4+/5 for abduction); 02/17/17: F 5/5, Abd 4+/5, Add 5/5   Time 4   Period Weeks   Status Partially Met     PT LONG TERM GOAL #3   Title Pt will improve 68mT to at least 1.0 m/s with use of AD to demonstrate gait speed   Baseline 0.35 m/s with RW 01/09/17: .8 m/s; 02/17/17: 1.02 m/s   Time 8   Period Weeks   Status Achieved     PT LONG TERM GOAL #4   Title Pt will improve 5xSTS to at least 12.4 sec to demonstrate improvement in balance and BLE strength   Baseline 27.4 sec 01/09/17: 17sec; 02/17/17: 20.23 sec   Time 4   Period Weeks   Status On-going     PT LONG TERM GOAL #5   Title L knee flexion and extension AROM will improve to WNL for improved mechanics with functional activities   Baseline Flexion: 82 deg, Extension: -9 deg 01/09/17: flexion: 100, extension: -2; 02/17/17: F 124, E -1   Time 6   Period Weeks   Status On-going               Plan - 02/25/17 1712    Clinical Impression Statement Pt demonstrates increased knee pain with increased knee flexion in standing indicating decreased quariceps strength and coordination. Patient demonstrates less pain to no pain after decreasing AROM of performed exercises and patient will benefit from further skilled therapy focused on improving LE strength to return to prior level of function.    Rehab Potential Good   Clinical Impairments Affecting Rehab Potential (+) Pt with a very active lifstyle and motivation to return to PLOF (-) MD unable to repair 100% of damaged meniscal tissue   PT Frequency 2x / week   PT Duration 4 weeks   PT Treatment/Interventions ADLs/Self Care Home Management;Aquatic Therapy;Biofeedback;Cryotherapy;Electrical Stimulation;Iontophoresis 46mml  Dexamethasone;Moist Heat;Ultrasound;Contrast Bath;DME Instruction;Gait training;Stair training;Functional mobility training;Therapeutic activities;Therapeutic exercise;Balance training;Neuromuscular re-education;Patient/family education;Orthotic Fit/Training;Manual techniques;Compression bandaging;Scar mobilization;Passive range of motion;Dry needling;Energy conservation;Taping   PT Next Visit Plan Manual therapy, stretching, strengthening, progress to single leg activities   PT Home Exercise Plan red hip extension, abduction   ytband resisted L TKE in standing, L single leg bridge, standing squats, prone quad stretch; (Regression: quad sets, glut sets, sidelying hip abduction)   Consulted and Agree with Plan of Care Patient      Patient will benefit from  skilled therapeutic intervention in order to improve the following deficits and impairments:  Abnormal gait, Decreased activity tolerance, Decreased balance, Decreased endurance, Decreased knowledge of precautions, Decreased knowledge of use of DME, Decreased mobility, Decreased range of motion, Decreased safety awareness, Decreased scar mobility, Decreased strength, Difficulty walking, Hypomobility, Increased edema, Increased fascial restricitons, Increased muscle spasms, Impaired perceived functional ability, Impaired flexibility, Impaired sensation, Improper body mechanics, Postural dysfunction, Pain  Visit Diagnosis: Left knee pain, unspecified chronicity  Other abnormalities of gait and mobility     Problem List Patient Active Problem List   Diagnosis Date Noted  . Pre-op examination 10/25/2016  . Bilateral knee pain 11/01/2015  . History of thrombocytopenia 11/01/2015  . AP (abdominal pain)   . Diverticulitis large intestine 10/01/2015    Blythe Stanford, PT DPT 02/25/2017, 5:15 PM  [place] [facility] [address] [place], [address] Phone: [phone]   Fax:   [phone]  Name: Rebecca Watkins MRN: [id number] Date of Birth: [birth year]

## 2017-02-27 ENCOUNTER — Ambulatory Visit: Payer: 59

## 2017-03-10 DIAGNOSIS — M25562 Pain in left knee: Secondary | ICD-10-CM | POA: Diagnosis not present

## 2017-03-10 DIAGNOSIS — M7052 Other bursitis of knee, left knee: Secondary | ICD-10-CM | POA: Diagnosis not present

## 2017-03-10 DIAGNOSIS — M1712 Unilateral primary osteoarthritis, left knee: Secondary | ICD-10-CM | POA: Diagnosis not present

## 2017-03-24 ENCOUNTER — Telehealth: Payer: Self-pay | Admitting: Family Medicine

## 2017-03-24 DIAGNOSIS — T148XXA Other injury of unspecified body region, initial encounter: Secondary | ICD-10-CM

## 2017-03-24 NOTE — Telephone Encounter (Signed)
Left message to return call 

## 2017-03-24 NOTE — Telephone Encounter (Signed)
Patient is scheduled   

## 2017-03-24 NOTE — Telephone Encounter (Signed)
Please advise 

## 2017-03-24 NOTE — Telephone Encounter (Signed)
Pt called wanting to get some labs done to check her platelets. Pt is having a lot of busing. Please advise?  Call pt @ [phone]. Thank you!

## 2017-03-24 NOTE — Telephone Encounter (Signed)
I am happy to order this though I would suggest follow-up in the office to be evaluated. Order has been placed.

## 2017-03-24 NOTE — Telephone Encounter (Signed)
Please call pt at [phone]

## 2017-03-25 ENCOUNTER — Ambulatory Visit (INDEPENDENT_AMBULATORY_CARE_PROVIDER_SITE_OTHER): Payer: 59 | Admitting: Family Medicine

## 2017-03-25 ENCOUNTER — Encounter: Payer: Self-pay | Admitting: Family Medicine

## 2017-03-25 VITALS — BP 130/72 | HR 77 | Temp 98.1°F | Wt 147.8 lb

## 2017-03-25 DIAGNOSIS — T148XXA Other injury of unspecified body region, initial encounter: Secondary | ICD-10-CM | POA: Diagnosis not present

## 2017-03-25 LAB — CBC
HCT: 42.2 % (ref 36.0–46.0)
Hemoglobin: 13.8 g/dL (ref 12.0–15.0)
MCHC: 32.8 g/dL (ref 30.0–36.0)
MCV: 98.4 fl (ref 78.0–100.0)
PLATELETS: 143 10*3/uL — AB (ref 150.0–400.0)
RBC: 4.28 Mil/uL (ref 3.87–5.11)
RDW: 13.1 % (ref 11.5–15.5)
WBC: 6.1 10*3/uL (ref 4.0–10.5)

## 2017-03-25 LAB — PROTIME-INR
INR: 0.9 ratio (ref 0.8–1.0)
PROTHROMBIN TIME: 9.9 s (ref 9.6–13.1)

## 2017-03-25 NOTE — Patient Instructions (Signed)
Nice to see you. We'll get some lab work today and contact you with the results. If you have excessive bleeding which you are unable to get stopped please seek medical attention.

## 2017-03-25 NOTE — Progress Notes (Signed)
  Tommi Rumps, MD Phone: [phone]  Rebecca Watkins is a 61 y.o. female who presents today for same-day visit.  Patient notes increased bruising over the last 2 months. Notes she'll barely bump herself and she will bruise. She's been on naproxen since she had knee surgery and she is taking is 1-2 times a day and she thinks this could be contributing. She is not taking aspirin. She has lost some weight though it has been intentional to help with her knees. She notes no night sweats. She was previously evaluated by hematology for this issue and notes the workup was negative.  ROS see history of present illness  Objective  Physical Exam Vitals:   03/25/17 1014  BP: 130/72  Pulse: 77  Temp: 98.1 F (36.7 C)    BP Readings from Last 3 Encounters:  03/25/17 130/72  10/31/16 (!) 119/58  10/25/16 122/80   Wt Readings from Last 3 Encounters:  03/25/17 147 lb 12.8 oz (67 kg)  10/25/16 166 lb 3.2 oz (75.4 kg)  09/30/16 150 lb (68 kg)    Physical Exam  Constitutional: No distress.  Neck: Neck supple.  Cardiovascular: Normal rate, regular rhythm and normal heart sounds.   Pulmonary/Chest: Effort normal and breath sounds normal.  Lymphadenopathy:    She has no cervical adenopathy.  Neurological: She is alert. Gait normal.  Skin: She is not diaphoretic.  Scattered bruises on her legs and arms     Assessment/Plan: Please see individual problem list.  Bruising Patient with increased bruising over the last couple of months. Potentially could be related to her use of naproxen though could also be related to thrombocytopenia. We'll check a CBC and will check an INR as well. We'll determine the next step based off of these labs. She will likely need an alternative anti-inflammatory. Given return precautions.   Orders Placed This Encounter  Procedures  . INR/PT   Tommi Rumps, MD [place]

## 2017-03-25 NOTE — Assessment & Plan Note (Signed)
Patient with increased bruising over the last couple of months. Potentially could be related to her use of naproxen though could also be related to thrombocytopenia. We'll check a CBC and will check an INR as well. We'll determine the next step based off of these labs. She will likely need an alternative anti-inflammatory. Given return precautions.

## 2017-04-03 ENCOUNTER — Other Ambulatory Visit: Payer: 59

## 2017-04-03 ENCOUNTER — Telehealth: Payer: Self-pay

## 2017-04-03 DIAGNOSIS — D696 Thrombocytopenia, unspecified: Secondary | ICD-10-CM

## 2017-04-03 NOTE — Telephone Encounter (Signed)
Patient called to schedule recheck lab appmt, please place order, platelets were slightly low

## 2017-04-04 ENCOUNTER — Other Ambulatory Visit: Payer: Self-pay | Admitting: Family Medicine

## 2017-04-04 ENCOUNTER — Other Ambulatory Visit (INDEPENDENT_AMBULATORY_CARE_PROVIDER_SITE_OTHER): Payer: 59

## 2017-04-04 DIAGNOSIS — D696 Thrombocytopenia, unspecified: Secondary | ICD-10-CM | POA: Diagnosis not present

## 2017-04-04 LAB — CBC
HCT: 41.2 % (ref 36.0–46.0)
Hemoglobin: 13.6 g/dL (ref 12.0–15.0)
MCHC: 33.1 g/dL (ref 30.0–36.0)
MCV: 97.9 fl (ref 78.0–100.0)
Platelets: 139 10*3/uL — ABNORMAL LOW (ref 150.0–400.0)
RBC: 4.21 Mil/uL (ref 3.87–5.11)
RDW: 12.1 % (ref 11.5–15.5)
WBC: 5.2 10*3/uL (ref 4.0–10.5)

## 2017-04-04 NOTE — Telephone Encounter (Signed)
Order placed

## 2017-04-07 ENCOUNTER — Telehealth: Payer: Self-pay

## 2017-04-07 DIAGNOSIS — M1712 Unilateral primary osteoarthritis, left knee: Secondary | ICD-10-CM | POA: Diagnosis not present

## 2017-04-07 NOTE — Telephone Encounter (Signed)
Patient notified

## 2017-04-07 NOTE — Telephone Encounter (Signed)
Left message to return call to notify patient that she is scheduled with hematology 04/23/17 10:30

## 2017-04-14 ENCOUNTER — Telehealth: Payer: Self-pay

## 2017-04-14 NOTE — Telephone Encounter (Signed)
Noted  

## 2017-04-14 NOTE — Telephone Encounter (Signed)
fyi

## 2017-04-14 NOTE — Telephone Encounter (Signed)
Called patient to schedule her for pre-op appmt

## 2017-04-14 NOTE — Telephone Encounter (Signed)
Patient called back stating she will need to see her hematologist before scheduling her surgical clearance .

## 2017-04-22 ENCOUNTER — Other Ambulatory Visit: Payer: Self-pay | Admitting: Oncology

## 2017-04-23 ENCOUNTER — Inpatient Hospital Stay: Payer: 59

## 2017-04-23 ENCOUNTER — Encounter: Payer: Self-pay | Admitting: Oncology

## 2017-04-23 ENCOUNTER — Inpatient Hospital Stay: Payer: 59 | Attending: Oncology | Admitting: Oncology

## 2017-04-23 ENCOUNTER — Encounter: Payer: Self-pay | Admitting: *Deleted

## 2017-04-23 VITALS — BP 111/72 | HR 64 | Temp 97.7°F | Resp 16 | Ht 66.5 in | Wt 151.5 lb

## 2017-04-23 DIAGNOSIS — R238 Other skin changes: Secondary | ICD-10-CM

## 2017-04-23 DIAGNOSIS — R233 Spontaneous ecchymoses: Secondary | ICD-10-CM

## 2017-04-23 DIAGNOSIS — D696 Thrombocytopenia, unspecified: Secondary | ICD-10-CM | POA: Insufficient documentation

## 2017-04-23 DIAGNOSIS — R58 Hemorrhage, not elsewhere classified: Secondary | ICD-10-CM | POA: Diagnosis not present

## 2017-04-23 LAB — FOLATE: FOLATE: 35 ng/mL (ref >5.9)

## 2017-04-23 LAB — CBC WITH DIFFERENTIAL/PLATELET
Basophils Absolute: 0 10*3/uL (ref 0–0.1)
Basophils Relative: 1 %
EOS ABS: 0.1 10*3/uL (ref 0–0.7)
EOS PCT: 2 %
HCT: 39.4 % (ref 35.0–47.0)
Hemoglobin: 13.4 g/dL (ref 12.0–16.0)
LYMPHS ABS: 1.3 10*3/uL (ref 1.0–3.6)
Lymphocytes Relative: 27 %
MCH: 32.2 pg (ref 26.0–34.0)
MCHC: 34 g/dL (ref 32.0–36.0)
MCV: 94.6 fL (ref 80.0–100.0)
MONOS PCT: 6 %
Monocytes Absolute: 0.3 10*3/uL (ref 0.2–0.9)
Neutro Abs: 3.3 10*3/uL (ref 1.4–6.5)
Neutrophils Relative %: 64 %
PLATELETS: 159 10*3/uL (ref 150–440)
RBC: 4.17 MIL/uL (ref 3.80–5.20)
RDW: 12.5 % (ref 11.5–14.5)
WBC: 5.1 10*3/uL (ref 3.6–11.0)

## 2017-04-23 LAB — COMPREHENSIVE METABOLIC PANEL
ALK PHOS: 51 U/L (ref 38–126)
ALT: 13 U/L — ABNORMAL LOW (ref 14–54)
ANION GAP: 7 (ref 5–15)
AST: 20 U/L (ref 15–41)
Albumin: 4.2 g/dL (ref 3.5–5.0)
BUN: 20 mg/dL (ref 6–20)
CALCIUM: 8.8 mg/dL — AB (ref 8.9–10.3)
CHLORIDE: 105 mmol/L (ref 101–111)
CO2: 27 mmol/L (ref 22–32)
Creatinine, Ser: 0.63 mg/dL (ref 0.44–1.00)
Glucose, Bld: 95 mg/dL (ref 65–99)
Potassium: 4 mmol/L (ref 3.5–5.1)
SODIUM: 139 mmol/L (ref 135–145)
Total Bilirubin: 0.6 mg/dL (ref 0.3–1.2)
Total Protein: 6.8 g/dL (ref 6.5–8.1)

## 2017-04-23 LAB — VITAMIN B12: VITAMIN B 12: 444 pg/mL (ref 180–914)

## 2017-04-23 LAB — PLATELET FUNCTION ASSAY: Collagen / Epinephrine: 123 seconds (ref 0–193)

## 2017-04-23 LAB — TSH: TSH: 2.084 u[IU]/mL (ref 0.350–4.500)

## 2017-04-23 NOTE — Progress Notes (Signed)
Patient here today as a new patient  

## 2017-04-23 NOTE — Progress Notes (Signed)
Hematology/Oncology Consult note [facility] Telephone:([phone] [phone]  CONSULT NOTE Patient Care Team: Leone Haven, MD as PCP - General (Family Medicine)  Referring Provider:  Dr.Sonnenberg Randall Hiss  CHIEF COMPLAINTS/PURPOSE OF CONSULTATION:  I have low platelet and easy bruising.   HISTORY OF PRESENTING ILLNESS:  Rebecca Watkins 61 y.o.  female who was referred by PCP Dr.Sonnernberg for evaluation of thrombocytopenia. Patient has upcoming total knee arthroplasty and want to have work up for her low platelet. She also report easy bruising. She takes naproxen for knee pain. Recently since she was found to have low platelet she stopping taking Naproxen. She bruises easily, mostly on her extremities.  She works as Warden/ranger at [employer], currently on short term. Denies constitutional symptoms.   ROS:  Review of Systems  Constitutional: Negative.   HENT:  Negative.   Eyes: Negative.   Respiratory: Negative.   Cardiovascular: Negative.   Gastrointestinal: Negative.   Endocrine: Negative.   Genitourinary: Negative.    Musculoskeletal: Negative.   Skin: Negative.   Hematological: Bruises/bleeds easily.  Psychiatric/Behavioral: Negative.     MEDICAL HISTORY:  Past Medical History:  Diagnosis Date  . Diverticulitis   . PONV (postoperative nausea and vomiting)   . Temporary low platelet count (HCC)    H/O    SURGICAL HISTORY: Past Surgical History:  Procedure Laterality Date  . COLONOSCOPY WITH PROPOFOL N/A 12/15/2015   Procedure: COLONOSCOPY WITH PROPOFOL;  Surgeon: Manya Silvas, MD;  Location: [facility] ENDOSCOPY;  Service: Endoscopy;  Laterality: N/A;  . KNEE ARTHROSCOPY WITH MEDIAL MENISECTOMY Left 10/31/2016   Procedure: KNEE ARTHROSCOPY WITH MEDIAL MENISECTOMY;  Surgeon: Thornton Park, MD;  Location: [facility] ORS;  Service: Orthopedics;  Laterality: Left;  . TUBAL LIGATION      SOCIAL HISTORY: Social History   Social  History  . Marital status: Married    Spouse name: N/A  . Number of children: N/A  . Years of education: N/A   Occupational History  . Not on file.   Social History Main Topics  . Smoking status: Never Smoker  . Smokeless tobacco: Never Used  . Alcohol use No  . Drug use: No  . Sexual activity: Not on file   Other Topics Concern  . Not on file   Social History Narrative  . No narrative on file    FAMILY HISTORY: Family History  Problem Relation Age of Onset  . Breast cancer Mother 50  . Hyperlipidemia Mother   . Hypertension Mother   . Breast cancer Sister 76  . Breast cancer Maternal Aunt 70  . Hyperlipidemia Father   . Hypertension Father   . Diabetes Mellitus I Daughter     ALLERGIES:  is allergic to ciprofloxacin; erythromycin; and metronidazole.  MEDICATIONS:  Current Outpatient Prescriptions  Medication Sig Dispense Refill  . Multiple Vitamin (MULTI-VITAMINS) TABS Take 1 tablet by mouth daily.    . naproxen (EC NAPROSYN) 500 MG EC tablet Take 500 mg by mouth 2 (two) times daily.    Marland Kitchen PREMPRO 0.45-1.5 MG tablet Take 1 tablet by mouth daily.   3   No current facility-administered medications for this visit.       Marland Kitchen  PHYSICAL EXAMINATION: ECOG PERFORMANCE STATUS: 0 - Asymptomatic There were no vitals filed for this visit. There were no vitals filed for this visit.  GENERAL: Well-nourished well-developed; Alert, no distress and comfortable.  EYES: no pallor or icterus OROPHARYNX: no thrush or ulceration; good dentition  NECK: supple,  no masses felt LYMPH:  no palpable lymphadenopathy in the cervical, axillary or inguinal regions LUNGS: clear to auscultation and  No wheeze or crackles HEART/CVS: regular rate & rhythm and no murmurs; No lower extremity edema ABDOMEN: abdomen soft, non-tender and normal bowel sounds Musculoskeletal:no cyanosis of digits and no clubbing  PSYCH: alert & oriented x 3  NEURO: no focal motor/sensory deficits SKIN:  no  rashes or significant lesions  LABORATORY DATA:  I have reviewed the data as listed Lab Results  Component Value Date   WBC 5.2 04/04/2017   HGB 13.6 04/04/2017   HCT 41.2 04/04/2017   MCV 97.9 04/04/2017   PLT 139.0 (L) 04/04/2017    Recent Labs  10/25/16 1348  NA 141  K 4.2  CL 104  CO2 32  GLUCOSE 88  BUN 22  CREATININE 0.79  CALCIUM 9.2    RADIOGRAPHIC STUDIES: I have personally reviewed the radiological images as listed and agreed with the findings in the report. No recent images.    ASSESSMENT & PLAN:  1. Thrombocytopenia (HCC)   2. Easy bruising     Check cbc with differential, cmp, hepatitis panel, folate and B12 level, PFA, VWD panel, PT, PTT.   Assuming all these tests are normal, her mild thrombocytopenia does not pose her to high risk of bleeding. If needed, she can be on therapeutic anticoagulation for DVT prophylaxis post surgery.  All questions were answered. The patient knows to call the clinic with any problems questions or concerns.  Return of visit: 1 week.  Thank you for this kind referral and the opportunity to participate in the care of this patient. A copy of today's note is routed to referring provider    Earlie Server, MD, PhD Hematology Oncology [facility] Pager- [phone] 04/23/2017

## 2017-04-24 ENCOUNTER — Inpatient Hospital Stay: Payer: 59

## 2017-04-24 DIAGNOSIS — R58 Hemorrhage, not elsewhere classified: Secondary | ICD-10-CM | POA: Diagnosis not present

## 2017-04-24 DIAGNOSIS — D696 Thrombocytopenia, unspecified: Secondary | ICD-10-CM

## 2017-04-24 DIAGNOSIS — R233 Spontaneous ecchymoses: Secondary | ICD-10-CM

## 2017-04-24 DIAGNOSIS — R238 Other skin changes: Secondary | ICD-10-CM

## 2017-04-24 LAB — VON WILLEBRAND PANEL
Coagulation Factor VIII: 83 % (ref 57–163)
RISTOCETIN CO-FACTOR, PLASMA: 58 % (ref 50–200)
VON WILLEBRAND ANTIGEN, PLASMA: 85 % (ref 50–200)

## 2017-04-24 LAB — HEPATITIS PANEL, ACUTE
HCV AB: 0.1 {s_co_ratio} (ref 0.0–0.9)
HEP B S AG: NEGATIVE
Hep A IgM: NEGATIVE
Hep B C IgM: NEGATIVE

## 2017-04-24 LAB — COAG STUDIES INTERP REPORT

## 2017-04-24 LAB — APTT: aPTT: 29 seconds (ref 24–36)

## 2017-04-25 ENCOUNTER — Telehealth: Payer: Self-pay | Admitting: Family Medicine

## 2017-04-25 ENCOUNTER — Other Ambulatory Visit: Payer: Self-pay | Admitting: Oncology

## 2017-04-25 ENCOUNTER — Telehealth: Payer: Self-pay | Admitting: Oncology

## 2017-04-25 DIAGNOSIS — R233 Spontaneous ecchymoses: Secondary | ICD-10-CM

## 2017-04-25 DIAGNOSIS — R238 Other skin changes: Secondary | ICD-10-CM

## 2017-04-25 NOTE — Telephone Encounter (Signed)
Called patient and communicate with her about her lab results.  She has normal pt, ptt, normal platelet function and normal von willabrand levels. Platelet count is also adequate.  Given that she has easy bruising which is explained by the results, factor XIII congenital deficiency or acquired deficiency need to be ruled out. I suggest obtain factor XIII assay to be tested. This is a send out test and may take a while to come back.  Patient has concerns that she has been on disability and want to proceed with surgery and defer getting this test done. She understands the risk of bleeding with not fully understand the etiology of easy bruising.  Will send surgery clearance form for her.

## 2017-04-25 NOTE — Telephone Encounter (Signed)
Pt has have a clearance for surgery appt. Pt was offered 09/28 pt states she cannot wait that long and would like for Dr Caryl Bis to call her. Please advise?  Thank you!

## 2017-04-25 NOTE — Telephone Encounter (Signed)
Please advise 

## 2017-04-28 ENCOUNTER — Other Ambulatory Visit: Payer: Self-pay | Admitting: Oncology

## 2017-04-28 NOTE — Telephone Encounter (Signed)
Pt states that she cannot have a surgery date until she she has the clearance. Pt states that she needs the surgery sooner than the appt offered on 9/28. Please advise, thank you!  Call pt @ [phone]

## 2017-04-28 NOTE — Telephone Encounter (Signed)
Patient is scheduled for clearance

## 2017-04-30 ENCOUNTER — Encounter: Payer: Self-pay | Admitting: Family Medicine

## 2017-04-30 ENCOUNTER — Inpatient Hospital Stay: Payer: 59 | Admitting: Oncology

## 2017-04-30 ENCOUNTER — Ambulatory Visit (INDEPENDENT_AMBULATORY_CARE_PROVIDER_SITE_OTHER): Payer: 59 | Admitting: Family Medicine

## 2017-04-30 VITALS — BP 124/74 | HR 66 | Temp 98.3°F | Wt 152.1 lb

## 2017-04-30 DIAGNOSIS — Z01818 Encounter for other preprocedural examination: Secondary | ICD-10-CM

## 2017-04-30 LAB — POCT URINALYSIS DIPSTICK
Bilirubin, UA: NEGATIVE
Blood, UA: NEGATIVE
GLUCOSE UA: NEGATIVE
Ketones, UA: NEGATIVE
NITRITE UA: NEGATIVE
PROTEIN UA: NEGATIVE
UROBILINOGEN UA: 0.2 U/dL
pH, UA: 5.5 (ref 5.0–8.0)

## 2017-04-30 LAB — HEMOGLOBIN A1C: Hgb A1c MFr Bld: 5.1 % (ref 4.6–6.5)

## 2017-04-30 NOTE — Addendum Note (Signed)
Addended by: Arby Barrette on: 04/30/2017 03:45 PM   Modules accepted: Orders

## 2017-04-30 NOTE — Patient Instructions (Signed)
Nice to see you. Your form will be faxed to your surgeon.

## 2017-04-30 NOTE — Progress Notes (Signed)
  Tommi Rumps, MD Phone: [phone]  Rebecca Watkins is a 61 y.o. female who presents today for follow-up.  Patient presents for preoperative evaluation in preparation for left total knee replacement. She notes no chest pain or breathlessness when climbing up 2 flights of stairs. She has no history of kidney disease. She has no history of anesthesia issues or family history of anesthesia issues. She has never had a heart attack. She's never had a stroke. No history of seizures. No history of thyroid disease. No history of liver disease. No history of angina. No history of heart failure. No history of asthma or bronchitis. No history of diabetes. She does have a history of low platelets and she has been followed by hematology for that.  PMH: nonsmoker.   ROS see history of present illness  Objective  Physical Exam Vitals:   04/30/17 1336  BP: 124/74  Pulse: 66  Temp: 98.3 F (36.8 C)  SpO2: 97%    BP Readings from Last 3 Encounters:  04/30/17 124/74  04/23/17 111/72  03/25/17 130/72   Wt Readings from Last 3 Encounters:  04/30/17 152 lb 2 oz (69 kg)  04/23/17 151 lb 8 oz (68.7 kg)  03/25/17 147 lb 12.8 oz (67 kg)    Physical Exam  Constitutional: No distress.  HENT:  Head: Normocephalic and atraumatic.  Mouth/Throat: Oropharynx is clear and moist. No oropharyngeal exudate.  Eyes: Pupils are equal, round, and reactive to light. Conjunctivae are normal.  Cardiovascular: Normal rate, regular rhythm and normal heart sounds.   Pulmonary/Chest: Effort normal and breath sounds normal.  Abdominal: Soft. Bowel sounds are normal. She exhibits no distension. There is no tenderness. There is no rebound and no guarding.  Musculoskeletal: She exhibits no edema.  Neurological: She is alert. Gait normal.  Skin: Skin is warm and dry. She is not diaphoretic.   EKG: Normal sinus rhythm, rate 63, no ischemic changes noted  Assessment/Plan: Please see individual problem  list.  Pre-op evaluation Patient presents for preoperative evaluation for left total knee replacement. Overall she is doing well. No chest pain or breathlessness. Lyndel Safe perioperative risk score 0.02% placing her in the low risk category. She is at low risk for medical complications per NSQIP. She has had low platelets previously and clearance to come through hematology for that. Note and lab work will be faxed to her surgeon's office.   Orders Placed This Encounter  Procedures  . HgB A1c  . POCT Urinalysis Dipstick  . EKG 12-Lead   Tommi Rumps, MD [place]

## 2017-04-30 NOTE — Assessment & Plan Note (Signed)
Patient presents for preoperative evaluation for left total knee replacement. Overall she is doing well. No chest pain or breathlessness. Rebecca Watkins perioperative risk score 0.02% placing her in the low risk category. She is at low risk for medical complications per NSQIP. She has had low platelets previously and clearance to come through hematology for that. Note and lab work will be faxed to her surgeon's office.

## 2017-05-01 ENCOUNTER — Ambulatory Visit: Payer: Self-pay | Admitting: Family Medicine

## 2017-05-01 ENCOUNTER — Telehealth: Payer: Self-pay | Admitting: Family Medicine

## 2017-05-01 LAB — URINALYSIS, MICROSCOPIC ONLY

## 2017-05-01 NOTE — Telephone Encounter (Signed)
Sherry [phone] called from [facility] regarding to follow up on clearance form was faxed on 04/07/2017 for left total knee replacement. Judeen Hammans will refax again. Fax number [phone] Thank you!

## 2017-05-01 NOTE — Telephone Encounter (Signed)
Completed.

## 2017-05-01 NOTE — Telephone Encounter (Signed)
faxed

## 2017-05-01 NOTE — Telephone Encounter (Signed)
Please advise 

## 2017-05-02 LAB — URINE CULTURE
MICRO NUMBER: 81006602
SPECIMEN QUALITY:: ADEQUATE

## 2017-05-05 ENCOUNTER — Other Ambulatory Visit: Payer: Self-pay | Admitting: Orthopedic Surgery

## 2017-05-06 DIAGNOSIS — M1712 Unilateral primary osteoarthritis, left knee: Secondary | ICD-10-CM | POA: Diagnosis not present

## 2017-05-21 ENCOUNTER — Other Ambulatory Visit: Payer: Self-pay | Admitting: Obstetrics and Gynecology

## 2017-05-21 ENCOUNTER — Encounter
Admission: RE | Admit: 2017-05-21 | Discharge: 2017-05-21 | Disposition: A | Discharge status: Home / Self Care | Payer: 59 | Source: Ambulatory Visit | Attending: Orthopedic Surgery | Admitting: Orthopedic Surgery

## 2017-05-21 ENCOUNTER — Ambulatory Visit
Admission: RE | Admit: 2017-05-21 | Discharge: 2017-05-21 | Disposition: A | Discharge status: Home / Self Care | Payer: 59 | Source: Ambulatory Visit | Attending: Orthopedic Surgery | Admitting: Orthopedic Surgery

## 2017-05-21 DIAGNOSIS — Z01818 Encounter for other preprocedural examination: Secondary | ICD-10-CM | POA: Insufficient documentation

## 2017-05-21 DIAGNOSIS — Z9189 Other specified personal risk factors, not elsewhere classified: Secondary | ICD-10-CM | POA: Diagnosis not present

## 2017-05-21 DIAGNOSIS — Z96659 Presence of unspecified artificial knee joint: Secondary | ICD-10-CM | POA: Diagnosis not present

## 2017-05-21 DIAGNOSIS — N841 Polyp of cervix uteri: Secondary | ICD-10-CM | POA: Diagnosis not present

## 2017-05-21 DIAGNOSIS — Z124 Encounter for screening for malignant neoplasm of cervix: Secondary | ICD-10-CM | POA: Diagnosis not present

## 2017-05-21 DIAGNOSIS — Z01811 Encounter for preprocedural respiratory examination: Secondary | ICD-10-CM

## 2017-05-21 DIAGNOSIS — Z471 Aftercare following joint replacement surgery: Secondary | ICD-10-CM | POA: Diagnosis not present

## 2017-05-21 DIAGNOSIS — N6002 Solitary cyst of left breast: Secondary | ICD-10-CM | POA: Diagnosis not present

## 2017-05-21 DIAGNOSIS — N6452 Nipple discharge: Secondary | ICD-10-CM | POA: Diagnosis not present

## 2017-05-21 DIAGNOSIS — Z1211 Encounter for screening for malignant neoplasm of colon: Secondary | ICD-10-CM | POA: Diagnosis not present

## 2017-05-21 DIAGNOSIS — Z01411 Encounter for gynecological examination (general) (routine) with abnormal findings: Secondary | ICD-10-CM | POA: Diagnosis not present

## 2017-05-21 HISTORY — DX: Unspecified osteoarthritis, unspecified site: M19.90

## 2017-05-21 LAB — URINALYSIS, COMPLETE (UACMP) WITH MICROSCOPIC
Bacteria, UA: NONE SEEN
Bilirubin Urine: NEGATIVE
GLUCOSE, UA: NEGATIVE mg/dL
HGB URINE DIPSTICK: NEGATIVE
Ketones, ur: 5 mg/dL — AB
NITRITE: NEGATIVE
PH: 6 (ref 5.0–8.0)
Protein, ur: NEGATIVE mg/dL
SPECIFIC GRAVITY, URINE: 1.013 (ref 1.005–1.030)

## 2017-05-21 LAB — TYPE AND SCREEN
ABO/RH(D): B POS
Antibody Screen: NEGATIVE

## 2017-05-21 LAB — SURGICAL PCR SCREEN
MRSA, PCR: NEGATIVE
Staphylococcus aureus: NEGATIVE

## 2017-05-21 LAB — PROTIME-INR
INR: 0.99
Prothrombin Time: 13 seconds (ref 11.4–15.2)

## 2017-05-21 LAB — APTT: aPTT: 31 seconds (ref 24–36)

## 2017-05-21 NOTE — Patient Instructions (Signed)
Your procedure is scheduled on: May 29, 2017 (THURSDAY ) Report to Same Day Surgery 2nd floor medical mall ([place] Entrance-take elevator on left to 2nd floor.  Check in with surgery information desk.) To find out your arrival time please call [phone] between 1PM - 3PM on May 28, 2017 [facility] )   Remember: Instructions that are not followed completely may result in serious medical risk, up to and including death, or upon the discretion of your surgeon and anesthesiologist your surgery may need to be rescheduled.    _x___ 1. Do not eat food after midnight the night before your procedure. You may drink clear liquids up to 2 hours before you are scheduled to arrive at the hospital for your procedure.  Do not drink clear liquids within 2 hours of your scheduled arrival to the hospital.  Clear liquids include  --Water or Apple juice without pulp  --Clear carbohydrate beverage such as ClearFast or Gatorade  --Black Coffee or Clear Tea (No milk, no creamers, do not add anything to                  the coffee or Tea Type 1 and type 2 diabetics should only drink water.  No gum chewing or hard candies.     __x__ 2. No Alcohol for 24 hours before or after surgery.   __x__3. No Smoking for 24 prior to surgery.   ____  4. Bring all medications with you on the day of surgery if instructed.    __x__ 5. Notify your doctor if there is any change in your medical condition     (cold, fever, infections).     Do not wear jewelry, make-up, hairpins, clips or nail polish.  Do not wear lotions, powders, or perfumes.  Do not shave 48 hours prior to surgery. Men may shave face and neck.  Do not bring valuables to the hospital.    [facility] is not responsible for any belongings or valuables.               Contacts, dentures or bridgework may not be worn into surgery.  Leave your suitcase in the car. After surgery it may be brought to your room.  For patients admitted to the hospital,  discharge time is determined by your treatment team                     Patients discharged the day of surgery will not be allowed to drive home.  You will need someone to drive you home and stay with you the night of your procedure.    Please read over the following fact sheets that you were given:   [facility] Preparing for Surgery and or MRSA Information   __ Take anti-hypertensive listed below, cardiac, seizure, asthma,     anti-reflux and psychiatric medicines. These include:  1.   2.  3.  4.  5.  6.  ____Fleets enema or Magnesium Citrate as directed.   _x___ Use CHG Soap or sage wipes as directed on instruction sheet   ____ Use inhalers on the day of surgery and bring to hospital day of surgery  ____ Stop Metformin and Janumet 2 days prior to surgery.    ____ Take 1/2 of usual insulin dose the night before surgery and none on the morning surgery       _x___ Follow recommendations from Cardiologist, Pulmonologist or PCP regarding  stopping Aspirin, Coumadin, Plavix ,Eliquis, Effient, or Pradaxa, and Pletal.  X____Stop Anti-inflammatories such as Advil, Aleve, Ibuprofen, Motrin, Naproxen, Naprosyn, Goodies powders or aspirin products. OK to take Tylenol                             _x___ Stop supplements until after surgery.  But may continue Vitamin D, Vitamin B, and multivitamin         ____ Bring C-Pap to the hospital.

## 2017-05-28 ENCOUNTER — Ambulatory Visit
Admission: RE | Admit: 2017-05-28 | Discharge: 2017-05-28 | Disposition: A | Discharge status: Home / Self Care | Payer: 59 | Source: Ambulatory Visit | Attending: Obstetrics and Gynecology | Admitting: Obstetrics and Gynecology

## 2017-05-28 DIAGNOSIS — N6452 Nipple discharge: Secondary | ICD-10-CM | POA: Insufficient documentation

## 2017-05-28 DIAGNOSIS — N6489 Other specified disorders of breast: Secondary | ICD-10-CM | POA: Diagnosis not present

## 2017-05-28 DIAGNOSIS — R928 Other abnormal and inconclusive findings on diagnostic imaging of breast: Secondary | ICD-10-CM | POA: Diagnosis not present

## 2017-05-28 DIAGNOSIS — N6002 Solitary cyst of left breast: Secondary | ICD-10-CM | POA: Insufficient documentation

## 2017-06-03 ENCOUNTER — Ambulatory Visit: Payer: 59

## 2017-06-05 ENCOUNTER — Ambulatory Visit: Payer: 59

## 2017-06-10 ENCOUNTER — Ambulatory Visit: Payer: 59

## 2017-06-12 ENCOUNTER — Ambulatory Visit: Payer: 59

## 2017-06-12 ENCOUNTER — Other Ambulatory Visit: Payer: Self-pay | Admitting: *Deleted

## 2017-06-12 MED ORDER — CEFAZOLIN SODIUM-DEXTROSE 2-4 GM/100ML-% IV SOLN
2.0000 g | INTRAVENOUS | Status: AC
  Administered 2017-06-13: 2 g via INTRAVENOUS

## 2017-06-12 MED ORDER — CLINDAMYCIN PHOSPHATE 600 MG/50ML IV SOLN
600.0000 mg | Freq: Once | INTRAVENOUS | Status: AC
  Administered 2017-06-13: 600 mg via INTRAVENOUS

## 2017-06-12 NOTE — Patient Outreach (Signed)
[facility]) Care Management  06/12/2017  Aybree Lanyon 03-14-1956 [id number]   Subjective: Telephone call to patient's home number, no answer, left HIPAA compliant voicemail message, and requested call back.    Objective: Per KPN (Knowledge Performance Now, point of care tool) and chart review, patient to be admitted on 06/13/17 for Left TOTAL KNEE ARTHROPLASTY.       Assessment: Received UMR Preoperative / Transition of care referral on 05/30/17.  Preoperative call and transition of care follow up pending patient contact.     Plan: RNCM will call patient for 2nd telephone outreach attempt, preoperative call follow up and / or transition of care follow up, within 10 business days if no return call.    Shelisa Fern H. Annia Friendly, BSN, [facility] Telephonic CM Phone: [phone] Fax: [phone]

## 2017-06-13 ENCOUNTER — Encounter
Admission: RE | Disposition: A | Discharge status: Home / Self Care | Payer: Self-pay | Source: Ambulatory Visit | Attending: Orthopedic Surgery

## 2017-06-13 ENCOUNTER — Inpatient Hospital Stay: Payer: 59 | Admitting: Anesthesiology

## 2017-06-13 ENCOUNTER — Inpatient Hospital Stay: Payer: 59

## 2017-06-13 ENCOUNTER — Inpatient Hospital Stay
Admission: RE | Admit: 2017-06-13 | Discharge: 2017-06-16 | DRG: 470 | Disposition: A | Discharge status: Home / Self Care | Payer: 59 | Source: Ambulatory Visit | Attending: Orthopedic Surgery | Admitting: Orthopedic Surgery

## 2017-06-13 ENCOUNTER — Encounter: Payer: Self-pay | Admitting: *Deleted

## 2017-06-13 ENCOUNTER — Other Ambulatory Visit: Payer: Self-pay | Admitting: *Deleted

## 2017-06-13 DIAGNOSIS — Z96652 Presence of left artificial knee joint: Secondary | ICD-10-CM | POA: Diagnosis not present

## 2017-06-13 DIAGNOSIS — Z79890 Hormone replacement therapy (postmenopausal): Secondary | ICD-10-CM | POA: Diagnosis not present

## 2017-06-13 DIAGNOSIS — Z791 Long term (current) use of non-steroidal anti-inflammatories (NSAID): Secondary | ICD-10-CM | POA: Diagnosis not present

## 2017-06-13 DIAGNOSIS — Z881 Allergy status to other antibiotic agents status: Secondary | ICD-10-CM | POA: Diagnosis not present

## 2017-06-13 DIAGNOSIS — Z9189 Other specified personal risk factors, not elsewhere classified: Secondary | ICD-10-CM

## 2017-06-13 DIAGNOSIS — Z885 Allergy status to narcotic agent status: Secondary | ICD-10-CM | POA: Diagnosis not present

## 2017-06-13 DIAGNOSIS — Z471 Aftercare following joint replacement surgery: Secondary | ICD-10-CM | POA: Diagnosis not present

## 2017-06-13 DIAGNOSIS — T402X5A Adverse effect of other opioids, initial encounter: Secondary | ICD-10-CM | POA: Diagnosis not present

## 2017-06-13 DIAGNOSIS — Y92230 Patient room in hospital as the place of occurrence of the external cause: Secondary | ICD-10-CM | POA: Diagnosis not present

## 2017-06-13 DIAGNOSIS — M1712 Unilateral primary osteoarthritis, left knee: Principal | ICD-10-CM | POA: Diagnosis present

## 2017-06-13 DIAGNOSIS — R110 Nausea: Secondary | ICD-10-CM | POA: Diagnosis not present

## 2017-06-13 HISTORY — PX: TOTAL KNEE ARTHROPLASTY: SHX125

## 2017-06-13 LAB — TYPE AND SCREEN
ABO/RH(D): B POS
Antibody Screen: NEGATIVE

## 2017-06-13 SURGERY — ARTHROPLASTY, KNEE, TOTAL
Anesthesia: Spinal | Site: Knee | Laterality: Left | Wound class: Clean

## 2017-06-13 MED ORDER — MAGNESIUM CITRATE PO SOLN
1.0000 | Freq: Once | ORAL | Status: DC | PRN
  Filled 2017-06-13: qty 296

## 2017-06-13 MED ORDER — MIDAZOLAM HCL 2 MG/2ML IJ SOLN
INTRAMUSCULAR | Status: AC
  Filled 2017-06-13: qty 2

## 2017-06-13 MED ORDER — FENTANYL CITRATE (PF) 100 MCG/2ML IJ SOLN
INTRAMUSCULAR | Status: AC
  Filled 2017-06-13: qty 2

## 2017-06-13 MED ORDER — OXYCODONE HCL 5 MG PO TABS
5.0000 mg | ORAL_TABLET | ORAL | Status: DC | PRN
  Administered 2017-06-13: 5 mg via ORAL
  Filled 2017-06-13: qty 1

## 2017-06-13 MED ORDER — ONDANSETRON HCL 4 MG/2ML IJ SOLN
4.0000 mg | Freq: Once | INTRAMUSCULAR | Status: DC | PRN
Start: 2017-06-13 — End: 2017-06-13

## 2017-06-13 MED ORDER — FENTANYL CITRATE (PF) 100 MCG/2ML IJ SOLN: 25.0000 ug | INTRAMUSCULAR | Status: DC | PRN

## 2017-06-13 MED ORDER — DOCUSATE SODIUM 100 MG PO CAPS
100.0000 mg | ORAL_CAPSULE | Freq: Two times a day (BID) | ORAL | Status: DC
  Administered 2017-06-13 – 2017-06-16 (×6): 100 mg via ORAL
  Filled 2017-06-13 (×6): qty 1

## 2017-06-13 MED ORDER — CHLORHEXIDINE GLUCONATE CLOTH 2 % EX PADS: 6.0000 | MEDICATED_PAD | Freq: Once | CUTANEOUS | Status: DC

## 2017-06-13 MED ORDER — HYDROCODONE-ACETAMINOPHEN 5-325 MG PO TABS: 2.0000 | ORAL_TABLET | ORAL | Status: DC | PRN

## 2017-06-13 MED ORDER — KETOROLAC TROMETHAMINE 30 MG/ML IJ SOLN
INTRAMUSCULAR | Status: AC
  Filled 2017-06-13: qty 1

## 2017-06-13 MED ORDER — NEOMYCIN-POLYMYXIN B GU 40-200000 IR SOLN
Status: AC
  Filled 2017-06-13: qty 20

## 2017-06-13 MED ORDER — METHOCARBAMOL 500 MG PO TABS: 500.0000 mg | ORAL_TABLET | Freq: Four times a day (QID) | ORAL | Status: DC | PRN

## 2017-06-13 MED ORDER — SODIUM CHLORIDE 0.9 % IV SOLN
INTRAVENOUS | Status: DC
  Administered 2017-06-13 – 2017-06-14 (×2): via INTRAVENOUS

## 2017-06-13 MED ORDER — BUPIVACAINE-EPINEPHRINE (PF) 0.25% -1:200000 IJ SOLN
INTRAMUSCULAR | Status: AC
  Filled 2017-06-13: qty 30

## 2017-06-13 MED ORDER — SODIUM CHLORIDE FLUSH 0.9 % IV SOLN
INTRAVENOUS | Status: AC
  Filled 2017-06-13: qty 10

## 2017-06-13 MED ORDER — ACETAMINOPHEN 650 MG RE SUPP: 650.0000 mg | RECTAL | Status: DC | PRN

## 2017-06-13 MED ORDER — BUPIVACAINE HCL (PF) 0.5 % IJ SOLN
INTRAMUSCULAR | Status: AC
  Filled 2017-06-13: qty 10

## 2017-06-13 MED ORDER — SENNOSIDES-DOCUSATE SODIUM 8.6-50 MG PO TABS: 1.0000 | ORAL_TABLET | Freq: Every evening | ORAL | Status: DC | PRN

## 2017-06-13 MED ORDER — CELECOXIB 200 MG PO CAPS
200.0000 mg | ORAL_CAPSULE | Freq: Two times a day (BID) | ORAL | Status: DC
  Administered 2017-06-13 – 2017-06-16 (×5): 200 mg via ORAL
  Filled 2017-06-13 (×6): qty 1

## 2017-06-13 MED ORDER — HYDROCODONE-ACETAMINOPHEN 5-325 MG PO TABS
1.0000 | ORAL_TABLET | ORAL | Status: DC | PRN
  Administered 2017-06-14: 1 via ORAL
  Filled 2017-06-13 (×2): qty 1

## 2017-06-13 MED ORDER — LIDOCAINE HCL (PF) 2 % IJ SOLN
INTRAMUSCULAR | Status: AC
  Filled 2017-06-13: qty 10

## 2017-06-13 MED ORDER — MORPHINE SULFATE (PF) 4 MG/ML IV SOLN
INTRAVENOUS | Status: AC
  Filled 2017-06-13: qty 1

## 2017-06-13 MED ORDER — LACTATED RINGERS IV SOLN
INTRAVENOUS | Status: DC
  Administered 2017-06-13 (×3): via INTRAVENOUS

## 2017-06-13 MED ORDER — ONDANSETRON HCL 4 MG/2ML IJ SOLN
INTRAMUSCULAR | Status: AC
  Filled 2017-06-13: qty 2

## 2017-06-13 MED ORDER — MIDAZOLAM HCL 5 MG/5ML IJ SOLN
INTRAMUSCULAR | Status: DC | PRN
Start: 2017-06-13 — End: 2017-06-13
  Administered 2017-06-13: 2 mg via INTRAVENOUS

## 2017-06-13 MED ORDER — SODIUM CHLORIDE 0.9 % IV SOLN
INTRAVENOUS | Status: DC | PRN
  Administered 2017-06-13: 120 mL

## 2017-06-13 MED ORDER — PROPOFOL 500 MG/50ML IV EMUL
INTRAVENOUS | Status: AC
  Filled 2017-06-13: qty 50

## 2017-06-13 MED ORDER — FAMOTIDINE 20 MG PO TABS
ORAL_TABLET | ORAL | Status: AC
  Administered 2017-06-13: 20 mg via ORAL
  Filled 2017-06-13: qty 1

## 2017-06-13 MED ORDER — CEFAZOLIN SODIUM-DEXTROSE 1-4 GM/50ML-% IV SOLN
1.0000 g | Freq: Four times a day (QID) | INTRAVENOUS | Status: AC
  Administered 2017-06-13 – 2017-06-14 (×2): 1 g via INTRAVENOUS
  Filled 2017-06-13 (×2): qty 50

## 2017-06-13 MED ORDER — DEXAMETHASONE SODIUM PHOSPHATE 10 MG/ML IJ SOLN
INTRAMUSCULAR | Status: AC
  Filled 2017-06-13: qty 1

## 2017-06-13 MED ORDER — OXYCODONE HCL 5 MG PO TABS: 10.0000 mg | ORAL_TABLET | ORAL | Status: DC | PRN

## 2017-06-13 MED ORDER — SODIUM CHLORIDE 0.9 % IV SOLN
INTRAVENOUS | Status: DC | PRN
  Administered 2017-06-13: 30 ug/min via INTRAVENOUS

## 2017-06-13 MED ORDER — NEOMYCIN-POLYMYXIN B GU 40-200000 IR SOLN
Status: DC | PRN
  Administered 2017-06-13: 16 mL

## 2017-06-13 MED ORDER — MORPHINE SULFATE 4 MG/ML IJ SOLN
INTRAMUSCULAR | Status: DC | PRN
  Administered 2017-06-13: 62 mL via INTRA_ARTICULAR

## 2017-06-13 MED ORDER — BUPIVACAINE LIPOSOME 1.3 % IJ SUSP
INTRAMUSCULAR | Status: AC
  Filled 2017-06-13: qty 20

## 2017-06-13 MED ORDER — FENTANYL CITRATE (PF) 100 MCG/2ML IJ SOLN
INTRAMUSCULAR | Status: DC | PRN
  Administered 2017-06-13: 50 ug via INTRAVENOUS

## 2017-06-13 MED ORDER — BISACODYL 5 MG PO TBEC
5.0000 mg | DELAYED_RELEASE_TABLET | Freq: Every day | ORAL | Status: DC | PRN
  Administered 2017-06-15: 5 mg via ORAL
  Filled 2017-06-13: qty 1

## 2017-06-13 MED ORDER — PROPOFOL 10 MG/ML IV BOLUS
INTRAVENOUS | Status: DC | PRN
  Administered 2017-06-13: 40 mg via INTRAVENOUS

## 2017-06-13 MED ORDER — PROPOFOL 500 MG/50ML IV EMUL
INTRAVENOUS | Status: DC | PRN
  Administered 2017-06-13: 100 ug/kg/min via INTRAVENOUS

## 2017-06-13 MED ORDER — ONDANSETRON HCL 4 MG/2ML IJ SOLN
INTRAMUSCULAR | Status: DC | PRN
  Administered 2017-06-13: 4 mg via INTRAVENOUS

## 2017-06-13 MED ORDER — GABAPENTIN 300 MG PO CAPS
300.0000 mg | ORAL_CAPSULE | Freq: Three times a day (TID) | ORAL | Status: DC
  Administered 2017-06-13 – 2017-06-16 (×6): 300 mg via ORAL
  Filled 2017-06-13 (×8): qty 1

## 2017-06-13 MED ORDER — MORPHINE SULFATE (PF) 2 MG/ML IV SOLN
2.0000 mg | INTRAVENOUS | Status: DC | PRN
  Administered 2017-06-14 – 2017-06-15 (×2): 2 mg via INTRAVENOUS
  Filled 2017-06-13 (×2): qty 1

## 2017-06-13 MED ORDER — ONDANSETRON HCL 4 MG PO TABS: 4.0000 mg | ORAL_TABLET | Freq: Four times a day (QID) | ORAL | Status: DC | PRN

## 2017-06-13 MED ORDER — PHENYLEPHRINE HCL 10 MG/ML IJ SOLN
INTRAMUSCULAR | Status: DC | PRN
  Administered 2017-06-13: 100 ug via INTRAVENOUS
  Administered 2017-06-13: 150 ug via INTRAVENOUS

## 2017-06-13 MED ORDER — BUPIVACAINE-EPINEPHRINE (PF) 0.25% -1:200000 IJ SOLN
INTRAMUSCULAR | Status: AC
Start: 2017-06-13 — End: ?
  Filled 2017-06-13: qty 30

## 2017-06-13 MED ORDER — CLINDAMYCIN PHOSPHATE 600 MG/50ML IV SOLN
INTRAVENOUS | Status: AC
  Filled 2017-06-13: qty 50

## 2017-06-13 MED ORDER — ENOXAPARIN SODIUM 30 MG/0.3ML ~~LOC~~ SOLN
30.0000 mg | Freq: Two times a day (BID) | SUBCUTANEOUS | Status: DC
  Administered 2017-06-14 – 2017-06-16 (×5): 30 mg via SUBCUTANEOUS
  Filled 2017-06-13 (×5): qty 0.3

## 2017-06-13 MED ORDER — PHENYLEPHRINE HCL 10 MG/ML IJ SOLN
INTRAMUSCULAR | Status: AC
  Filled 2017-06-13: qty 1

## 2017-06-13 MED ORDER — FAMOTIDINE 20 MG PO TABS
20.0000 mg | ORAL_TABLET | Freq: Once | ORAL | Status: AC
  Administered 2017-06-13: 20 mg via ORAL

## 2017-06-13 MED ORDER — LIDOCAINE HCL (CARDIAC) 20 MG/ML IV SOLN
INTRAVENOUS | Status: DC | PRN
Start: 2017-06-13 — End: 2017-06-13
  Administered 2017-06-13: 60 mg via INTRAVENOUS

## 2017-06-13 MED ORDER — CEFAZOLIN SODIUM-DEXTROSE 2-4 GM/100ML-% IV SOLN
INTRAVENOUS | Status: AC
  Filled 2017-06-13: qty 100

## 2017-06-13 MED ORDER — DIPHENHYDRAMINE HCL 12.5 MG/5ML PO ELIX: 12.5000 mg | ORAL_SOLUTION | ORAL | Status: DC | PRN

## 2017-06-13 MED ORDER — METHOCARBAMOL 1000 MG/10ML IJ SOLN
500.0000 mg | Freq: Four times a day (QID) | INTRAVENOUS | Status: DC | PRN
  Filled 2017-06-13: qty 5

## 2017-06-13 MED ORDER — ONDANSETRON HCL 4 MG/2ML IJ SOLN
4.0000 mg | Freq: Four times a day (QID) | INTRAMUSCULAR | Status: DC | PRN
  Administered 2017-06-13 – 2017-06-14 (×3): 4 mg via INTRAVENOUS
  Filled 2017-06-13 (×3): qty 2

## 2017-06-13 MED ORDER — DEXAMETHASONE SODIUM PHOSPHATE 10 MG/ML IJ SOLN
INTRAMUSCULAR | Status: DC | PRN
  Administered 2017-06-13: 10 mg via INTRAVENOUS

## 2017-06-13 MED ORDER — ACETAMINOPHEN 325 MG PO TABS
650.0000 mg | ORAL_TABLET | ORAL | Status: DC | PRN
  Administered 2017-06-13 – 2017-06-14 (×3): 650 mg via ORAL
  Filled 2017-06-13 (×3): qty 2

## 2017-06-13 SURGICAL SUPPLY — 70 items
AUTOTRANSFUS HAS 1/8 (MISCELLANEOUS)
BAG DECANTER FOR FLEXI CONT (MISCELLANEOUS) ×2 IMPLANT
BLADE SAW 1 (BLADE) ×2 IMPLANT
BLADE SAW 1/2 (BLADE) ×2 IMPLANT
CANISTER SUCT 1200ML W/VALVE (MISCELLANEOUS) ×2 IMPLANT
CANISTER SUCT 3000ML (MISCELLANEOUS) ×4 IMPLANT
CAP KNEE TOTAL 3 SIGMA ×2 IMPLANT
CATH TRAY METER 16FR LF (MISCELLANEOUS) ×2 IMPLANT
CEMENT HV SMART SET (Cement) ×4 IMPLANT
CNTNR SPEC 2.5X3XGRAD LEK (MISCELLANEOUS) ×1
CONT SPEC 4OZ STER OR WHT (MISCELLANEOUS) ×1
CONTAINER SPEC 2.5X3XGRAD LEK (MISCELLANEOUS) ×1 IMPLANT
COOLER POLAR GLACIER W/PUMP (MISCELLANEOUS) ×2 IMPLANT
CUFF TOURN 24 STER (MISCELLANEOUS) IMPLANT
CUFF TOURN 30 STER DUAL PORT (MISCELLANEOUS) ×2 IMPLANT
DRAPE IMP U-DRAPE 54X76 (DRAPES) ×2 IMPLANT
DRAPE INCISE IOBAN 66X60 STRL (DRAPES) ×2 IMPLANT
DRAPE SHEET LG 3/4 BI-LAMINATE (DRAPES) ×4 IMPLANT
DRAPE SURG 17X11 SM STRL (DRAPES) ×4 IMPLANT
DRSG OPSITE POSTOP 4X12 (GAUZE/BANDAGES/DRESSINGS) ×2 IMPLANT
DRSG OPSITE POSTOP 4X14 (GAUZE/BANDAGES/DRESSINGS) ×2 IMPLANT
DRSG TEGADERM 4X4.75 (GAUZE/BANDAGES/DRESSINGS) ×2 IMPLANT
DURAPREP 26ML APPLICATOR (WOUND CARE) ×6 IMPLANT
ELECT REM PT RETURN 9FT ADLT (ELECTROSURGICAL) ×2
ELECTRODE REM PT RTRN 9FT ADLT (ELECTROSURGICAL) ×1 IMPLANT
GAUZE PETRO XEROFOAM 1X8 (MISCELLANEOUS) ×2 IMPLANT
GAUZE SPONGE 4X4 12PLY STRL (GAUZE/BANDAGES/DRESSINGS) ×2 IMPLANT
GLOVE BIOGEL PI IND STRL 7.0 (GLOVE) ×6 IMPLANT
GLOVE BIOGEL PI IND STRL 9 (GLOVE) ×1 IMPLANT
GLOVE BIOGEL PI INDICATOR 7.0 (GLOVE) ×6
GLOVE BIOGEL PI INDICATOR 9 (GLOVE) ×1
GLOVE SURG 9.0 ORTHO LTXF (GLOVE) ×4 IMPLANT
GOWN STRL REUS TWL 2XL XL LVL4 (GOWN DISPOSABLE) ×2 IMPLANT
GOWN STRL REUS W/ TWL LRG LVL3 (GOWN DISPOSABLE) ×2 IMPLANT
GOWN STRL REUS W/ TWL LRG LVL4 (GOWN DISPOSABLE) ×1 IMPLANT
GOWN STRL REUS W/TWL LRG LVL3 (GOWN DISPOSABLE) ×2
GOWN STRL REUS W/TWL LRG LVL4 (GOWN DISPOSABLE) ×1
HOLDER FOLEY CATH W/STRAP (MISCELLANEOUS) ×2 IMPLANT
IMMBOLIZER KNEE 19 BLUE UNIV (SOFTGOODS) ×2 IMPLANT
IV NS 100ML SINGLE PACK (IV SOLUTION) ×2 IMPLANT
KIT RM TURNOVER STRD PROC AR (KITS) ×2 IMPLANT
NDL SAFETY 18GX1.5 (NEEDLE) ×2 IMPLANT
NDL SAFETY 22GX1.5 (NEEDLE) ×2 IMPLANT
NDL SAFETY ECLIPSE 18X1.5 (NEEDLE) ×1 IMPLANT
NEEDLE HYPO 18GX1.5 SHARP (NEEDLE) ×1
NEEDLE SPNL 20GX3.5 QUINCKE YW (NEEDLE) ×2 IMPLANT
NS IRRIG 1000ML POUR BTL (IV SOLUTION) ×2 IMPLANT
PACK TOTAL KNEE (MISCELLANEOUS) ×2 IMPLANT
PAD PREP 24X41 OB/GYN DISP (PERSONAL CARE ITEMS) ×2 IMPLANT
PAD WRAPON POLAR KNEE (MISCELLANEOUS) ×1 IMPLANT
PULSAVAC PLUS IRRIG FAN TIP (DISPOSABLE) ×2
SOL .9 NS 3000ML IRR  AL (IV SOLUTION) ×1
SOL .9 NS 3000ML IRR UROMATIC (IV SOLUTION) ×1 IMPLANT
SPONGE DRAIN TRACH 4X4 STRL 2S (GAUZE/BANDAGES/DRESSINGS) ×2 IMPLANT
SPONGE LAP 18X18 5 PK (GAUZE/BANDAGES/DRESSINGS) IMPLANT
STAPLER SKIN PROX 35W (STAPLE) ×2 IMPLANT
SUCTION FRAZIER HANDLE 10FR (MISCELLANEOUS) ×1
SUCTION TUBE FRAZIER 10FR DISP (MISCELLANEOUS) ×1 IMPLANT
SUT ETHIBOND NAB CT1 #1 30IN (SUTURE) ×4 IMPLANT
SUT VIC AB 0 CT1 36 (SUTURE) ×2 IMPLANT
SUT VIC AB 0 CT2 27 (SUTURE) ×2 IMPLANT
SUT VIC AB 2-0 CT1 (SUTURE) ×4 IMPLANT
SYR 20CC LL (SYRINGE) ×4 IMPLANT
SYR 30ML LL (SYRINGE) ×6 IMPLANT
SYR 3ML 18GX1 1/2 (SYRINGE) ×2 IMPLANT
SYSTEM AUTOTRANSFUS DUAL TROCR (MISCELLANEOUS) IMPLANT
TIP FAN IRRIG PULSAVAC PLUS (DISPOSABLE) ×1 IMPLANT
TOWER CARTRIDGE SMART MIX (DISPOSABLE) ×2 IMPLANT
TUBE SUCT KAM VAC (TUBING) ×2 IMPLANT
WRAPON POLAR PAD KNEE (MISCELLANEOUS) ×2

## 2017-06-13 NOTE — Progress Notes (Signed)
MD notified for change to pain medication due to patient nausea. New orders placed.

## 2017-06-13 NOTE — H&P (Signed)
PREOPERATIVE H&P  Chief Complaint: M17.12 Unilateral primary osteoarthritis, left knee  HPI: Rebecca Watkins is a 61 y.o. female who presents for preoperative history and physical with a diagnosis of M17.12 Unilateral primary osteoarthritis, left knee. Symptoms are rated as moderate to severe, and have been worsening.  This is significantly impairing activities of daily living.  She has elected for surgical management.   Past Medical History:  Diagnosis Date  . Arthritis    Osteoarthritis  . Breast discharge 04/2017   when expressed  . Diverticulitis 2017  . PONV (postoperative nausea and vomiting)   . Temporary low platelet count (HCC)    H/O   Past Surgical History:  Procedure Laterality Date  . COLONOSCOPY WITH PROPOFOL N/A 12/15/2015   Procedure: COLONOSCOPY WITH PROPOFOL;  Surgeon: Manya Silvas, MD;  Location: [facility] ENDOSCOPY;  Service: Endoscopy;  Laterality: N/A;  . KNEE ARTHROSCOPY WITH MEDIAL MENISECTOMY Left 10/31/2016   Procedure: KNEE ARTHROSCOPY WITH MEDIAL MENISECTOMY;  Surgeon: Thornton Park, MD;  Location: [facility] ORS;  Service: Orthopedics;  Laterality: Left;  . TUBAL LIGATION     Social History   Social History  . Marital status: Married    Spouse name: N/A  . Number of children: N/A  . Years of education: N/A   Social History Main Topics  . Smoking status: Never Smoker  . Smokeless tobacco: Never Used  . Alcohol use No  . Drug use: No  . Sexual activity: Not Asked   Other Topics Concern  . None   Social History Narrative  . None   Family History  Problem Relation Age of Onset  . Hyperlipidemia Mother   . Hypertension Mother   . Breast cancer Sister 47  . Breast cancer Maternal Aunt 70  . Hyperlipidemia Father   . Hypertension Father   . Esophageal cancer Father   . Diabetes Mellitus I Daughter   . Breast cancer Daughter 64   Allergies  Allergen Reactions  . Dilaudid [Hydromorphone Hcl] Nausea And Vomiting  . Ciprofloxacin Rash  .  Erythromycin Rash and Other (See Comments)    Has patient had a PCN reaction causing immediate rash, facial/tongue/throat swelling, SOB or lightheadedness with hypotension: Yes Has patient had a PCN reaction causing severe rash involving mucus membranes or skin necrosis: No Has patient had a PCN reaction that required hospitalization No Has patient had a PCN reaction occurring within the last 10 years: No If all of the above answers are "NO", then may proceed with Cephalosporin use.  . Metronidazole Rash   Prior to Admission medications   Medication Sig Start Date End Date Taking? Authorizing Provider  naproxen (NAPROSYN) 500 MG tablet Take 500 mg by mouth 2 (two) times daily with a meal.   Yes [provider]  PREMPRO 0.45-1.5 MG tablet Take 1 tablet by mouth daily.  09/07/15  Yes [provider]     Positive ROS: All other systems have been reviewed and were otherwise negative with the exception of those mentioned in the HPI and as above.  Physical Exam: General: Alert, no acute distress Cardiovascular: Regular rate and rhythm, no murmurs rubs or gallops.  No pedal edema Respiratory: Clear to auscultation bilaterally, no wheezes rales or rhonchi. No cyanosis, no use of accessory musculature GI: No organomegaly, abdomen is soft and non-tender nondistended with positive bowel sounds. Skin: Skin intact, no lesions within the operative field. Neurologic: Sensation intact distally Psychiatric: Patient is competent for consent with normal mood and affect Lymphatic: No axillary  or cervical lymphadenopathy  MUSCULOSKELETAL: Left knee:  skin is intact.  ROM 0-120.  Tender over medial joint line..  Mild effusion, no erythema or ecchymosis.  2+ pedal pulses.  No calf tenderness.  5/5 strength in all muscle groups.  No ligamentous instability.  Sensation intact to light touch throughout the lower extremity.  Assessment: M17.12 Unilateral primary osteoarthritis, left  knee  Plan: Plan for Procedure(s): LEFT TOTAL KNEE ARTHROPLASTY  I explained to the patient and her husband the details of the operation as well as the post-op course.    I also discussed the risks and benefits of surgery. They understnad risks include but are not limited to infection, bleeding requiring blood transfusion, nerve or blood vessel injury, joint stiffness or loss of motion, persistent pain, weakness or instability, malunion, nonunion and hardware failure and the need for further surgery. Medical risks include but are not limited to DVT and pulmonary embolism, myocardial infarction, stroke, pneumonia, respiratory failure and death. Patient understood these risks and wished to proceed.    Thornton Park, MD   06/13/2017 12:50 PM

## 2017-06-13 NOTE — Transfer of Care (Signed)
Immediate Anesthesia Transfer of Care Note  Patient: Rebecca Watkins  Procedure(s) Performed: TOTAL KNEE ARTHROPLASTY (Left Knee)  Patient Location: PACU  Anesthesia Type:Spinal  Level of Consciousness: awake, drowsy and patient cooperative  Airway & Oxygen Therapy: Patient Spontanous Breathing and Patient connected to face mask oxygen  Post-op Assessment: Report given to RN and Post -op Vital signs reviewed and stable  Post vital signs: Reviewed and stable  Last Vitals:  Vitals:   06/13/17 1217 06/13/17 1535  BP: 123/88 (!) 109/56  Pulse: 78 90  Resp: 18 15  Temp: 36.6 C   SpO2: 100% 98%    Last Pain:  Vitals:   06/13/17 1217  TempSrc: Oral         Complications: No apparent anesthesia complications

## 2017-06-13 NOTE — Anesthesia Post-op Follow-up Note (Signed)
Anesthesia QCDR form completed.        

## 2017-06-13 NOTE — Anesthesia Procedure Notes (Signed)
Spinal  Patient location during procedure: OR Staffing Performed: anesthesiologist  Preanesthetic Checklist Completed: patient identified, site marked, surgical consent, pre-op evaluation, timeout performed, IV checked, risks and benefits discussed and monitors and equipment checked Spinal Block Patient position: sitting Prep: Betadine Patient monitoring: heart rate, continuous pulse ox, blood pressure and cardiac monitor Approach: midline Location: L4-5 Injection technique: single-shot Needle Needle type: Whitacre and Introducer  Needle gauge: 24 G Needle length: 9 cm Assessment Sensory level: T6 Additional Notes Negative paresthesia. Negative blood return. Positive free-flowing CSF. Expiration date of kit checked and confirmed. Patient tolerated procedure well, without complications.       

## 2017-06-13 NOTE — Anesthesia Preprocedure Evaluation (Signed)
Anesthesia Evaluation  Patient identified by MRN, date of birth, ID band Patient awake    Reviewed: Allergy & Precautions, H&P , NPO status , Patient's Chart, lab work & pertinent test results, reviewed documented beta blocker date and time   History of Anesthesia Complications (+) PONV and history of anesthetic complications  Airway Mallampati: II   Neck ROM: full    Dental  (+) Poor Dentition   Pulmonary neg pulmonary ROS,    Pulmonary exam normal        Cardiovascular negative cardio ROS Normal cardiovascular exam Rhythm:regular Rate:Normal     Neuro/Psych negative neurological ROS  negative psych ROS   GI/Hepatic negative GI ROS, Neg liver ROS,   Endo/Other  negative endocrine ROS  Renal/GU negative Renal ROS  negative genitourinary   Musculoskeletal   Abdominal   Peds  Hematology negative hematology ROS (+)   Anesthesia Other Findings Past Medical History: No date: Arthritis     Comment:  Osteoarthritis 04/2017: Breast discharge     Comment:  when expressed 2017: Diverticulitis No date: PONV (postoperative nausea and vomiting) No date: Temporary low platelet count (HCC)     Comment:  H/O Past Surgical History: 12/15/2015: COLONOSCOPY WITH PROPOFOL; N/A     Comment:  Procedure: COLONOSCOPY WITH PROPOFOL;  Surgeon: Manya Silvas, MD;  Location: [facility] ENDOSCOPY;  Service:               Endoscopy;  Laterality: N/A; 10/31/2016: KNEE ARTHROSCOPY WITH MEDIAL MENISECTOMY; Left     Comment:  Procedure: KNEE ARTHROSCOPY WITH MEDIAL MENISECTOMY;                Surgeon: Thornton Park, MD;  Location: [facility] ORS;                Service: Orthopedics;  Laterality: Left; No date: TUBAL LIGATION   Reproductive/Obstetrics negative OB ROS                             Anesthesia Physical Anesthesia Plan  ASA: III  Anesthesia Plan: General   Post-op Pain Management:     Induction:   PONV Risk Score and Plan:   Airway Management Planned:   Additional Equipment:   Intra-op Plan:   Post-operative Plan:   Informed Consent: I have reviewed the patients History and Physical, chart, labs and discussed the procedure including the risks, benefits and alternatives for the proposed anesthesia with the patient or authorized representative who has indicated his/her understanding and acceptance.   Dental Advisory Given  Plan Discussed with: CRNA  Anesthesia Plan Comments:         Anesthesia Quick Evaluation

## 2017-06-13 NOTE — Patient Outreach (Addendum)
[facility]) Care Management  06/13/2017  Rebecca Watkins 11-30-1955 [id number]   Subjective: Telephone call to patient's home number, spoke with patient, and HIPAA verified.  Discussed [facility] Care Management UMR Transition of care follow up, preoperative call follow up, patient voiced understanding, and is in agreement to both types of follow up.   Patient states her surgery is today, ready to get it over with, it was originally scheduled for 05/29/17, and it was rescheduled due to delay insurance approval.   States she is having surgery at [facility] and will be hospitalized for 1 -2 days, and will be having outpatient therapy at a [facility] facility.  Patient voices understanding of medical diagnosis, surgery, and treatment plan.  States she is accessing the following [facility] benefits: outpatient pharmacy, hospital indemnity (verbally given contact number for [facility] [phone], to verify benefits, file claim if appropriate, verbally given contact number for [facility] [employer] [phone] to obtain itemized bill if needed),  and utilizing long term disability. States she is very appreciative of the follow up and is in agreement to receive [facility] information post transition of care follow up.   Objective: Per KPN (Knowledge Performance Now, point of care tool) and chart review, patient to be admitted on 06/13/17 for Left TOTAL KNEE ARTHROPLASTY.       Assessment: Received UMR Preoperative / Transition of care referral on 05/30/17.  Preoperative call completed, and transition of care follow up pending notification of patient discharge.   Plan: RNCM will call patient for  telephone outreach attempt, transition of care follow up, within 3 business days of hospital discharge notification.      Barnes Florek H. Annia Friendly, BSN, [place] Management [facility] Telephonic CM Phone: [phone] Fax: [phone]

## 2017-06-13 NOTE — Op Note (Signed)
DATE OF SURGERY:  06/13/2017 TIME: 3:57 PM  PATIENT NAME:  Rebecca Watkins   AGE: 61 y.o.    PRE-OPERATIVE DIAGNOSIS:  M17.12 Unilateral primary osteoarthritis, left knee  POST-OPERATIVE DIAGNOSIS:  Same  PROCEDURE:  Procedure(s): LEFT TOTAL KNEE ARTHROPLASTY  SURGEON:  Thornton Park, MD   ASSISTANT:  Carlynn Spry, PA  EBL:  50 cc  Tourniquet time: 102 minutes  IVF:  1000cc  Urine Output:  300 cc  OPERATIVE IMPLANTS: Depuy PFC Sigma, Posterior Stabilized Femural component size 3, Tibia size rotating platform component size 3, Patella polyethylene 3-peg oval button size 38, with a 10 mm polyethylene insert.   PREOPERATIVE INDICATIONS:  [name] is an 61 y.o. female who has a diagnosis of left knee osteoarthritis and elected for a left total knee arthroplasty after failing nonoperative treatment, including knee arthroscopy, corticosteroid injection and physical therapy who has significant impairment of their activities of daily living.  Radiographs have demonstrated tricompartmental osteoarthritis joint space narrowing, osteophytes, subchondral sclerosis and cyst formation.  The risks, benefits, and alternatives were discussed at length including but not limited to the risks of infection, bleeding, nerve or blood vessel injury, knee stiffness, fracture, dislocation, loosening or failure of the hardware and the need for further surgery. Medical risks include but not limited to DVT and pulmonary embolism, myocardial infarction, stroke, pneumonia, respiratory failure and death. I discussed these risks with the patient in my office prior to the date of surgery. They understood these risks and were willing to proceed.  OPERATIVE DESCRIPTION:  The patient was brought to the operative room and placed in a supine position after undergoing placement of a spinal anesthetic.  A Foley catheter was placed.  IV antibiotics were given. Patient received ancef and clindamycin. The lower  extremity was prepped and draped in the usual sterile fashion.  A time out was performed to verify the patient's name, date of birth, medical record number, correct site of surgery and correct procedure to be performed. The timeout was also used to confirm the patient received antibiotics and that appropriate instruments, implants and radiographs studies were available in the room.  The leg was elevated and exsanguinated with an Esmarch and the tourniquet was inflated to 275 mmHg for 94 minutes..  A midline incision was made over the left knee. Full-thickness skin flaps were developed. A medial parapatellar arthrotomy was then made and the patella everted and the knee was brought into 90 of flexion. Hoffa's fat pad along with the cruciate ligaments and medial and lateral menisci were resected.   The distal femoral intramedullary canal was opened with a drill and the intramedullary distal femoral cutting jig was inserted into the femoral canal pinned into position. It was set at 5 degrees resecting 10 mm off the distal femur.  Care was taken to protect the collateral ligaments during distal femoral resection.  The distal femoral resection was performed with an oscillating saw. The femoral cutting guide was then removed.  The extramedullary tibial cutting guide was then placed using the anterior tibial crest and second ray of the foot as a references.  The tibial cutting guide was adjusted to allow for appropriate posterior slope.  The tibial cutting block was pinned into position. The slotted stylus was used to measure the proximal tibial resection of 10 mm off the high lateral side.  The tibial long rod alignment guide was then used to confirm position of the cutting block. A third cross pin through the tibial cutting block was then drilled  into position to allow for rotational stability. Care was taken during the tibial resection to protect the medial and collateral ligaments.  The resected tibial bone was  removed along with the posterior horns of the menisci.  The PCL was sacrificed.  Extension gap was measured with a spacer block and alignment and extension was confirmed using a long alignment rod.  The attention was then turned back to the femur. The posterior referencing distal femoral sizing guide was applied to the distal femur.  The femur was sized to be a size 3. Rotation of the referencing guide was checked with the epicondylar axis and Whitesides line. Then the 4-in-1 cutting jig was then applied to the distal femur. A stylus was used to confirm that the anterior femur would not be notched.   Then the anterior, posterior and chamfer femoral cuts were then made with an oscillating saw.  All posterior osteophytes were removed.  The flexion gap was then measured with a flexion spacer block and long alignment rod and was found to be symmetric with the extension gap and perpendicular to mechanical axis of the tibia.  The distal femoral preparation was completed by performing the posterior stabilized box cut using the cutting block. The entry site for the intramedullary femoral guide was filled with autologous bone graft from bone previously resected earlier in the case.  The proximal tibia plateau was then sized with trial trays. The best coverage was achieved with a size 3. This tibial tray was then pinned into position. The proximal tibia was then prepared with the reamer and keel punch.  After tibial preparation was completed, all trial components were inserted with polyethylene trials.  The knee was found to have excellent balance and full motion with a size 10 mm tibial polyethylene insert..    The attention was then turned to preparation of the patella. The thickness of the patella was measured with a caliper, the diameter measured with the patella templates.  The patella resection was then made with an oscillating saw using the patella cutting guide.  3 peg holes for the patella component were then  drilled. The trial patella was then placed. Knee was taken through a full range of motion and deemed to be stable with the trial components. All trial components were then removed.  The knee capsule was then injected with Exparel. The joint was copiously irrigated with pulse lavage.  The final total knee arthroplasty components were then cemented into place with a 10 mm trial polyethylene insert and all excess methylmethacrylate was removed.  The joint was again copiously irrigated. After the cement had hardened the knee was again taken through a full range of motion. It was felt to be most stable with the 10 mm tibial polyethylene insert. The actual tibial polyethylene insert was then placed.   The knee was taken through a range of motion and the patella tracked well and the knee was again irrigated copiously.    An Autovac drain was placed. The 2 drain limbs were brought out through the superior lateral knee. The medial arthrotomy was closed with #1 Ethibond. The subcutaneous tissue closed with 0 and 2-0 vicryl, and skin approximated with staples.  A dry sterile and compressive dressing was applied.  A Polar Care was applied to the operative knee along with a knee immobilizer.    The patient was awakened and brought to the PACU in stable and satisfactory condition.  In the recovery room, the autovac was not holding suction.  It  was therefore removed in PACU and a sterile gauze dressing was applied by me over the drain sites.  All sharp, lap and instrument counts were correct at the conclusion the case. I spoke with the patient's husband in the postop consultation room to let him know the case had been performed without complication and the patient was stable in recovery room.     Total tourniquet time was 102 minutes.

## 2017-06-13 NOTE — Progress Notes (Signed)
Polar care applied and right ted hose applied

## 2017-06-14 LAB — CBC
HEMATOCRIT: 33.1 % — AB (ref 35.0–47.0)
Hemoglobin: 11.3 g/dL — ABNORMAL LOW (ref 12.0–16.0)
MCH: 32.2 pg (ref 26.0–34.0)
MCHC: 34.2 g/dL (ref 32.0–36.0)
MCV: 94.1 fL (ref 80.0–100.0)
PLATELETS: 159 10*3/uL (ref 150–440)
RBC: 3.52 MIL/uL — ABNORMAL LOW (ref 3.80–5.20)
RDW: 12.5 % (ref 11.5–14.5)
WBC: 9.9 10*3/uL (ref 3.6–11.0)

## 2017-06-14 LAB — BASIC METABOLIC PANEL
ANION GAP: 4 — AB (ref 5–15)
BUN: 15 mg/dL (ref 6–20)
CALCIUM: 8.2 mg/dL — AB (ref 8.9–10.3)
CO2: 26 mmol/L (ref 22–32)
CREATININE: 0.72 mg/dL (ref 0.44–1.00)
Chloride: 109 mmol/L (ref 101–111)
GLUCOSE: 137 mg/dL — AB (ref 65–99)
Potassium: 4.2 mmol/L (ref 3.5–5.1)
Sodium: 139 mmol/L (ref 135–145)

## 2017-06-14 MED ORDER — TRAMADOL HCL 50 MG PO TABS
50.0000 mg | ORAL_TABLET | Freq: Four times a day (QID) | ORAL | Status: DC
  Administered 2017-06-14 – 2017-06-16 (×8): 50 mg via ORAL
  Filled 2017-06-14 (×8): qty 1

## 2017-06-14 MED ORDER — PROMETHAZINE HCL 25 MG/ML IJ SOLN
12.5000 mg | INTRAMUSCULAR | Status: DC | PRN
  Filled 2017-06-14: qty 1

## 2017-06-14 NOTE — Progress Notes (Signed)
Subjective:  POD #1 s/p left total knee arthroplasty.  Patient reports left knee pain as mild.  Patient had significant nausea after oxycodone. She has responded well to Phenergan. The nausea is improved. Patient performed bedside exercises with physical therapy this morning. Given her nausea she did not get out of bed. Her husband is at the bedside. Patient has no other complaints. Her Foley catheter was removed this morning.  Objective:   VITALS:   Vitals:   06/13/17 2100 06/13/17 2200 06/13/17 2300 06/14/17 0414  BP: (!) 97/49 (!) 104/59 (!) 100/52 (!) 104/59  Pulse: 72 66 60 63  Resp: 18 16 16 16   Temp:    97.8 F (36.6 C)  TempSrc:    Oral  SpO2: 96% 97% 98% 96%  Weight:      Height:        PHYSICAL EXAM: Left lower extremity:  I changed patient's dressing this morning. I clean dried blood from around her incision with alcohol swabs. I also swabbed the drain sites and applied a separate dressing over these areas. There is no active bleeding or drainage from her incisions. She had drainage on the bandages as her Autovac drain had to be removed in PACU as it was not holding suction.  Patient does not have significant effusion. There is no erythema or ecchymosis. There is no significant swelling. Her leg compartments are soft and compressible. She has palpable pedal pulses. She has intact sensation light touch distally but mild paresthesias around the incision.  She can flex and extend her toes and dorsiflex and plantarflex her ankle. Neurovascular intact Sensation intact distally Intact pulses distally Dorsiflexion/Plantar flexion intact Incision: moderate drainage No cellulitis present Compartment soft  LABS  Results for orders placed or performed during the hospital encounter of 06/13/17 (from the past 24 hour(s))  Type and screen [place]     Status: None   Collection Time: 06/13/17 12:25 PM  Result Value Ref Range   ABO/RH(D) B POS    Antibody  Screen NEG    Sample Expiration 06/16/2017   CBC     Status: Abnormal   Collection Time: 06/14/17  3:39 AM  Result Value Ref Range   WBC 9.9 3.6 - 11.0 K/uL   RBC 3.52 (L) 3.80 - 5.20 MIL/uL   Hemoglobin 11.3 (L) 12.0 - 16.0 g/dL   HCT 33.1 (L) 35.0 - 47.0 %   MCV 94.1 80.0 - 100.0 fL   MCH 32.2 26.0 - 34.0 pg   MCHC 34.2 32.0 - 36.0 g/dL   RDW 12.5 11.5 - 14.5 %   Platelets 159 150 - 440 K/uL  Basic metabolic panel     Status: Abnormal   Collection Time: 06/14/17  3:39 AM  Result Value Ref Range   Sodium 139 135 - 145 mmol/L   Potassium 4.2 3.5 - 5.1 mmol/L   Chloride 109 101 - 111 mmol/L   CO2 26 22 - 32 mmol/L   Glucose, Bld 137 (H) 65 - 99 mg/dL   BUN 15 6 - 20 mg/dL   Creatinine, Ser 0.72 0.44 - 1.00 mg/dL   Calcium 8.2 (L) 8.9 - 10.3 mg/dL   GFR calc non Af Amer >60 >60 mL/min   GFR calc Af Amer >60 >60 mL/min   Anion gap 4 (L) 5 - 15    Dg Knee Left Port  Result Date: 06/13/2017 CLINICAL DATA:  Left total knee arthroplasty. EXAM: PORTABLE LEFT KNEE - 1-2 VIEW COMPARISON:  Radiographs  09/30/2016 FINDINGS: Well seated components of a total knee arthroplasty. No complicating features are demonstrated. IMPRESSION: Well-seated components without complicating features. Electronically Signed   By: Marijo Sanes M.D.   On: 06/13/2017 16:14    Assessment/Plan: 1 Day Post-Op   Active Problems:   S/P total knee arthroplasty, left  Patient is doing well postop. Patient is only requiring Tylenol for pain at this point. The nausea has improved with Phenergan. Her laboratory studies are within acceptable limits. Her vital signs are stable. Patient will begin Lovenox for DVT prophylaxis today. She will continue physical therapy. Patient was instructed on keeping the left knee in extension with a towel under her ankle during the day and I recommend using her knee immobilizer at night. Patient is weightbearing as tolerated on the left lower extremity.  Labs will be redrawn in the  morning. Patient will be discharged home either tomorrow or the next day depending on her clinical condition.    Thornton Park , MD 06/14/2017, 11:35 AM

## 2017-06-14 NOTE — Progress Notes (Signed)
Physical Therapy Treatment Patient Details Name: Rebecca Watkins MRN: [id number] DOB: 25-May-1956 Today's Date: 06/14/2017    History of Present Illness Pt. is a 61 y.o. female who was admitted to [facility] for a Left TKR. Pt. PMHx includes: OA, Diverticulitis, post operative nausea/vomiting, and Temporary low platelet count.    PT Comments    Patient is feeling much better since morning session, demonstrating improved ability to follow commands and continued motivation. Patient's ROM -9-71 deg. Ambulated 45' with minimal complaints of pain with PT recommending easing into activity to prevent overexertion on first bout. Patient will benefit from progression in gait distance and incorporation of stairs at future session.  Follow Up Recommendations  Outpatient PT     Equipment Recommendations  Rolling walker with 5" wheels;3in1 (PT)    Recommendations for Other Services       Precautions / Restrictions Precautions Precautions: Fall Required Braces or Orthoses: Knee Immobilizer - Left Knee Immobilizer - Left: Other (comment) (At night) Restrictions Weight Bearing Restrictions: Yes LLE Weight Bearing: Weight bearing as tolerated    Mobility  Bed Mobility Overal bed mobility: Modified Independent             General bed mobility comments: Performed with HOB elevated without assistance.  Transfers Overall transfer level: Modified independent Equipment used: Rolling walker (2 wheeled)             General transfer comment: Patient moves from sit to stand and stand to sit with good safety awareness.  Ambulation/Gait Ambulation/Gait assistance: Min guard Ambulation Distance (Feet): 80 Feet Assistive device: Rolling walker (2 wheeled)       General Gait Details: Patient ambulates with RW WBAT, demonstrating good weight shifting and safety awareness.   Stairs            Wheelchair Mobility    Modified Rankin (Stroke Patients Only)       Balance Overall  balance assessment: Modified Independent                                          Cognition Arousal/Alertness: Awake/alert Behavior During Therapy: WFL for tasks assessed/performed Overall Cognitive Status: Within Functional Limits for tasks assessed                                 General Comments: Patient occasionally required multiple cues to correctly follow one-step commands      Exercises Total Joint Exercises Ankle Circles/Pumps: AROM;20 reps Quad Sets: Strengthening;20 reps Gluteal Sets: Strengthening;20 reps Short Arc Quad: Strengthening;10 reps Heel Slides: AAROM;10 reps Hip ABduction/ADduction: AAROM;Strengthening;10 reps Straight Leg Raises: AAROM;Strengthening;10 reps Goniometric ROM: -9-71 deg.    General Comments        Pertinent Vitals/Pain Pain Assessment: Faces Pain Score: 2  Faces Pain Scale: Hurts a little bit Pain Location: L Knee Pain Descriptors / Indicators: Aching Pain Intervention(s): Limited activity within patient's tolerance;Monitored during session;Ice applied    Home Living Family/patient expects to be discharged to:: Private residence Living Arrangements: Spouse/significant other Available Help at Discharge: Family;Available PRN/intermittently (Pt. husband. Pt. daughter is an OT.) Type of Home: House Home Access: Stairs to enter   Home Layout: One level Home Equipment: None (Pt. is currently borrowing a walker)      Prior Function Level of Independence: Independent      Comments: Patient previously undergoing OP  PT prior to surgery.   PT Goals (current goals can now be found in the care plan section) Acute Rehab PT Goals Patient Stated Goal: "To feel better" PT Goal Formulation: With patient Time For Goal Achievement: 06/28/17 Potential to Achieve Goals: Good Progress towards PT goals: Progressing toward goals    Frequency    BID      PT Plan Current plan remains appropriate;Equipment  recommendations need to be updated    Co-evaluation              AM-PAC PT "6 Clicks" Daily Activity  Outcome Measure  Difficulty turning over in bed (including adjusting bedclothes, sheets and blankets)?: A Little Difficulty moving from lying on back to sitting on the side of the bed? : A Little Difficulty sitting down on and standing up from a chair with arms (e.g., wheelchair, bedside commode, etc,.)?: A Little Help needed moving to and from a bed to chair (including a wheelchair)?: A Little Help needed walking in hospital room?: A Little Help needed climbing 3-5 steps with a railing? : A Little 6 Click Score: 18    End of Session Equipment Utilized During Treatment: Gait belt Activity Tolerance: Patient tolerated treatment well Patient left: in chair;with call bell/phone within reach;with chair alarm set;with family/visitor present Nurse Communication: Mobility status PT Visit Diagnosis: Muscle weakness (generalized) (M62.81);Difficulty in walking, not elsewhere classified (R26.2);Pain Pain - Right/Left: Left Pain - part of body: Knee     Time: 7829-5621 PT Time Calculation (min) (ACUTE ONLY): 28 min  Charges:  $Gait Training: 8-22 mins $Therapeutic Exercise: 8-22 mins                    G Codes:  Functional Assessment Tool Used: AM-PAC 6 Clicks Basic Mobility;Clinical judgement Functional Limitation: Mobility: Walking and moving around Mobility: Walking and Moving Around Current Status (H0865): At least 40 percent but less than 60 percent impaired, limited or restricted Mobility: Walking and Moving Around Goal Status [phone]): At least 20 percent but less than 40 percent impaired, limited or restricted       Dorice Lamas, PT, DPT 06/14/2017, 1:46 PM

## 2017-06-14 NOTE — Progress Notes (Signed)
LCSW reviewed patient notes and her expected DC date is October 29th/18 LCSW is to determine if patient is returning home with PT in home or requires a skilled nursing facility for short term rehab. Will assess later this afternoon.   [employer] LCSW [phone]

## 2017-06-14 NOTE — Progress Notes (Signed)
Physical Therapy Evaluation Patient Details Name: Brina Umeda MRN: [id number] DOB: 1955-11-17 Today's Date: 06/14/2017   History of Present Illness  Patient is a 61 y.o. female admitted on 13 Jun 2017 s/p L TKA by Dr. Mack Guise. PMH includes OA, diverticulitis, post-operative nausea/vomiting, and temporary low platelet count. 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  Clinical Impression  Patient admitted for above listed reasons. Patient on evaluation was very nauseous but willing to participate in bed exercises. Patient demonstrates good strength ~4/5 and was very motivated to participate. Patient's bed mobility/transfers/gait and formal ROM will be assessed this afternoon when feeling better. Patient plans to return home with husband, so stairs will need to be assessed prior to d/c to ensure safety.    Follow Up Recommendations Outpatient PT    Equipment Recommendations  Rolling walker with 5" wheels;3in1 (PT)    Recommendations for Other Services       Precautions / Restrictions Precautions Precautions: Fall Required Braces or Orthoses: Knee Immobilizer - Left Restrictions Weight Bearing Restrictions: Yes LLE Weight Bearing: Weight bearing as tolerated      Mobility  Bed Mobility               General bed mobility comments: Deferred at this session  Transfers                 General transfer comment: Deferred at this session  Ambulation/Gait             General Gait Details: Deferred at this session  Stairs            Wheelchair Mobility    Modified Rankin (Stroke Patients Only)       Balance                                             Pertinent Vitals/Pain Pain Assessment: Faces Faces Pain Scale: Hurts a little bit Pain Location: L Knee Pain Descriptors / Indicators: Aching Pain Intervention(s): Limited activity within patient's tolerance;Monitored during session;Ice applied    Home Living Family/patient expects  to be discharged to:: Private residence Living Arrangements: Spouse/significant other Available Help at Discharge: Family;Available PRN/intermittently Type of Home: House Home Access: Stairs to enter   [employer] of Steps: 5 Home Layout: One level Home Equipment: None      Prior Function Level of Independence: Independent         Comments: Patient previously undergoing OP PT prior to surgery.     Hand Dominance        Extremity/Trunk Assessment   Upper Extremity Assessment Upper Extremity Assessment: Overall WFL for tasks assessed    Lower Extremity Assessment Lower Extremity Assessment: Generalized weakness;LLE deficits/detail LLE Deficits / Details: Unable to fully assess due to dressing; patient able to perform straight leg raise x10, grossly 4/5 strength LLE: Unable to fully assess due to immobilization       Communication   Communication: No difficulties  Cognition Arousal/Alertness: Lethargic Behavior During Therapy: WFL for tasks assessed/performed;Flat affect Overall Cognitive Status: Difficult to assess                                 General Comments: Patient occasionally required multiple cues to correctly follow one-step commands      General Comments      Exercises Total Joint  Exercises Quad Sets: Strengthening;15 reps Gluteal Sets: Strengthening;15 reps Short Arc Quad: Strengthening;10 reps Heel Slides: AAROM;10 reps Hip ABduction/ADduction: Strengthening;10 reps Straight Leg Raises: Strengthening;10 reps   Assessment/Plan    PT Assessment Patient needs continued PT services  PT Problem List Decreased strength;Decreased range of motion;Decreased activity tolerance;Decreased balance;Decreased mobility;Decreased knowledge of use of DME;Pain;Decreased skin integrity       PT Treatment Interventions DME instruction;Gait training;Stair training;Functional mobility training;Therapeutic activities;Balance  training;Therapeutic exercise;Patient/family education    PT Goals (Current goals can be found in the Care Plan section)  Acute Rehab PT Goals Patient Stated Goal: "To feel better" PT Goal Formulation: With patient/family Time For Goal Achievement: 06/28/17 Potential to Achieve Goals: Good    Frequency BID   Barriers to discharge        Co-evaluation               AM-PAC PT "6 Clicks" Daily Activity  Outcome Measure Difficulty turning over in bed (including adjusting bedclothes, sheets and blankets)?: Unable Difficulty moving from lying on back to sitting on the side of the bed? : Unable Difficulty sitting down on and standing up from a chair with arms (e.g., wheelchair, bedside commode, etc,.)?: Unable Help needed moving to and from a bed to chair (including a wheelchair)?: A Lot Help needed walking in hospital room?: A Lot Help needed climbing 3-5 steps with a railing? : A Lot 6 Click Score: 9    End of Session   Activity Tolerance: Other (comment) (Treatment limited by nausea) Patient left: in bed;with call bell/phone within reach;with bed alarm set;with family/visitor present Nurse Communication: Mobility status PT Visit Diagnosis: Muscle weakness (generalized) (M62.81);Difficulty in walking, not elsewhere classified (R26.2);Pain Pain - Right/Left: Left Pain - part of body: Knee    Time: 0922-0942 PT Time Calculation (min) (ACUTE ONLY): 20 min   Charges:   PT Evaluation $PT Eval Low Complexity: 1 Low PT Treatments $Therapeutic Exercise: 8-22 mins   PT G Codes:   PT G-Codes **NOT FOR INPATIENT CLASS** Functional Assessment Tool Used: AM-PAC 6 Clicks Basic Mobility;Clinical judgement Functional Limitation: Mobility: Walking and moving around Mobility: Walking and Moving Around Current Status (B8466): At least 80 percent but less than 100 percent impaired, limited or restricted Mobility: Walking and Moving Around Goal Status [phone]): At least 20 percent but  less than 40 percent impaired, limited or restricted    Dorice Lamas, PT, DPT 06/14/2017, 10:37 AM

## 2017-06-14 NOTE — Evaluation (Signed)
Occupational Therapy Evaluation Patient Details Name: Rebecca Watkins MRN: [id number] DOB: May 10, 1956 Today's Date: 06/14/2017    History of Present Illness Pt. is a 61 y.o. female who was admitted to [facility] for a Left TKR. Pt. PMHx includes: OA, Diverticulitis, post operative nausea/vomiting, and Temporary low platelet count.   Clinical Impression   Pt. is a 61 y.o. female who was admitted to [facility] for a Left TKR. Pt. has had nausea, and vomiting. Pt. presents with weakness, pain, limited LE ROM, and limited functional mobility. Pt. Resides at home with her husband, and was independent with ADLs, IADLs, driving, meal preparation, and medication management. Pt. 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 Pt. education was provided about A/E use for LE ADLs. Pt. is aware of  equipment available for LE ADLs, and has a sockaide which was provided to pt. by her daughter who is an OT. Pt. reports having support at home as needed for meals. Pt. will benefit from continued skilled OT services for ADL training, A/E training, and pt. Education about DME, and home modification. Pt. could benefit from a BSCommode, a walker, and a shower chair. The case manager was notified.    Follow Up Recommendations       Equipment Recommendations       Recommendations for Other Services       Precautions / Restrictions Precautions Precautions: Fall Required Braces or Orthoses: Knee Immobilizer - Left Restrictions Weight Bearing Restrictions: Yes LLE Weight Bearing: Weight bearing as tolerated                                                    ADL either performed or assessed with clinical judgement   ADL Overall ADL's : Needs assistance/impaired Eating/Feeding: Set up   Grooming: Set up   Upper Body Bathing: Set up   Lower Body Bathing: Minimal assistance   Upper Body Dressing : Set up   Lower Body Dressing: Moderate assistance                Functional mobility during ADLs:  (TBA, secondary to nausea, and vomting.) General ADL Comments: Pt. education was provided about A/E use for LE ADLs. Pt. has a daughter who is an OT,  and has reviewed A/E with her. Pt. has a sockaid.     Vision         Perception     Praxis      Pertinent Vitals/Pain Pain Assessment: 0-10 Pain Score: 2  Faces Pain Scale: Hurts a little bit Pain Location: L Knee Pain Descriptors / Indicators: Aching Pain Intervention(s): Limited activity within patient's tolerance;Monitored during session;Ice applied     Hand Dominance Right   Extremity/Trunk Assessment Upper Extremity Assessment Upper Extremity Assessment: Overall WFL for tasks assessed       Communication Communication Communication: No difficulties   Cognition Arousal/Alertness: Awake/alert Behavior During Therapy: WFL for tasks assessed/performed Overall Cognitive Status: Within Functional Limits for tasks assessed                                 General Comments: Patient occasionally required multiple cues to correctly follow one-step commands   General Comments       Exercises   Shoulder Instructions  Home Living Family/patient expects to be discharged to:: Private residence Living Arrangements: Spouse/significant other Available Help at Discharge: Family;Available PRN/intermittently (Pt. husband. Pt. daughter is an OT.) Type of Home: House Home Access: Stairs to enter Technical brewer of Steps: 5   Home Layout: One level     Bathroom Shower/Tub: Teacher, early years/pre: Standard     Home Equipment: None (Pt. is currently borrowing a walker)          Prior Functioning/Environment Level of Independence: Independent        Comments: Patient previously undergoing OP PT prior to surgery.        OT Problem List: Decreased strength;Decreased range of motion;Decreased activity tolerance      OT  Treatment/Interventions: Self-care/ADL training;Patient/family education;DME and/or AE instruction;Therapeutic exercise    OT Goals(Current goals can be found in the care plan section) Acute Rehab OT Goals Patient Stated Goal: "To feel better"  OT Frequency: Min 1X/week   Barriers to D/C:            Co-evaluation              AM-PAC PT "6 Clicks" Daily Activity     Outcome Measure Help from another person eating meals?: None Help from another person taking care of personal grooming?: None Help from another person toileting, which includes using toliet, bedpan, or urinal?: A Little Help from another person bathing (including washing, rinsing, drying)?: A Little Help from another person to put on and taking off regular upper body clothing?: None Help from another person to put on and taking off regular lower body clothing?: A Little 6 Click Score: 21   End of Session    Activity Tolerance: Patient tolerated treatment well Patient left: in bed;with call bell/phone within reach;with bed alarm set  OT Visit Diagnosis: Muscle weakness (generalized) (M62.81)                Time: 8768-1157 OT Time Calculation (min): 19 min Charges:  OT General Charges $OT Visit: 1 Visit OT Evaluation $OT Eval Low Complexity: 1 Low G-Codes: OT G-codes **NOT FOR INPATIENT CLASS** Functional Limitation: Self care Self Care Current Status (W6203): At least 20 percent but less than 40 percent impaired, limited or restricted Self Care Goal Status (T5974): At least 1 percent but less than 20 percent impaired, limited or restricted   Harrel Carina, MS, OTR/L   Harrel Carina, MS, OTR/L 06/14/2017, 12:36 PM

## 2017-06-15 LAB — CBC
HEMATOCRIT: 29.2 % — AB (ref 35.0–47.0)
HEMOGLOBIN: 9.9 g/dL — AB (ref 12.0–16.0)
MCH: 32.1 pg (ref 26.0–34.0)
MCHC: 34.1 g/dL (ref 32.0–36.0)
MCV: 94.2 fL (ref 80.0–100.0)
Platelets: 132 10*3/uL — ABNORMAL LOW (ref 150–440)
RBC: 3.1 MIL/uL — ABNORMAL LOW (ref 3.80–5.20)
RDW: 12.7 % (ref 11.5–14.5)
WBC: 5.8 10*3/uL (ref 3.6–11.0)

## 2017-06-15 MED ORDER — FLEET ENEMA 7-19 GM/118ML RE ENEM: 1.0000 | ENEMA | Freq: Once | RECTAL | Status: DC

## 2017-06-15 MED ORDER — BISACODYL 10 MG RE SUPP: 10.0000 mg | Freq: Once | RECTAL | Status: DC

## 2017-06-15 NOTE — Progress Notes (Signed)
  Subjective:  POD #2  Patient reports pain as moderate.  She rates it today as 7/10.  Pain is increased compared to yesterday. Patient is OOB to chair.  Norco helps to improve her pain.   Objective:   VITALS:   Vitals:   06/14/17 0740 06/14/17 1356 06/14/17 1940 06/15/17 0805  BP: (!) 101/46 (!) 109/51 (!) 105/43 (!) 108/57  Pulse: 76 85 80 79  Resp: 16 18 16 18   Temp: 98 F (36.7 C) 97.8 F (36.6 C) 99 F (37.2 C) 98.2 F (36.8 C)  TempSrc:  Oral Oral Oral  SpO2: 100% 100% 97% 96%  Weight:      Height:        PHYSICAL EXAM: Left lower extremity: Neurovascular intact Sensation intact distally Intact pulses distally Dorsiflexion/Plantar flexion intact Incision: no drainage No cellulitis present Compartment soft  LABS  Results for orders placed or performed during the hospital encounter of 06/13/17 (from the past 24 hour(s))  CBC     Status: Abnormal   Collection Time: 06/15/17  3:31 AM  Result Value Ref Range   WBC 5.8 3.6 - 11.0 K/uL   RBC 3.10 (L) 3.80 - 5.20 MIL/uL   Hemoglobin 9.9 (L) 12.0 - 16.0 g/dL   HCT 29.2 (L) 35.0 - 47.0 %   MCV 94.2 80.0 - 100.0 fL   MCH 32.1 26.0 - 34.0 pg   MCHC 34.1 32.0 - 36.0 g/dL   RDW 12.7 11.5 - 14.5 %   Platelets 132 (L) 150 - 440 K/uL    Dg Knee Left Port  Result Date: 06/13/2017 CLINICAL DATA:  Left total knee arthroplasty. EXAM: PORTABLE LEFT KNEE - 1-2 VIEW COMPARISON:  Radiographs 09/30/2016 FINDINGS: Well seated components of a total knee arthroplasty. No complicating features are demonstrated. IMPRESSION: Well-seated components without complicating features. Electronically Signed   By: Marijo Sanes M.D.   On: 06/13/2017 16:14    Assessment/Plan: 2 Days Post-Op   Active Problems:   S/P total knee arthroplasty, left  Patient continued to make progress postop. She'll continue physical therapy her left knee range of motion and lower extremity strengthening. She may be weightbearing as tolerated on the left lower  extremity. Patient will continue to elevate and use the Polar Care to reduce swelling.  Continue Lovenox for DVT prophylaxis. I anticipate discharge home tomorrow.    Thornton Park , MD 06/15/2017, 1:01 PM

## 2017-06-15 NOTE — Progress Notes (Signed)
Physical Therapy Treatment Patient Details Name: Rebecca Watkins MRN: [id number] DOB: 08/01/1956 Today's Date: 06/15/2017    History of Present Illness Pt. is a 61 y.o. female who was admitted to [facility] for a Left TKR. Pt. PMHx includes: OA, Diverticulitis, post operative nausea/vomiting, and Temporary low platelet count.    PT Comments    Patient with slightly elevated pain levels this morning but still motivated to participate in PT. Patient demonstrates ability to negotiate stairs with railings and minimal assistance. Educated in shorter, frequent bouts of activity to prevent overdoing activity. Will benefit from progressions in gait distances as tolerated.   Follow Up Recommendations  Outpatient PT     Equipment Recommendations  Rolling walker with 5" wheels;3in1 (PT)    Recommendations for Other Services       Precautions / Restrictions Precautions Precautions: Fall Required Braces or Orthoses: Knee Immobilizer - Left Knee Immobilizer - Left: Other (comment) Restrictions Weight Bearing Restrictions: Yes LLE Weight Bearing: Weight bearing as tolerated    Mobility  Bed Mobility Overal bed mobility: Modified Independent             General bed mobility comments: Performed with HOB elevated with CGA.  Transfers Overall transfer level: Modified independent Equipment used: Rolling walker (2 wheeled)             General transfer comment: Patient moves from sit to stand with weightshift R to decrease weightbearing on L.  Ambulation/Gait Ambulation/Gait assistance: Min guard Ambulation Distance (Feet): 100 Feet Assistive device: Rolling walker (2 wheeled)       General Gait Details: Patient ambulates at decreased cadence, demonstrating mild antalgic gait patterning with toe touch WB. Required cues to improve weightshifting and not get too close to front of RW. Utilized standing rest breaks due to pain.   Stairs Stairs: Yes   Stair Management: Two  rails Number of Stairs: 4 General stair comments: Patient ascends with R and descends with L with mild difficulty and verbal cues for sequencing.  Wheelchair Mobility    Modified Rankin (Stroke Patients Only)       Balance Overall balance assessment: Modified Independent                                          Cognition Arousal/Alertness: Awake/alert Behavior During Therapy: WFL for tasks assessed/performed Overall Cognitive Status: Within Functional Limits for tasks assessed                                        Exercises Total Joint Exercises Ankle Circles/Pumps: AROM;20 reps Quad Sets: Strengthening;15 reps Gluteal Sets: Strengthening;15 reps Short Arc Quad: Strengthening;10 reps Heel Slides: AAROM;10 reps Hip ABduction/ADduction: AAROM;Strengthening;10 reps Straight Leg Raises: AAROM;Strengthening;10 reps Goniometric ROM: -8-68 deg.    General Comments        Pertinent Vitals/Pain Pain Assessment: Faces Faces Pain Scale: Hurts little more Pain Location: L Knee Pain Descriptors / Indicators: Aching Pain Intervention(s): Limited activity within patient's tolerance;Monitored during session;Repositioned;Patient requesting pain meds-RN notified;Ice applied    Home Living                      Prior Function            PT Goals (current goals can now be found in the care plan section)  Acute Rehab PT Goals Patient Stated Goal: "To feel better" PT Goal Formulation: With patient Time For Goal Achievement: 06/28/17 Potential to Achieve Goals: Good Progress towards PT goals: Progressing toward goals    Frequency    BID      PT Plan Current plan remains appropriate    Co-evaluation              AM-PAC PT "6 Clicks" Daily Activity  Outcome Measure  Difficulty turning over in bed (including adjusting bedclothes, sheets and blankets)?: A Little Difficulty moving from lying on back to sitting on the side  of the bed? : A Little Difficulty sitting down on and standing up from a chair with arms (e.g., wheelchair, bedside commode, etc,.)?: A Little Help needed moving to and from a bed to chair (including a wheelchair)?: A Little Help needed walking in hospital room?: A Little Help needed climbing 3-5 steps with a railing? : A Little 6 Click Score: 18    End of Session Equipment Utilized During Treatment: Gait belt Activity Tolerance: Patient tolerated treatment well;Patient limited by pain Patient left: in bed;with call bell/phone within reach;with bed alarm set;with family/visitor present;with SCD's reapplied Nurse Communication: Patient requests pain meds PT Visit Diagnosis: Muscle weakness (generalized) (M62.81);Difficulty in walking, not elsewhere classified (R26.2);Pain Pain - Right/Left: Left Pain - part of body: Knee     Time: 6503-5465 PT Time Calculation (min) (ACUTE ONLY): 27 min  Charges:  $Gait Training: 8-22 mins $Therapeutic Exercise: 8-22 mins                    G Codes:  Functional Assessment Tool Used: AM-PAC 6 Clicks Basic Mobility;Clinical judgement Functional Limitation: Mobility: Walking and moving around Mobility: Walking and Moving Around Current Status (K8127): At least 40 percent but less than 60 percent impaired, limited or restricted Mobility: Walking and Moving Around Goal Status [phone]): At least 20 percent but less than 40 percent impaired, limited or restricted       Dorice Lamas, PT, DPT 06/15/2017, 9:05 AM

## 2017-06-15 NOTE — Care Management Note (Signed)
Case Management Note  Patient Details  Name: Rebecca Watkins  MRN: [id number] Date of Birth: August 28, 1955  Subjective/Objective:    [name] reports that she does not want a BSC. Jermaine at [facility] will deliver a RW today but states that [name] will have to purchase a toilet seat riser at the [employer] store on [facility] [address] [facility]. Dr Mack Guise has already arranged OP-PT for [name].                 Action/Plan:   Expected Discharge Date:                  Expected Discharge Plan:  Home/Self Care  In-House Referral:  NA  Discharge planning Services  CM Consult  Post Acute Care Choice:  Durable Medical Equipment (Outpatient PT) Choice offered to:  Patient  DME Arranged:  [facility] Rile DME Agency:  [place]:   (OP-PT arranged) [place] Agency:  NA  Status of Service:  Completed, signed off  If discussed at [place] of Stay Meetings, dates discussed:    Additional Comments:  Miel Wisener A, RN 06/15/2017, 9:10 AM

## 2017-06-15 NOTE — Anesthesia Postprocedure Evaluation (Signed)
Anesthesia Post Note  Patient: Rebecca Watkins  Procedure(s) Performed: TOTAL KNEE ARTHROPLASTY (Left Knee)  Patient location during evaluation: Nursing Unit Anesthesia Type: Spinal Level of consciousness: oriented and awake and alert Pain management: pain level controlled Vital Signs Assessment: post-procedure vital signs reviewed and stable Respiratory status: spontaneous breathing and respiratory function stable Cardiovascular status: blood pressure returned to baseline and stable Postop Assessment: no headache, no backache and no apparent nausea or vomiting Anesthetic complications: no Comments: Spinal worn off, up with PT, no complaints     Last Vitals:  Vitals:   06/14/17 1940 06/15/17 0805  BP: (!) 105/43 (!) 108/57  Pulse: 80 79  Resp: 16 18  Temp: 37.2 C 36.8 C  SpO2: 97% 96%    Last Pain:  Vitals:   06/15/17 1111  TempSrc:   PainSc: 4                  Martha Clan

## 2017-06-16 ENCOUNTER — Encounter: Payer: Self-pay | Admitting: Orthopedic Surgery

## 2017-06-16 LAB — CBC
HEMATOCRIT: 26.1 % — AB (ref 35.0–47.0)
HEMOGLOBIN: 9 g/dL — AB (ref 12.0–16.0)
MCH: 32.3 pg (ref 26.0–34.0)
MCHC: 34.4 g/dL (ref 32.0–36.0)
MCV: 94 fL (ref 80.0–100.0)
Platelets: 136 10*3/uL — ABNORMAL LOW (ref 150–440)
RBC: 2.77 MIL/uL — ABNORMAL LOW (ref 3.80–5.20)
RDW: 12.5 % (ref 11.5–14.5)
WBC: 5.3 10*3/uL (ref 3.6–11.0)

## 2017-06-16 MED ORDER — TRAMADOL HCL 50 MG PO TABS: 50.0000 mg | ORAL_TABLET | Freq: Four times a day (QID) | ORAL | 0 refills | Status: DC | PRN

## 2017-06-16 MED ORDER — ENOXAPARIN SODIUM 30 MG/0.3ML ~~LOC~~ SOLN: 40.0000 mg | SUBCUTANEOUS | 1 refills | Status: DC

## 2017-06-16 NOTE — Discharge Summary (Signed)
Physician Discharge Summary  Patient ID: Rebecca Watkins MRN: [id number] DOB/AGE: [birth year] 61 y.o.  Admit date: 06/13/2017 Discharge date: 06/16/2017  Admission Diagnoses:  M17.12 Unilateral primary osteoarthritis, left knee <principal problem not specified>  Discharge Diagnoses:  M17.12 Unilateral primary osteoarthritis, left knee Active Problems:   S/P total knee arthroplasty, left   Past Medical History:  Diagnosis Date  . Arthritis    Osteoarthritis  . Breast discharge 04/2017   when expressed  . Diverticulitis 2017  . PONV (postoperative nausea and vomiting)   . Temporary low platelet count ([place])    H/O    Surgeries: Procedure(s): LEFTTOTAL KNEE ARTHROPLASTY on 06/13/2017   Consultants (if any):   Discharged Condition: Improved  Hospital Course: [name] is an 61 y.o. female who was admitted 06/13/2017 with a diagnosis of  M17.12 Unilateral primary osteoarthritis, left knee <principal problem not specified> and went to the operating room on 06/13/2017 and underwent an uncomplicated left total knee arthroplasty.    She was given perioperative antibiotics:  Anti-infectives    Start     Dose/Rate Route Frequency Ordered Stop   06/13/17 1900  ceFAZolin (ANCEF) IVPB 1 g/50 mL premix     1 g 100 mL/hr over 30 Minutes Intravenous Every 6 hours 06/13/17 1658 06/14/17 0130   06/13/17 1152  clindamycin (CLEOCIN) 600 MG/50ML IVPB    Comments:  Slemenda, Debra   : cabinet override      06/13/17 1152 06/13/17 1313   06/13/17 1152  ceFAZolin (ANCEF) 2-4 GM/100ML-% IVPB    Comments:  Slemenda, Debra   : cabinet override      06/13/17 1152 06/13/17 1308   06/13/17 0600  ceFAZolin (ANCEF) IVPB 2g/100 mL premix     2 g 200 mL/hr over 30 Minutes Intravenous On call to O.R. 06/12/17 2120 06/13/17 1338   06/12/17 2130  clindamycin (CLEOCIN) IVPB 600 mg     600 mg 100 mL/hr over 30 Minutes Intravenous  Once 06/12/17 2120 06/13/17 1343    .  Patient was admitted to  the orthopedic floor following her surgery.  She was given sequential compression devices, early ambulation, and Lovenox for DVT prophylaxis.  She benefited maximally from the hospital stay and there were no complications.    Recent vital signs:  Vitals:   06/16/17 0900 06/16/17 1011  BP: 113/61   Pulse: 78 82  Resp: 18   Temp: 97.7 F (36.5 C)   SpO2: 99% 98%    Recent laboratory studies:  Lab Results  Component Value Date   HGB 9.0 (L) 06/16/2017   HGB 9.9 (L) 06/15/2017   HGB 11.3 (L) 06/14/2017   Lab Results  Component Value Date   WBC 5.3 06/16/2017   PLT 136 (L) 06/16/2017   Lab Results  Component Value Date   INR 0.99 05/21/2017   Lab Results  Component Value Date   NA 139 06/14/2017   K 4.2 06/14/2017   CL 109 06/14/2017   CO2 26 06/14/2017   BUN 15 06/14/2017   CREATININE 0.72 06/14/2017   GLUCOSE 137 (H) 06/14/2017    Discharge Medications:   Allergies as of 06/16/2017      Reactions   Dilaudid [hydromorphone Hcl] Nausea And Vomiting   Ciprofloxacin Rash   Erythromycin Rash, Other (See Comments)   Has patient had a PCN reaction causing immediate rash, facial/tongue/throat swelling, SOB or lightheadedness with hypotension: Yes Has patient had a PCN reaction causing severe rash involving mucus membranes or skin necrosis: No Has  patient had a PCN reaction that required hospitalization No Has patient had a PCN reaction occurring within the last 10 years: No If all of the above answers are "NO", then may proceed with Cephalosporin use.   Metronidazole Rash      Medication List    STOP taking these medications   naproxen 500 MG tablet Commonly known as:  NAPROSYN     TAKE these medications   enoxaparin 30 MG/0.3ML injection Commonly known as:  LOVENOX Inject 0.4 mLs (40 mg total) into the skin daily.   PREMPRO 0.45-1.5 MG tablet Generic drug:  estrogen (conjugated)-medroxyprogesterone Take 1 tablet by mouth daily.   traMADol 50 MG  tablet Commonly known as:  ULTRAM Take 1 tablet (50 mg total) by mouth every 6 (six) hours as needed for moderate pain.            Durable Medical Equipment        Start     Ordered   06/15/17 [phone]  For home use only DME Walker rolling  Once    Comments:  Front wheel rolling walker  Question:  Patient needs a walker to treat with the following condition  Answer:  S/P total knee arthroplasty, left   06/15/17 0903      Diagnostic Studies: Chest 2 View  Result Date: 05/21/2017 CLINICAL DATA:  Preop for knee replacement. EXAM: CHEST  2 VIEW COMPARISON:  None. FINDINGS: Normal heart size and mediastinal contours. No acute infiltrate or edema. No effusion or pneumothorax. No acute osseous findings. IMPRESSION: Negative chest. Electronically Signed   By: Monte Fantasia M.D.   On: 05/21/2017 15:26   Dg Knee Left Port  Result Date: 06/13/2017 CLINICAL DATA:  Left total knee arthroplasty. EXAM: PORTABLE LEFT KNEE - 1-2 VIEW COMPARISON:  Radiographs 09/30/2016 FINDINGS: Well seated components of a total knee arthroplasty. No complicating features are demonstrated. IMPRESSION: Well-seated components without complicating features. Electronically Signed   By: Marijo Sanes M.D.   On: 06/13/2017 16:14   US Breast Ltd Uni Left Inc Axilla  Result Date: 05/28/2017 CLINICAL DATA:  Short-term interval follow-up of left breast cysts. Patient states that she had 1 episode of expressed bloody left nipple discharge. EXAM: 2D DIGITAL DIAGNOSTIC BILATERAL MAMMOGRAM WITH CAD AND ADJUNCT TOMO ULTRASOUND LEFT BREAST COMPARISON:  Previous exam(s). ACR Breast Density Category b: There are scattered areas of fibroglandular density. FINDINGS: There are 2 adjacent circumscribed masses in the lateral aspect of the left breast. The inferior nodule is smaller compared to the prior exams. Superior nodule is unchanged. No suspicious mass or malignant type microcalcifications identified in either breast. Mammographic  images were processed with CAD. On physical exam, no nipple discharge can be expressed from the left breast. No mass is palpated in the subareolar region. Targeted ultrasound is performed, showing 2 adjacent simple cysts in the left breast at 3 o'clock 6 cm from the nipple. They measure 10 x 4 x 10 mm and 5 x 5 x 4 mm. Previously they measured 10 x 6 x 8 mm and 10 x 4 x 7 mm. Sonographic evaluation of the subareolar region of the breast shows normal ducts with no intraductal mass. IMPRESSION: No evidence of malignancy in either breast. RECOMMENDATION: Bilateral screening mammogram in 1 year is recommended. The patient states that her primary care physician is going to refer her to general surgery for the bloody nipple discharge. MRI may be useful for further evaluation of the nipple discharge. I have discussed the findings and recommendations  with the patient. Results were also provided in writing at the conclusion of the visit. If applicable, a reminder letter will be sent to the patient regarding the next appointment. BI-RADS CATEGORY  1: Negative. Electronically Signed   By: Lillia Mountain M.D.   On: 05/28/2017 15:55   Mm Diag Breast Tomo Bilateral  Result Date: 05/28/2017 CLINICAL DATA:  Short-term interval follow-up of left breast cysts. Patient states that she had 1 episode of expressed bloody left nipple discharge. EXAM: 2D DIGITAL DIAGNOSTIC BILATERAL MAMMOGRAM WITH CAD AND ADJUNCT TOMO ULTRASOUND LEFT BREAST COMPARISON:  Previous exam(s). ACR Breast Density Category b: There are scattered areas of fibroglandular density. FINDINGS: There are 2 adjacent circumscribed masses in the lateral aspect of the left breast. The inferior nodule is smaller compared to the prior exams. Superior nodule is unchanged. No suspicious mass or malignant type microcalcifications identified in either breast. Mammographic images were processed with CAD. On physical exam, no nipple discharge can be expressed from the left breast.  No mass is palpated in the subareolar region. Targeted ultrasound is performed, showing 2 adjacent simple cysts in the left breast at 3 o'clock 6 cm from the nipple. They measure 10 x 4 x 10 mm and 5 x 5 x 4 mm. Previously they measured 10 x 6 x 8 mm and 10 x 4 x 7 mm. Sonographic evaluation of the subareolar region of the breast shows normal ducts with no intraductal mass. IMPRESSION: No evidence of malignancy in either breast. RECOMMENDATION: Bilateral screening mammogram in 1 year is recommended. The patient states that her primary care physician is going to refer her to general surgery for the bloody nipple discharge. MRI may be useful for further evaluation of the nipple discharge. I have discussed the findings and recommendations with the patient. Results were also provided in writing at the conclusion of the visit. If applicable, a reminder letter will be sent to the patient regarding the next appointment. BI-RADS CATEGORY  1: Negative. Electronically Signed   By: Lillia Mountain M.D.   On: 05/28/2017 15:55    Disposition: 01-Home or Self Care  Discharge Instructions    Call MD / Call 911    Complete by:  As directed    If you experience chest pain or shortness of breath, CALL 911 and be transported to the hospital emergency room.  If you develope a fever above 101 F, pus (white drainage) or increased drainage or redness at the wound, or calf pain, call your surgeon's office.   Constipation Prevention    Complete by:  As directed    Drink plenty of fluids.  Prune juice may be helpful.  You may use a stool softener, such as Colace (over the counter) 100 mg twice a day.  Use MiraLax (over the counter) for constipation as needed.   Diet general    Complete by:  As directed    Discharge instructions    Complete by:  As directed    Continue WBAT on the left lower extremity. Continue to use TED stockings until follow-up. Patient may remove them at night for sleep. Elevate the left lower extremity  whenever possible. Continue to use knee immobilizer at night or when lying in bed or when elevating the operative leg. The patient may remove the knee immobilizer to perform exercises or sit in a chair. Continue using the Polar Care for comfort. Keep incision clean and dry. Cover the left knee incision during showers with a plastic bag or Saran wrap.  Take lovenox 40 mg SQ daily  for blood clot prevention. Continue to work on knee range of motion exercises at home as instructed by physical therapy. Continue to use a walker for assistance with ambulation until follow-up.   Driving restrictions    Complete by:  As directed    No driving until off of pain medications.   Increase activity slowly as tolerated    Complete by:  As directed    Lifting restrictions    Complete by:  As directed    No lifting for 12-16 weeks         Signed: [place] ,MD 06/16/2017, 12:57 PM

## 2017-06-16 NOTE — Progress Notes (Signed)
Pt ready for d/c home today per MD. Reviewed discharge instructions and prescriptions with pt and husband, all questions answered. PIV removed, belongings packed. Pt is set up with outpatient PT, and pt has follow up with MD made.   [place], Jerry Caras

## 2017-06-16 NOTE — Progress Notes (Signed)
  Subjective:  POD #3 s/p left TOTAL KNEE ARTHROPLASTY.  Patient reports pain as mild.  Patient has been ambulating to the hallway with her husband this afternoon. She is tolerating by mouth diet. She has had a bowel movement.  Objective:   VITALS:   Vitals:   06/15/17 1513 06/15/17 1958 06/16/17 0900 06/16/17 1011  BP: (!) 121/56 (!) 107/55 113/61   Pulse: 74 86 78 82  Resp: 18 18 18    Temp: 98.5 F (36.9 C) 98.4 F (36.9 C) 97.7 F (36.5 C)   TempSrc: Axillary Axillary Oral   SpO2: 99% 100% 99% 98%  Weight:      Height:        PHYSICAL EXAM: Left lower extremity:  Patient's dressing was changed by me today. There is no erythema or drainage. Patient has mild swelling and resolving ecchymosis. There is an area of ecchymosis at the distal end of her incision. Neurovascular intact Sensation intact distally Intact pulses distally Dorsiflexion/Plantar flexion intact Incision: no drainage No cellulitis present Compartment soft  LABS  Results for orders placed or performed during the hospital encounter of 06/13/17 (from the past 24 hour(s))  CBC     Status: Abnormal   Collection Time: 06/16/17  5:01 AM  Result Value Ref Range   WBC 5.3 3.6 - 11.0 K/uL   RBC 2.77 (L) 3.80 - 5.20 MIL/uL   Hemoglobin 9.0 (L) 12.0 - 16.0 g/dL   HCT 26.1 (L) 35.0 - 47.0 %   MCV 94.0 80.0 - 100.0 fL   MCH 32.3 26.0 - 34.0 pg   MCHC 34.4 32.0 - 36.0 g/dL   RDW 12.5 11.5 - 14.5 %   Platelets 136 (L) 150 - 440 K/uL    No results found.  Assessment/Plan: 3 Days Post-Op   Active Problems:   S/P total knee arthroplasty, left  Patient did well postop. Patient's pain is controlled. She states she is ready to go home. She is weightbearing as tolerated on the left lower extremity. She will use a walker for assistance with ambulation.  Patient is set up for physical therapy in our office later this week. She will be discharged on Lovenox 40 mg daily for DVT prophylaxis.    [place] ,  MD 06/16/2017, 12:50 PM

## 2017-06-16 NOTE — Care Management Note (Signed)
Case Management Note  Patient Details  Name: Lalena Salas MRN: [id number] Date of Birth: 06-11-1956  Subjective/Objective:   TC to Dr. Cindi Carbon to clarify [place]. Called Lovenox 40 mg # 30 no refills to [facility]. Patient updated on cost of 52.00 for 20 days. Pharmacy to submit insurance authorization for the remaining 10.                 Action/Plan:   Expected Discharge Date:  06/16/17               Expected Discharge Plan:  Home/Self Care  In-House Referral:  NA  Discharge planning Services  CM Consult  Post Acute Care Choice:  Durable Medical Equipment (Outpatient PT) Choice offered to:  Patient  DME Arranged:  [facility] Rile DME Agency:  [place]:   (OP-PT arranged) [place] Agency:  NA  Status of Service:  Completed, signed off  If discussed at [place] of Stay Meetings, dates discussed:    Additional Comments:  Jolly Mango, RN 06/16/2017, 1:38 PM

## 2017-06-16 NOTE — Progress Notes (Signed)
Physical Therapy Treatment Patient Details Name: Rebecca Watkins MRN: [id number] DOB: Dec 27, 1955 Today's Date: 06/16/2017    History of Present Illness Pt. is a 61 y.o. female who was admitted to [facility] for a Left TKR. Pt. PMHx includes: OA, Diverticulitis, post operative nausea/vomiting, and Temporary low platelet count.    PT Comments    Pt agreeable to PT; reports mild pain (1/10) left knee. Pt requires education for proper use of left knee with transfers; demonstrates understanding and increased use post education. Pt also requires extensive education of proper rolling walker placement, reciprocal gait pattern and decreased speed to allow for improved quality with ambulation; pt also able to demonstrated this on latter half of walk. Pt wishes re education on stair climbing. Pt does need education on sequence prior to and during performance for correct technique. Simulated home set up, as pt does not have rails. Pt does need Min A/support; encouraged purchasing a SPC for additional support on stairs (spouse providing other support/assist). Spouse present for all of session/education. Spouse has several questions regarding polar care use, activity level and length of time for healing/advancement. Questions answered to satisfaction. Pt/spouse also educated on exercise/stretching frequency, repetitions, progression, duration and intensity. Continue PT to progress strength and quality of transfers, ambulation and stair climbing. Pt expects to be discharged today to home with out patient PT.   Follow Up Recommendations  Outpatient PT (pt does not wish HHPT)     Equipment Recommendations  Other (comment) (received rw)    Recommendations for Other Services       Precautions / Restrictions Restrictions Weight Bearing Restrictions: Yes LLE Weight Bearing: Weight bearing as tolerated    Mobility  Bed Mobility               General bed mobility comments: Not tested; up in  chair  Transfers Overall transfer level: Needs assistance Equipment used: Rolling walker (2 wheeled) Transfers: Sit to/from Stand Sit to Stand: Supervision         General transfer comment: Requires cues for increased use of LLE, as pt really avoiids using until educated to do so  Ambulation/Gait Ambulation/Gait assistance: Min guard Ambulation Distance (Feet): 300 Feet Assistive device: Rolling walker (2 wheeled) Gait Pattern/deviations: Step-to pattern;Step-through pattern   Gait velocity interpretation: Below normal speed for age/gender General Gait Details: Initially step tol required extensive cueing, rw lowered, education and slowed pace to attain reciprocal pattern and improved placement of rw. Pt able to demonstrate during later half of walk   Stairs     Stair Management: No rails;Step to pattern;Forwards (HHA of 1 and Min guard/A of another as needed) Number of Stairs: 4 General stair comments: Pt does not have rails at home; therefore mimiked home set up. Pt requries Min A of 1 for wt shift/lean/support. Also requires education of proper sequence, as pt does not remember and also requires cueing during performance. Spouse present and educated as well.   Wheelchair Mobility    Modified Rankin (Stroke Patients Only)       Balance Overall balance assessment: Needs assistance Sitting-balance support: Feet supported Sitting balance-Leahy Scale: Good     Standing balance support: Bilateral upper extremity supported Standing balance-Leahy Scale: Fair (Fair+) Standing balance comment: without rw on steps requires additional support for safety; encouraged purchasing a SPC                            Cognition Arousal/Alertness: Awake/alert Behavior During Therapy: [facility]  for tasks assessed/performed Overall Cognitive Status: Within Functional Limits for tasks assessed                                        Exercises Total Joint  Exercises Quad Sets: Strengthening;Both;20 reps (also educated in stand pre gait) Knee Flexion: AROM;Left;10 reps;Seated (3 positions each rep with 10 sec hold each) Goniometric ROM: 4-91    General Comments        Pertinent Vitals/Pain Pain Assessment: 0-10 Pain Score: 1  Pain Location: L Knee Pain Descriptors / Indicators: Dull;Sore Pain Intervention(s): Premedicated before session    Home Living                      Prior Function            PT Goals (current goals can now be found in the care plan section) Progress towards PT goals: Progressing toward goals    Frequency    BID      PT Plan Current plan remains appropriate    Co-evaluation              AM-PAC PT "6 Clicks" Daily Activity  Outcome Measure                   End of Session Equipment Utilized During Treatment: Gait belt Activity Tolerance: Patient tolerated treatment well;Patient limited by pain Patient left: in chair;with call bell/phone within reach;with family/visitor present;Other (comment) (polar care in place)   PT Visit Diagnosis: Muscle weakness (generalized) (M62.81);Difficulty in walking, not elsewhere classified (R26.2);Pain Pain - Right/Left: Left Pain - part of body: Knee     Time: 1011-1100 PT Time Calculation (min) (ACUTE ONLY): 49 min  Charges:  $Gait Training: 23-37 mins $Therapeutic Exercise: 8-22 mins                    G Codes:        Larae Grooms, PTA 06/16/2017, 11:50 AM

## 2017-06-18 ENCOUNTER — Encounter: Payer: Self-pay | Admitting: Emergency Medicine

## 2017-06-18 ENCOUNTER — Emergency Department
Admission: EM | Admit: 2017-06-18 | Discharge: 2017-06-19 | Disposition: A | Discharge status: Home / Self Care | Payer: 59 | Attending: Emergency Medicine | Admitting: Emergency Medicine

## 2017-06-18 ENCOUNTER — Other Ambulatory Visit: Payer: Self-pay | Admitting: *Deleted

## 2017-06-18 ENCOUNTER — Ambulatory Visit: Payer: 59 | Attending: Orthopedic Surgery

## 2017-06-18 ENCOUNTER — Encounter: Payer: Self-pay | Admitting: *Deleted

## 2017-06-18 ENCOUNTER — Emergency Department: Payer: 59

## 2017-06-18 DIAGNOSIS — Z96652 Presence of left artificial knee joint: Secondary | ICD-10-CM | POA: Insufficient documentation

## 2017-06-18 DIAGNOSIS — M25662 Stiffness of left knee, not elsewhere classified: Secondary | ICD-10-CM | POA: Diagnosis not present

## 2017-06-18 DIAGNOSIS — M7989 Other specified soft tissue disorders: Secondary | ICD-10-CM

## 2017-06-18 DIAGNOSIS — Z9889 Other specified postprocedural states: Secondary | ICD-10-CM | POA: Insufficient documentation

## 2017-06-18 DIAGNOSIS — Z79899 Other long term (current) drug therapy: Secondary | ICD-10-CM | POA: Diagnosis not present

## 2017-06-18 DIAGNOSIS — R2242 Localized swelling, mass and lump, left lower limb: Secondary | ICD-10-CM | POA: Diagnosis not present

## 2017-06-18 DIAGNOSIS — M25562 Pain in left knee: Secondary | ICD-10-CM | POA: Insufficient documentation

## 2017-06-18 DIAGNOSIS — R278 Other lack of coordination: Secondary | ICD-10-CM | POA: Insufficient documentation

## 2017-06-18 LAB — CBC
HEMATOCRIT: 27.6 % — AB (ref 35.0–47.0)
HEMOGLOBIN: 9.3 g/dL — AB (ref 12.0–16.0)
MCH: 31.7 pg (ref 26.0–34.0)
MCHC: 33.8 g/dL (ref 32.0–36.0)
MCV: 93.7 fL (ref 80.0–100.0)
PLATELETS: 206 10*3/uL (ref 150–440)
RBC: 2.95 MIL/uL — ABNORMAL LOW (ref 3.80–5.20)
RDW: 12.4 % (ref 11.5–14.5)
WBC: 5.5 10*3/uL (ref 3.6–11.0)

## 2017-06-18 LAB — BASIC METABOLIC PANEL
Anion gap: 9 (ref 5–15)
BUN: 10 mg/dL (ref 6–20)
CO2: 27 mmol/L (ref 22–32)
Calcium: 8.9 mg/dL (ref 8.9–10.3)
Chloride: 101 mmol/L (ref 101–111)
Creatinine, Ser: 0.58 mg/dL (ref 0.44–1.00)
GFR calc Af Amer: >60 mL/min (ref >60)
GLUCOSE: 114 mg/dL — AB (ref 65–99)
Potassium: 3.6 mmol/L (ref 3.5–5.1)
Sodium: 137 mmol/L (ref 135–145)

## 2017-06-18 NOTE — ED Notes (Signed)
Dr. Quale at the bedside for pt evaluation.  

## 2017-06-18 NOTE — Patient Outreach (Signed)
[facility]) Care Management  06/18/2017  Rebecca Watkins 26-Dec-1955 [id number]   Subjective: Telephone call to patient's home / mobile number, spoke with patient, and HIPAA verified.  Discussed [facility] Care Management [facility]MR Transition of care follow up, patient voiced understanding, and is in agreement to follow up.   Patient remembers speaking with this RNCM in the past.  States she is doing well, husband assisting with activities of daily living as needed, is attending outpatient therapy at a [facility] facility, therapy is going well, and she has a follow up appointment scheduled with surgeon on 06/25/17.  Patient voices understanding of medical diagnosis, surgery, and treatment plan. [facility] benefits discussed on 06/13/17 preoperative call and patient states no additional questions at this time. Patient states she does not have any education material, transition of care, care coordination, disease management, disease monitoring, transportation, community resource, or pharmacy needs at this time. States she is very appreciative of the follow up and is in agreement to receive [place] Management information.    Objective: Per KPN (Knowledge Performance Now, point of care tool) and chart review, patient hospitalized 06/13/17 -06/16/17 for Unilateral primary osteoarthritis, left knee.   Status post Left TOTAL KNEE ARTHROPLASTY on 06/13/17.     Assessment: Received [facility] Preoperative / Transition of care referral on 05/30/17. Preoperative call completed.  Transition of care follow up completed, no care management needs, and will proceed with case closure.     Plan: RNCM will send patient successful outreach letter, [facility] pamphlet, and magnet. RNCM will send case closure due to follow up completed / no care management needs request to [facility].     Breylan Lefevers H. Annia Friendly, BSN, [place] Management [facility] Telephonic CM Phone: [phone] Fax:  [phone]

## 2017-06-18 NOTE — Therapy (Signed)
[place] [facility] [address] [place], [address] Phone: [phone]   Fax:  [phone]  Physical Therapy Evaluation  Patient Details  Name: Rebecca Watkins MRN: [id number] Date of Birth: [birth year] Referring Provider: Thornton Park  Encounter Date: 06/18/2017      PT End of Session - 06/18/17 1137    Visit Number 1   Number of Visits 13   Date for PT Re-Evaluation 07/30/17   PT Start Time 8250   PT Stop Time 1115   PT Time Calculation (min) 60 min   Activity Tolerance Patient tolerated treatment well   Behavior During Therapy [facility] for tasks assessed/performed      Past Medical History:  Diagnosis Date  . Arthritis    Osteoarthritis  . Breast discharge 04/2017   when expressed  . Diverticulitis 2017  . PONV (postoperative nausea and vomiting)   . Temporary low platelet count (HCC)    H/O    Past Surgical History:  Procedure Laterality Date  . COLONOSCOPY WITH PROPOFOL N/A 12/15/2015   Procedure: COLONOSCOPY WITH PROPOFOL;  Surgeon: Manya Silvas, MD;  Location[facility] ENDOSCOPY;  Service: Endoscopy;  Laterality: N/A;  . KNEE ARTHROSCOPY WITH MEDIAL MENISECTOMY Left 10/31/2016   Procedure: KNEE ARTHROSCOPY WITH MEDIAL MENISECTOMY;  Surgeon: Thornton Park, MD;  Location: [facility] ORS;  Service: Orthopedics;  Laterality: Left;  . TOTAL KNEE ARTHROPLASTY Left 06/13/2017   Procedure: TOTAL KNEE ARTHROPLASTY;  Surgeon: Thornton Park, MD;  Location: [facility] ORS;  Service: Orthopedics;  Laterality: Left;  . TUBAL LIGATION      There were no vitals filed for this visit.       Subjective Assessment - 06/18/17 1025    Subjective Patient is status post TKA performed 06/13/17. Patient reports difficulty with performing the exercises given through a sheet from her doctor, sleeping (is a side sleeper but has been sleeping on her back secondary to surgery), walking (needs to walk for a certain period of time  before it feels comfortable), Straightening out the leg in long sitting, and standing for long period of time. Patient has been taking  tylenol and tramadol.    Pertinent History PMH: diverticulitis   Limitations Standing   How long can you stand comfortably? 90min   Diagnostic tests X-Ray   Patient Stated Goals Return to dialy activities; dance    Currently in Pain? Yes   Pain Score 3   worst  8/10; best; 3/10   Pain Location Knee   Pain Orientation Left   Pain Descriptors / Indicators Aching   Pain Type Surgical pain   Pain Onset In the past 7 days   Pain Frequency Constant            [facility] PT Assessment - 06/18/17 1021      Assessment   Medical Diagnosis L TKA   Referring Provider Thornton Park   Onset Date/Surgical Date 06/13/17   Prior Therapy 06/25/17     Restrictions   Weight Bearing Restrictions No     Home Environment   Living Environment Private residence   Living Arrangements Spouse/significant other   Available Help at Discharge Family   Type of [place] to enter   Entrance Stairs-Number of Steps [place] One level   [place] - 2 wheels     Prior Function   Level of Independence Independent   Vocation Full time  employment     Observation/Other Assessments   Observations Increased pain along the L calf, patient has increased pain with plantarflexion but none with dorsiflexion, brusing throughout the leg and knee. Pain seems to be musculoskeletal in nature   Skin Integrity Increased bleeding coming from incision site.      Sensation   Light Touch Appears Intact  Decreased sensation along the lateral aspect of lower leg     Functional Tests   Functional tests Sit to Stand     Sit to Stand   Comments requires use of UE's to perform; little to no weight bearing onto the affected LE with performance.      Posture/Postural Control   Posture Comments Increased forward shoulders,  increased cervical forward head posture     ROM / Strength   AROM / PROM / Strength AROM;Strength     AROM   AROM Assessment Site Hip;Knee   Right/Left Hip Right;Left   Right Hip Flexion 120   Right Hip ABduction 40   Right Hip ADduction 20   Left Hip Flexion 100   Left Hip ABduction 30   Left Hip ADduction 20   Right/Left Knee Left;Right   Right Knee Extension 0   Right Knee Flexion 120   Left Knee Extension 10   Left Knee Flexion 80     Strength   Strength Assessment Site Hip;Knee   Right/Left Hip Right;Left   Right Hip Flexion 4+/5   Right Hip ABduction 5/5   Right Hip ADduction 4/5   Left Hip Flexion 3-/5   Left Hip ABduction 4-/5   Left Hip ADduction 4-/5   Right/Left Knee Right;Left   Right Knee Flexion 5/5   Right Knee Extension 5/5   Left Knee Flexion 3/5   Left Knee Extension 3/5     Palpation   Palpation comment Increased soreness and tenderness to palpation along quadriceps and hamstring      Transfers   Comments Not assessed secondary to increased knee pain      Ambulation/Gait   Assistive device Rolling walker   Gait Comments Decreased weight acceptance onto the affected side, decreased stride length B     Balance   Balance Assessed --  unable to perform single leg balance on the affected side     Objective measurements completed on examination: See above findings.    TREATMENT:  Therapeutic Exercise: Seated knee flexion with ball under foot - x 20  Mini Squats with UE - x 20 Weight shifts in standing - x 20  Seated ankles pumps in sitting - x 20  Quad sets in long sitting - x 20       PT Education - 06/18/17 1134    Education provided Yes   Education Details HEP: knee flex/ext; mini squats in standing, weight shifts in standing, quad sets,    Person(s) Educated Patient   Methods Explanation;Demonstration   Comprehension Verbalized understanding;Returned demonstration             PT Long Term Goals - 06/18/17 1254      PT LONG  TERM GOAL #1   Title Patient will be independent with HEP focused on improving limitations to return to prior level of function.    Baseline Dependent with form/technique   Time 6   Period Weeks   Target Date 07/16/17     PT LONG TERM GOAL #2   Title Patient will improve 27mWT to >1.0 m/s without use of AD to demonstrate significant improvement  in function   Baseline .3 m/s with FWW   Time 6   Period Weeks   Status New   Target Date 07/30/17     PT LONG TERM GOAL #3   Title Pt will demonstrate knee AROM of extension: 0 and flexion >120deg to demonstrate greater ability to walk without increase in pain   Baseline extension 10 (from neutral); flexion: 80deg   Time 6   Period Weeks   Status New   Target Date 07/30/17     PT LONG TERM GOAL #4   Title Patient will be able to stand for over 2 hours without AD to allow for 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 Patient demonstrates increased knee dysfunction as indicated by difficulty with walking, stepping with the affected side, and weight bearing onto the affected side. Patient demonstrates increased resting pain and poor coordination with performance of quadriceps activation. Patient also demonstrates decreased strength, endurance, and coordination; patient will benefit from further skilled therapy to return to prior level of function.    History and Personal Factors relevant to plan of care: Patient had chronic knee pain before surgery   Clinical Presentation Stable   Clinical Presentation due to: Pain remaining stable after surgery   Clinical Decision Making Low   Rehab Potential Good   Clinical Impairments Affecting Rehab Potential (+) highly motivated, family support  (-) Previous knee pain   PT Frequency 2x / week   PT Duration 6 weeks   PT Treatment/Interventions Dry needling;Taping;Passive range of motion;Manual techniques;Neuromuscular re-education;Gait training;Stair training;Therapeutic activities;Therapeutic exercise;Balance training;Patient/family education;Moist Heat;Ultrasound;Electrical Stimulation;Cryotherapy;Aquatic Therapy;Iontophoresis 4mg /ml Dexamethasone   PT Next Visit Plan Progress strengthening and AROM   PT Home Exercise Plan See education section   Consulted and Agree with Plan of Care Patient      Patient will benefit from skilled therapeutic intervention in order to improve the following deficits and impairments:  Abnormal gait, Increased fascial restricitons, Pain, Increased muscle spasms, Decreased coordination, Decreased endurance, Decreased range of motion, Decreased strength, Decreased balance  Visit Diagnosis: Left knee pain, unspecified chronicity - Plan: PT plan of care cert/re-cert  Other lack of coordination - Plan: PT plan of care cert/re-cert  Stiffness of left knee, not elsewhere classified - Plan: PT plan of care cert/re-cert     Problem List Patient Active Problem List   Diagnosis Date Noted  . S/P total knee arthroplasty, left 06/13/2017  . Bruising 03/25/2017  . Pre-op evaluation 10/25/2016  . Bilateral knee pain 11/01/2015  . Thrombocytopenia ([place]) 11/01/2015  . AP (abdominal pain)   . Diverticulitis large intestine 10/01/2015    Blythe Stanford, PT DPT 06/18/2017, 2:44 PM  [place] [facility] [address], [place], [address] Phone: [phone]   Fax:  [phone]  Name: Rebecca Watkins MRN: [id number] Date of Birth: 04-Oct-1955

## 2017-06-18 NOTE — ED Triage Notes (Addendum)
Pt to triage via w/c with no distress noted; reports left TKR performed 10/26 by Dr Mack Guise, d/c Monday; c/o pain/swelling to calf since last night; currently taking lovenox; dressing D&I, swelling/bruising noted to left left

## 2017-06-18 NOTE — ED Provider Notes (Signed)
[facility] Emergency Department Provider Note   ____________________________________________   First MD Initiated Contact with Patient 06/18/17 2337     (approximate)  I have reviewed the triage vital signs and the nursing notes.   HISTORY  Chief Complaint Leg Pain    HPI Rebecca Watkins is a 61 y.o. female recent left knee replacement  Today patient noticed increased swelling around the upper part of the calf on the left leg.  She has had bruising since the time of surgery, denies any numbness weakness or persistent pain.  At rest reports minimal discomfort "as expected" after surgery.  She is also noticed that the lower part of her surgical incision has had a small amount of pink drainage and slight amount of oozing of clear to pink fluid.  She was able to attend physical therapy today, they asked her to watch and if increased swelling have evaluated.  She is concerned and would like to make sure she has not developed a blood clot.  She is on Lovenox once daily which she started.  No fevers or chills.  No nausea or vomiting.  No chest pain or trouble breathing.    Past Medical History:  Diagnosis Date  . Arthritis    Osteoarthritis  . Breast discharge 04/2017   when expressed  . Diverticulitis 2017  . PONV (postoperative nausea and vomiting)   . Temporary low platelet count ([place])    H/O    Patient Active Problem List   Diagnosis Date Noted  . S/P total knee arthroplasty, left 06/13/2017  . Bruising 03/25/2017  . Pre-op evaluation 10/25/2016  . Bilateral knee pain 11/01/2015  . Thrombocytopenia ([place]) 11/01/2015  . AP (abdominal pain)   . Diverticulitis large intestine 10/01/2015    Past Surgical History:  Procedure Laterality Date  . COLONOSCOPY WITH PROPOFOL N/A 12/15/2015   Procedure: COLONOSCOPY WITH PROPOFOL;  Surgeon: Manya Silvas, MD;  Location: [facility] ENDOSCOPY;  Service: Endoscopy;  Laterality: N/A;  . KNEE ARTHROSCOPY WITH  MEDIAL MENISECTOMY Left 10/31/2016   Procedure: KNEE ARTHROSCOPY WITH MEDIAL MENISECTOMY;  Surgeon: Thornton Park, MD;  Location: [facility] ORS;  Service: Orthopedics;  Laterality: Left;  . TOTAL KNEE ARTHROPLASTY Left 06/13/2017   Procedure: TOTAL KNEE ARTHROPLASTY;  Surgeon: Thornton Park, MD;  Location: [facility] ORS;  Service: Orthopedics;  Laterality: Left;  . TUBAL LIGATION      Prior to Admission medications   Medication Sig Start Date End Date Taking? Authorizing Provider  enoxaparin (LOVENOX) 30 MG/0.3ML injection Inject 0.4 mLs (40 mg total) into the skin daily. 06/16/17   Thornton Park, MD  PREMPRO 0.45-1.5 MG tablet Take 1 tablet by mouth daily.  09/07/15   [provider]  traMADol (ULTRAM) 50 MG tablet Take 1 tablet (50 mg total) by mouth every 6 (six) hours as needed for moderate pain. 06/16/17   Thornton Park, MD    Allergies Dilaudid [hydromorphone hcl]; Ciprofloxacin; Erythromycin; and Metronidazole  Family History  Problem Relation Age of Onset  . Hyperlipidemia Mother   . Hypertension Mother   . Breast cancer Sister 25  . Breast cancer Maternal Aunt 70  . Hyperlipidemia Father   . Hypertension Father   . Esophageal cancer Father   . Diabetes Mellitus I Daughter   . Breast cancer Daughter 45    Social History Social History  Substance Use Topics  . Smoking status: Never Smoker  . Smokeless tobacco: Never Used  . Alcohol use No    Review of Systems  Constitutional: No fever/chills Eyes: No visual changes. ENT: No sore throat. Cardiovascular: Denies chest pain. Respiratory: Denies shortness of breath. Gastrointestinal: No abdominal pain.  Musculoskeletal: See HPI  skin: Negative for rash except as bruising as noted in HPI Neurological: Negative for weakness or numbness.    ____________________________________________   PHYSICAL EXAM:  VITAL SIGNS: ED Triage Vitals  Enc Vitals Group     BP 06/18/17 2230 (!) 148/73     Pulse Rate  06/18/17 2230 90     Resp 06/18/17 2230 18     Temp 06/18/17 2230 98.3 F (36.8 C)     Temp Source 06/18/17 2230 Oral     SpO2 06/18/17 2230 100 %     Weight 06/18/17 2227 146 lb (66.2 kg)     Watkins 06/18/17 2227 5\' 7"  (1.702 m)     Head Circumference --      Peak Flow --      Pain Score 06/18/17 2230 7     Pain Loc --      Pain Edu? --      Excl. in [place]? --     Constitutional: Alert and oriented. Well appearing and in no acute distress.  The patient and her husband both very pleasant Eyes: Conjunctivae are normal. Head: Atraumatic. Nose: No congestion/rhinnorhea. Mouth/Throat: Mucous membranes are moist. Neck: No stridor.   Cardiovascular: Normal rate, regular rhythm. Grossly normal heart sounds.  Good peripheral circulation. Respiratory: Normal respiratory effort.  No retractions. Lungs CTAB. Gastrointestinal: Soft and nontender.  Musculoskeletal:   Lower Extremities  No edema. Normal DP/PT pulses bilateral with good cap refill. Doppler affirms all pulses lower extremities.  Normal neuro-motor function lower extremities bilateral.  LEFT Left lower extremity demonstrates good use of all muscles. No edema bruising or contusions of the hip,   ankle. Full range of motion of the left lower extremity without pain except testing of the left knee was limited due to recent surgical procedure. No pain on axial loading.  Left knee anterior incision clean dry intact of the upper two thirds, in the lower one third demonstrates slight serous drainage with a slight pink tinge without any purulence or surrounding erythema.  There is notable bruising and edema about the anterior knee and also over the proximal upper leg also involving the popliteal fossa.  Minimal tenderness.  Compartments appear to be soft throughout.    Neurologic:  Normal speech and language. No gross focal neurologic deficits are appreciated.  Skin:  Skin is warm, dry and intact. No rash noted. Psychiatric: Mood and  affect are normal. Speech and behavior are normal.  ____________________________________________   LABS (all labs ordered are listed, but only abnormal results are displayed)  Labs Reviewed  CBC - Abnormal; Notable for the following:       Result Value   RBC 2.95 (*)    Hemoglobin 9.3 (*)    HCT 27.6 (*)    All other components within normal limits  BASIC METABOLIC PANEL - Abnormal; Notable for the following:    Glucose, Bld 114 (*)    All other components within normal limits   ____________________________________________  EKG   ____________________________________________  RADIOLOGY  US Venous Img Lower Unilateral Left  Result Date: 06/19/2017 CLINICAL DATA:  Lower extremity swelling, left EXAM: LEFT LOWER EXTREMITY VENOUS DOPPLER ULTRASOUND TECHNIQUE: Gray-scale sonography with graded compression, as well as color Doppler and duplex ultrasound were performed to evaluate the lower extremity deep venous systems from the level of the common femoral  vein and including the common femoral, femoral, profunda femoral, popliteal and calf veins including the posterior tibial, peroneal and gastrocnemius veins when visible. The superficial great saphenous vein was also interrogated. Spectral Doppler was utilized to evaluate flow at rest and with distal augmentation maneuvers in the common femoral, femoral and popliteal veins. COMPARISON:  None. FINDINGS: Contralateral Common Femoral Vein: Respiratory phasicity is normal and symmetric with the symptomatic side. No evidence of thrombus. Normal compressibility. Common Femoral Vein: No evidence of thrombus. Normal compressibility, respiratory phasicity and response to augmentation. Saphenofemoral Junction: No evidence of thrombus. Normal compressibility and flow on color Doppler imaging. Profunda Femoral Vein: No evidence of thrombus. Normal compressibility and flow on color Doppler imaging. Femoral Vein: No evidence of thrombus. Normal  compressibility, respiratory phasicity and response to augmentation. Popliteal Vein: No evidence of thrombus. Normal compressibility, respiratory phasicity and response to augmentation. Calf Veins: No evidence of thrombus. Normal compressibility and flow on color Doppler imaging. Superficial Great Saphenous Vein: No evidence of thrombus. Normal compressibility. Venous Reflux:  None. Other Findings:  None. IMPRESSION: No evidence of deep venous thrombosis. Electronically Signed   By: Ulyses Jarred M.D.   On: 06/19/2017 01:05   Ultrasound reviewed, no DVT ____________________________________________   PROCEDURES  Procedure(s) performed: None  Procedures  Critical Care performed: No  ____________________________________________   INITIAL IMPRESSION / ASSESSMENT AND PLAN / ED COURSE  Pertinent labs & imaging results that were available during my care of the patient were reviewed by me and considered in my medical decision making (see chart for details).  Swelling and discomfort around the popliteal fossa and proximal calf after knee replacement.  Overall reassuring examination, afebrile without infectious symptoms.  Blood work is reassuring with increased platelet count, hemoglobin at baseline, and no elevation of the white count arguing against infection or large hematoma formation.  She is distally neurologically intact with normal vascular exam in the left lower extremity.  Plan to obtain venous ultrasound to exclude DVT, though patient is currently on Lovenox.  Discussed case with Dr. Mack Guise covering for [facility], advises no recommendations for distal testing in the ER.  Recommends careful return precautions and follow-up in his clinic Friday this week.  Discussed with the patient, she is agreeable and will set up follow-up with Dr. Mack Guise for Friday  Return precautions and treatment recommendations and follow-up discussed with the patient who is agreeable with the plan.        ____________________________________________   FINAL CLINICAL IMPRESSION(S) / ED DIAGNOSES  Final diagnoses:  Left leg swelling  Post-operative state      NEW MEDICATIONS STARTED DURING THIS VISIT:  New Prescriptions   No medications on file     Note:  This document was prepared using Dragon voice recognition software and may include unintentional dictation errors.     Delman Kitten, MD 06/19/17 [phone]

## 2017-06-19 DIAGNOSIS — M7989 Other specified soft tissue disorders: Secondary | ICD-10-CM | POA: Diagnosis not present

## 2017-06-19 NOTE — ED Notes (Signed)
Pt assisted to restroom with walker. Gait steady.

## 2017-06-19 NOTE — Discharge Instructions (Signed)
Return to the Er if you notice: You have more redness, swelling, or pain around your incision or drain. You have more fluid or blood coming from your incision or drain. Your incision or drain site feels warm to the touch. You have pus or a bad smell coming from your incision or drain. You have a fever. Your incision breaks open after your health care provider removes your sutures, skin glue, or adhesive tape. Your prosthesis feels loose. You have knee pain that does not go away.

## 2017-06-19 NOTE — ED Notes (Signed)
Pt to ultrasound

## 2017-06-19 NOTE — ED Notes (Signed)
Pt returned from ultrasound

## 2017-06-24 ENCOUNTER — Ambulatory Visit: Payer: 59 | Attending: Orthopedic Surgery

## 2017-06-24 DIAGNOSIS — R2689 Other abnormalities of gait and mobility: Secondary | ICD-10-CM | POA: Diagnosis not present

## 2017-06-24 DIAGNOSIS — M25562 Pain in left knee: Secondary | ICD-10-CM | POA: Insufficient documentation

## 2017-06-24 DIAGNOSIS — M25662 Stiffness of left knee, not elsewhere classified: Secondary | ICD-10-CM | POA: Insufficient documentation

## 2017-06-24 DIAGNOSIS — R278 Other lack of coordination: Secondary | ICD-10-CM | POA: Insufficient documentation

## 2017-06-24 DIAGNOSIS — Z96652 Presence of left artificial knee joint: Secondary | ICD-10-CM | POA: Diagnosis not present

## 2017-06-24 NOTE — Therapy (Signed)
[place] [facility] [address] [place], [address] Phone: [phone]   Fax:  [phone]  Physical Therapy Treatment  Patient Details  Name: Rebecca Watkins MRN: [id number] Date of Birth: 04/17/1956 Referring Provider: Thornton Park   Encounter Date: 06/24/2017  PT End of Session - 06/24/17 1356    Visit Number  2    Number of Visits  13    Date for PT Re-Evaluation  07/30/17    PT Start Time  1300    PT Stop Time  1345    PT Time Calculation (min)  45 min    Activity Tolerance  Patient tolerated treatment well    Behavior During Therapy  [facility] for tasks assessed/performed       Past Medical History:  Diagnosis Date  . Arthritis    Osteoarthritis  . Breast discharge 04/2017   when expressed  . Diverticulitis 2017  . PONV (postoperative nausea and vomiting)   . Temporary low platelet count ([facility])    H/O    Past Surgical History:  Procedure Laterality Date  . TUBAL LIGATION      There were no vitals filed for this visit.  Subjective Assessment - 06/24/17 1353    Subjective  Patient reports she's been performing her exercises three times per day at home. Patient is worried about her progress post surgery. Patient states her exercises are becoming easier however.     Pertinent History  PMH: diverticulitis    Limitations  Standing    How long can you stand comfortably?  69min    Diagnostic tests  X-Ray    Patient Stated Goals  Return to dialy activities; dance     Currently in Pain?  Yes    Pain Score  4     Pain Location  Knee    Pain Orientation  Left    Pain Descriptors / Indicators  Aching    Pain Type  Surgical pain    Pain Onset  1 to 4 weeks ago    Pain Frequency  Constant       TREATMENT  Manual Therapy: STM to patient's calf in sitting to decrease increased spasms and pain along her calf utilizing superficial and deep techniques to perform.   Therapeutic Exercise: Squats in  standing - with UE support  Feet together balance - x45sec without UE support Weight Shifts in tandem stance - x 20 without UE support Feet together balance - in standing x 30 Calf stretch in standing - x20 2 sec  Step ups onto 3" step - 2 x 10 on the L LE SLR in sitting - 2 x 10 Ball roll outs - 2 x 1.5 min in sitting  Patient demonstrates increased fatigue at end of session.    PT Education - 06/24/17 1355    Education provided  Yes    Education Details  HEP: SLR, calf stretch in standing    Person(s) Educated  Patient    Methods  Explanation;Demonstration;Handout    Comprehension  Returned demonstration;Verbalized understanding          PT Long Term Goals - 06/18/17 1254      PT LONG TERM GOAL #1   Title  Patient will be independent with HEP focused on improving limitations to return to prior level of function.     Baseline  Dependent with form/technique    Time  6    Period  Weeks    Target Date  07/16/17      PT LONG TERM GOAL #2   Title  Patient will improve 89mWT to >1.0 m/s without use of AD to demonstrate significant improvement in function    Baseline  .3 m/s with FWW    Time  6    Period  Weeks    Status  New    Target Date  07/30/17      PT LONG TERM GOAL #3   Title  Pt will demonstrate knee AROM of extension: 0 and flexion >120deg to demonstrate greater ability to walk without increase in pain    Baseline  extension 10 (from neutral); flexion: 80deg    Time  6    Period  Weeks    Status  New    Target Date  07/30/17      PT LONG TERM GOAL #4   Title  Patient will be able to stand for over 2 hours without AD 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 Patient demonstrates  ability to perform SLR and voluntarily activate her quadriceps muscular during the activity. Patient continues to demonstrate increased calf pain, but went to ED since previous visit and ruled out DVT. Patient demonstrates less pain at end of session after STM indicating improvement in symptoms. Patient will benefit from further skilled therapy to return to prior level of function.     Rehab Potential  Good    Clinical Impairments Affecting Rehab Potential  (+) highly motivated, family support (-) Previous knee pain    PT Frequency  2x / week    PT Duration  6 weeks    PT Treatment/Interventions  Dry needling;Taping;Passive range of motion;Manual techniques;Neuromuscular re-education;Gait training;Stair training;Therapeutic activities;Therapeutic exercise;Balance training;Patient/family education;Moist Heat;Ultrasound;Electrical Stimulation;Cryotherapy;Aquatic Therapy;Iontophoresis 4mg /ml Dexamethasone    PT Next Visit Plan  Progress strengthening and AROM    PT Home Exercise Plan  See education section    Consulted and Agree with Plan of Care  Patient       Patient will benefit from skilled therapeutic intervention in order to improve the following deficits and impairments:  Abnormal gait, Increased fascial restricitons, Pain, Increased muscle spasms, Decreased coordination, Decreased endurance, Decreased range of motion, Decreased strength, Decreased balance  Visit Diagnosis: Left knee pain, unspecified chronicity  Other lack of coordination  Stiffness of left knee, not elsewhere classified     Problem List Patient Active Problem List   Diagnosis Date Noted  . S/P total knee arthroplasty, left 06/13/2017  . Bruising 03/25/2017  . Pre-op evaluation 10/25/2016  . Bilateral knee pain 11/01/2015  . Thrombocytopenia ([place]) 11/01/2015  . AP (abdominal pain)   . Diverticulitis large intestine 10/01/2015    Blythe Stanford, PT DPT 06/24/2017, 2:00 PM  [place] [facility] [address], [place], [address] Phone: [phone]   Fax:  [phone]  Name: Rebecca Watkins MRN: [id number] Date of Birth: 1956-07-21

## 2017-06-25 DIAGNOSIS — S83232A Complex tear of medial meniscus, current injury, left knee, initial encounter: Secondary | ICD-10-CM | POA: Diagnosis not present

## 2017-06-25 DIAGNOSIS — M1712 Unilateral primary osteoarthritis, left knee: Secondary | ICD-10-CM | POA: Diagnosis not present

## 2017-06-26 ENCOUNTER — Ambulatory Visit: Payer: 59

## 2017-06-26 DIAGNOSIS — R278 Other lack of coordination: Secondary | ICD-10-CM | POA: Diagnosis not present

## 2017-06-26 DIAGNOSIS — Z96652 Presence of left artificial knee joint: Secondary | ICD-10-CM | POA: Diagnosis not present

## 2017-06-26 DIAGNOSIS — M25562 Pain in left knee: Secondary | ICD-10-CM | POA: Diagnosis not present

## 2017-06-26 DIAGNOSIS — M25662 Stiffness of left knee, not elsewhere classified: Secondary | ICD-10-CM | POA: Diagnosis not present

## 2017-06-26 DIAGNOSIS — R2689 Other abnormalities of gait and mobility: Secondary | ICD-10-CM | POA: Diagnosis not present

## 2017-06-26 NOTE — Therapy (Signed)
[place] [facility] [address] [place], [address] Phone: [phone]   Fax:  [phone]  Physical Therapy Treatment  Patient Details  Name: Rebecca Watkins MRN: [id number] Date of Birth: 11-29-1955 Referring Provider: Thornton Park   Encounter Date: 06/26/2017  PT End of Session - 06/26/17 1403    Visit Number  3    Number of Visits  13    Date for PT Re-Evaluation  07/30/17    PT Start Time  1330    PT Stop Time  1415    PT Time Calculation (min)  45 min    Activity Tolerance  Patient tolerated treatment well    Behavior During Therapy  [facility] for tasks assessed/performed       Past Medical History:  Diagnosis Date  . Arthritis    Osteoarthritis  . Breast discharge 04/2017   when expressed  . Diverticulitis 2017  . PONV (postoperative nausea and vomiting)   . Temporary low platelet count ([facility])    H/O    Past Surgical History:  Procedure Laterality Date  . TUBAL LIGATION      There were no vitals filed for this visit.  Subjective Assessment - 06/26/17 1357    Subjective  Patient reports she's able to walk better without the walker as she had in the past few weeks. Patient reports her knee is feeling better.     Pertinent History  PMH: diverticulitis    Limitations  Standing    How long can you stand comfortably?  23min    Diagnostic tests  X-Ray    Patient Stated Goals  Return to dialy activities; dance     Currently in Pain?  Yes    Pain Score  3     Pain Location  Knee    Pain Orientation  Left    Pain Descriptors / Indicators  Aching    Pain Type  Surgical pain    Pain Onset  1 to 4 weeks ago    Pain Frequency  Constant       TREATMENT   Manual Therapy: STM to patient's calf in sitting to decrease increased spasms and pain along her calf utilizing superficial and deep techniques to perform.    Therapeutic Exercise: Step ups onto 5" step - 2 x 10 on the L LE Side stepping across  airex beam - x 7 down and back Weight Shifts in tandem stance - x 20 without UE support Leg Press - x 25 B LE 25# Heel raises in standing - x 25 with UE support  Ambulation with focus on improving heel strike - x 179ft     Patient demonstrates increased fatigue at end of session.     PT Education - 06/26/17 1403    Education provided  Yes    Education Details  Educated to [place] off use of FWW at home    Person(s) Educated  Patient    Methods  Explanation;Demonstration    Comprehension  Verbalized understanding;Returned demonstration          PT Long Term Goals - 06/18/17 1254      PT LONG TERM GOAL #1   Title  Patient will be independent with HEP focused on improving limitations to return to prior level of function.     Baseline  Dependent with form/technique    Time  6    Period  Weeks    Target Date  07/16/17  PT LONG TERM GOAL #2   Title  Patient will improve 60mWT to >1.0 m/s without use of AD to demonstrate significant improvement in function    Baseline  .3 m/s with FWW    Time  6    Period  Weeks    Status  New    Target Date  07/30/17      PT LONG TERM GOAL #3   Title  Pt will demonstrate knee AROM of extension: 0 and flexion >120deg to demonstrate greater ability to walk without increase in pain    Baseline  extension 10 (from neutral); flexion: 80deg    Time  6    Period  Weeks    Status  New    Target Date  07/30/17      PT LONG TERM GOAL #4   Title  Patient will be able to stand for over 2 hours without AD 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 Patient demonstrates decreased muscular endurance  requiring 1 sitting rest break after 20 min of standing exercises and will benefit from further skilled therapy to return to prior level of function.     Rehab Potential  Good    Clinical Impairments Affecting Rehab Potential  (+) highly motivated, family support (-) Previous knee pain    PT Frequency  2x / week    PT Duration  6 weeks    PT Treatment/Interventions  Dry needling;Taping;Passive range of motion;Manual techniques;Neuromuscular re-education;Gait training;Stair training;Therapeutic activities;Therapeutic exercise;Balance training;Patient/family education;Moist Heat;Ultrasound;Electrical Stimulation;Cryotherapy;Aquatic Therapy;Iontophoresis 4mg /ml Dexamethasone    PT Next Visit Plan  Progress strengthening and AROM    PT Home Exercise Plan  See education section    Consulted and Agree with Plan of Care  Patient       Patient will benefit from skilled therapeutic intervention in order to improve the following deficits and impairments:  Abnormal gait, Increased fascial restricitons, Pain, Increased muscle spasms, Decreased coordination, Decreased endurance, Decreased range of motion, Decreased strength, Decreased balance  Visit Diagnosis: Left knee pain, unspecified chronicity  Other lack of coordination  Stiffness of left knee, not elsewhere classified     Problem List Patient Active Problem List   Diagnosis Date Noted  . S/P total knee arthroplasty, left 06/13/2017  . Bruising 03/25/2017  . Pre-op evaluation 10/25/2016  . Bilateral knee pain 11/01/2015  . Thrombocytopenia ([place]) 11/01/2015  . AP (abdominal pain)   . Diverticulitis large intestine 10/01/2015    Blythe Stanford, PT DPT 06/26/2017, 3:58 PM  [facility] [address] [place], [address] Phone: [phone]   Fax:  [phone]  Name: Rebecca Watkins MRN: [id number] Date of Birth: 04-May-1956

## 2017-07-01 ENCOUNTER — Ambulatory Visit: Payer: 59

## 2017-07-01 DIAGNOSIS — M25662 Stiffness of left knee, not elsewhere classified: Secondary | ICD-10-CM | POA: Diagnosis not present

## 2017-07-01 DIAGNOSIS — R2689 Other abnormalities of gait and mobility: Secondary | ICD-10-CM | POA: Diagnosis not present

## 2017-07-01 DIAGNOSIS — M25562 Pain in left knee: Secondary | ICD-10-CM | POA: Diagnosis not present

## 2017-07-01 DIAGNOSIS — Z96652 Presence of left artificial knee joint: Secondary | ICD-10-CM | POA: Diagnosis not present

## 2017-07-01 DIAGNOSIS — R278 Other lack of coordination: Secondary | ICD-10-CM | POA: Diagnosis not present

## 2017-07-01 NOTE — Therapy (Signed)
[place] [facility] [address], [place], [address] Phone: [phone]   Fax:  [phone]  Physical Therapy Treatment  Patient Details  Name: Rebecca Watkins MRN: [id number] Date of Birth: 06/06/1956 Referring Provider: Thornton Park   Encounter Date: 07/01/2017  PT End of Session - 07/01/17 1309    Visit Number  4    Number of Visits  13    Date for PT Re-Evaluation  07/30/17    PT Start Time  1302    PT Stop Time  1345    PT Time Calculation (min)  43 min    Activity Tolerance  Patient tolerated treatment well    Behavior During Therapy  [facility] for tasks assessed/performed       Past Medical History:  Diagnosis Date  . Arthritis    Osteoarthritis  . Breast discharge 04/2017   when expressed  . Diverticulitis 2017  . PONV (postoperative nausea and vomiting)   . Temporary low platelet count (HCC)    H/O    Past Surgical History:  Procedure Laterality Date  . TUBAL LIGATION      There were no vitals filed for this visit.  Subjective Assessment - 07/01/17 1305    Subjective  Patient reports increased stiffness and pain today when moving her knee. Patient reports no major changes since the weekend.     Pertinent History  PMH: diverticulitis    Limitations  Standing    How long can you stand comfortably?  19min    Diagnostic tests  X-Ray    Patient Stated Goals  Return to dialy activities; dance     Currently in Pain?  Yes    Pain Score  6     Pain Location  Knee    Pain Orientation  Left    Pain Descriptors / Indicators  Aching    Pain Onset  1 to 4 weeks ago    Pain Frequency  Constant         Manual Therapy: STM to patient's L patella with patient in long sitting to improve mobility to increase mobility mobilizations grade III 3 x 30sec. STM to patient's anterior thigh over quadriceps musculature.    Therapeutic Exercise: Lunges in standing - x 10 B ; Lunges onto Bosu ball - x 10 with  UE support  Squats in standing - 3 x 10 without UE support for last set of 10 SLS without UE support - x 10 with 10 sec holds B  Quad sets in long sitting - 2 x 10 5 sec holds Heel walking without UE support - x 7 (2ft each lap) Step ups onto 7" step - x 10 on B LE Seated Nustep for strength and decreasing pain - 68min level 3     Patient demonstrates increased fatigue at end of session.     PT Education - 07/01/17 1307    Education provided  Yes    Education Details  Educated to continue HEP.     Person(s) Educated  Patient    Methods  Explanation;Demonstration    Comprehension  Verbalized understanding;Returned demonstration          PT Long Term Goals - 06/18/17 1254      PT LONG TERM GOAL #1   Title  Patient will be independent with HEP focused on improving limitations to return to prior level of function.     Baseline  Dependent with form/technique    Time  6  Period  Weeks    Target Date  07/16/17      PT LONG TERM GOAL #2   Title  Patient will improve 31mWT to >1.0 m/s without use of AD to demonstrate significant improvement in function    Baseline  .3 m/s with FWW    Time  6    Period  Weeks    Status  New    Target Date  07/30/17      PT LONG TERM GOAL #3   Title  Pt will demonstrate knee AROM of extension: 0 and flexion >120deg to demonstrate greater ability to walk without increase in pain    Baseline  extension 10 (from neutral); flexion: 80deg    Time  6    Period  Weeks    Status  New    Target Date  07/30/17      PT LONG TERM GOAL #4   Title  Patient will be able to stand for over 2 hours without AD 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 Patient demonstrates improvement in AROM with 10 deg of knee extension and 90 degree of knee flexion indicating functional carryover between treatment sessions. Patient will benefit from further skilled therapy to return to prior level of function.     Rehab Potential  Good    Clinical Impairments Affecting Rehab Potential  (+) highly motivated, family support (-) Previous knee pain    PT Frequency  2x / week    PT Duration  6 weeks    PT Treatment/Interventions  Dry needling;Taping;Passive range of motion;Manual techniques;Neuromuscular re-education;Gait training;Stair training;Therapeutic activities;Therapeutic exercise;Balance training;Patient/family education;Moist Heat;Ultrasound;Electrical Stimulation;Cryotherapy;Aquatic Therapy;Iontophoresis 4mg /ml Dexamethasone    PT Next Visit Plan  Progress strengthening and AROM    PT Home Exercise Plan  See education section    Consulted and Agree with Plan of Care  Patient       Patient will benefit from skilled therapeutic intervention in order to improve the following deficits and impairments:  Abnormal gait, Increased fascial restricitons, Pain, Increased muscle spasms, Decreased coordination, Decreased endurance, Decreased range of motion, Decreased strength, Decreased balance  Visit Diagnosis: Left knee pain, unspecified chronicity  Other lack of coordination  Stiffness of left knee, not elsewhere classified     Problem List Patient Active Problem List   Diagnosis Date Noted  . S/P total knee arthroplasty, left 06/13/2017  . Bruising 03/25/2017  . Pre-op evaluation 10/25/2016  . Bilateral knee pain 11/01/2015  . Thrombocytopenia ([place]) 11/01/2015  . AP (abdominal pain)   . Diverticulitis large intestine 10/01/2015    Rebecca Watkins, PT DPT 07/01/2017, 1:49 PM  [place] [facility] [address], [place], [address] Phone: [phone]   Fax:   [phone]  Name: Rebecca Watkins MRN: [id number] Date of Birth: November 22, 1955

## 2017-07-03 ENCOUNTER — Ambulatory Visit: Payer: 59

## 2017-07-03 DIAGNOSIS — M25562 Pain in left knee: Secondary | ICD-10-CM | POA: Diagnosis not present

## 2017-07-03 DIAGNOSIS — R278 Other lack of coordination: Secondary | ICD-10-CM | POA: Diagnosis not present

## 2017-07-03 DIAGNOSIS — Z96652 Presence of left artificial knee joint: Secondary | ICD-10-CM | POA: Diagnosis not present

## 2017-07-03 DIAGNOSIS — M25662 Stiffness of left knee, not elsewhere classified: Secondary | ICD-10-CM

## 2017-07-03 DIAGNOSIS — R2689 Other abnormalities of gait and mobility: Secondary | ICD-10-CM | POA: Diagnosis not present

## 2017-07-03 NOTE — Therapy (Signed)
[place] [facility] [address], [place], [address] Phone: [phone]   Fax:  [phone]  Physical Therapy Treatment  Patient Details  Name: Rebecca Watkins MRN: [id number] Date of Birth: [birth year] Referring Provider: Thornton Park   Encounter Date: 07/03/2017  PT End of Session - 07/03/17 1351    Visit Number  5    Number of Visits  13    Date for PT Re-Evaluation  07/30/17    PT Start Time  1300    PT Stop Time  1350    PT Time Calculation (min)  50 min    Activity Tolerance  Patient tolerated treatment well    Behavior During Therapy  [facility] for tasks assessed/performed       Past Medical History:  Diagnosis Date  . Arthritis    Osteoarthritis  . Breast discharge 04/2017   when expressed  . Diverticulitis 2017  . PONV (postoperative nausea and vomiting)   . Temporary low platelet count (HCC)    H/O    Past Surgical History:  Procedure Laterality Date  . COLONOSCOPY WITH PROPOFOL N/A 12/15/2015   Procedure: COLONOSCOPY WITH PROPOFOL;  Surgeon: Manya Silvas, MD;  Location: [facility] ENDOSCOPY;  Service: Endoscopy;  Laterality: N/A;  . KNEE ARTHROSCOPY WITH MEDIAL MENISECTOMY Left 10/31/2016   Procedure: KNEE ARTHROSCOPY WITH MEDIAL MENISECTOMY;  Surgeon: Thornton Park, MD;  Location: [facility] ORS;  Service: Orthopedics;  Laterality: Left;  . TOTAL KNEE ARTHROPLASTY Left 06/13/2017   Procedure: TOTAL KNEE ARTHROPLASTY;  Surgeon: Thornton Park, MD;  Location: [facility] ORS;  Service: Orthopedics;  Laterality: Left;  . TUBAL LIGATION      There were no vitals filed for this visit.  Subjective Assessment - 07/03/17 1307    Subjective  Patient reports her knee is feeling stiff today and had to take pain medication before arriving to therapy. Patient reports increased swelling today.     Pertinent History  PMH: diverticulitis    Limitations  Standing    How long can you stand comfortably?  4min    Diagnostic tests  X-Ray    Patient Stated Goals  Return to dialy activities; dance     Currently in Pain?  Yes    Pain Score  7     Pain Location  Knee    Pain Orientation  Left    Pain Descriptors / Indicators  Aching    Pain Type  Surgical pain    Pain Onset  1 to 4 weeks ago       Manual Therapy: STM to patient's L patella with patient in long sitting to improve mobility to increase mobility mobilizations grade III 3 x 30sec. STM to patient's anterior thigh and posterior knee over quadriceps musculature to decrease increased pain. Effleurage to patients knee in long sitting to decrease increased swelling and edema.    Therapeutic Exercise:  Quad sets with SLR in long sitting - 2 x 5 5 sec holds Seated Nustep for strength and decreasing pain - 72min level 4 Contract relax PROM knee flexion/extension in supine with isometric holds at end of AROM -- x 10 with 5 sec contraction at end range Standing B LE knee flexion/extension TKE with GTB -- x 10; without resistance x20  Patient demonstrates increased fatigue at end of session.    PT Education - 07/03/17 1350    Education provided  Yes    Education Details  form/technique with exercise    Person(s) Educated  Patient    Methods  Explanation;Demonstration    Comprehension  Verbalized understanding;Returned demonstration          PT Long Term Goals - 06/18/17 1254      PT LONG TERM GOAL #1   Title  Patient will be independent with HEP focused on improving limitations to return to prior level of function.     Baseline  Dependent with form/technique    Time  6    Period  Weeks    Target Date  07/16/17      PT LONG TERM GOAL #2   Title  Patient will improve 81mWT to >1.0 m/s without use of AD to demonstrate significant improvement in function    Baseline  .3 m/s with FWW    Time  6    Period  Weeks    Status  New    Target Date  07/30/17      PT LONG TERM GOAL #3   Title  Pt will demonstrate knee AROM of  extension: 0 and flexion >120deg to demonstrate greater ability to walk without increase in pain    Baseline  extension 10 (from neutral); flexion: 80deg    Time  6    Period  Weeks    Status  New    Target Date  07/30/17      PT LONG TERM GOAL #4   Title  Patient will be able to stand 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 Patient demonstrates improvement in AROM compared to initial visit with a knee extension angle of 7 degrees from neutral and 90 degrees of knee flexion. Patient demonstrates difficulty with performing exercises secondary to increased stiffness/pain but reports decreased pain after exercise performance. Patient will benefit from further skilled therapy focused on improving limitations to return to prior level of function.     Rehab Potential  Good    Clinical Impairments Affecting Rehab Potential  (+) highly motivated, family support (-) Previous knee pain    PT Frequency  2x / week    PT Duration  6 weeks    PT Treatment/Interventions  Dry needling;Taping;Passive range of motion;Manual techniques;Neuromuscular re-education;Gait training;Stair training;Therapeutic activities;Therapeutic exercise;Balance training;Patient/family education;Moist Heat;Ultrasound;Electrical Stimulation;Cryotherapy;Aquatic Therapy;Iontophoresis 4mg /ml Dexamethasone    PT Next Visit Plan  Progress strengthening and AROM    PT Home Exercise Plan  See education section    Consulted and Agree with Plan of Care  Patient       Patient will benefit from skilled therapeutic intervention in order to improve the following deficits and  impairments:  Abnormal gait, Increased fascial restricitons, Pain, Increased muscle spasms, Decreased coordination, Decreased endurance, Decreased range of motion, Decreased strength, Decreased balance  Visit Diagnosis: Other lack of coordination  Left knee pain, unspecified chronicity  Stiffness of left knee, not elsewhere classified     Problem List Patient Active Problem List   Diagnosis Date Noted  . S/P total knee arthroplasty, left 06/13/2017  . Bruising 03/25/2017  . Pre-op  evaluation 10/25/2016  . Bilateral knee pain 11/01/2015  . Thrombocytopenia ([place]) 11/01/2015  . AP (abdominal pain)   . Diverticulitis large intestine 10/01/2015    Blythe Stanford, PT DPT 07/03/2017, 2:01 PM  [place] [facility] [address] [place], [address] Phone: [phone]   Fax:  [phone]  Name: Rebecca Watkins MRN: [id number] Date of Birth: 1955-10-05

## 2017-07-07 ENCOUNTER — Ambulatory Visit: Payer: 59

## 2017-07-07 DIAGNOSIS — Z96652 Presence of left artificial knee joint: Secondary | ICD-10-CM | POA: Diagnosis not present

## 2017-07-07 DIAGNOSIS — M25662 Stiffness of left knee, not elsewhere classified: Secondary | ICD-10-CM | POA: Diagnosis not present

## 2017-07-07 DIAGNOSIS — R278 Other lack of coordination: Secondary | ICD-10-CM

## 2017-07-07 DIAGNOSIS — M25562 Pain in left knee: Secondary | ICD-10-CM | POA: Diagnosis not present

## 2017-07-07 DIAGNOSIS — R2689 Other abnormalities of gait and mobility: Secondary | ICD-10-CM | POA: Diagnosis not present

## 2017-07-07 NOTE — Therapy (Signed)
[place] [address] [place], [address] Phone: [phone]   Fax:  [phone]  Physical Therapy Treatment  Patient Details  Name: Rebecca Watkins MRN: [id number] Date of Birth: January 27, 1956 Referring Provider: Thornton Park   Encounter Date: 07/07/2017  PT End of Session - 07/07/17 1457    Visit Number  6    Number of Visits  13    Date for PT Re-Evaluation  07/30/17    PT Start Time  1300    PT Stop Time  1345    PT Time Calculation (min)  45 min    Activity Tolerance  Patient tolerated treatment well    Behavior During Therapy  [facility] for tasks assessed/performed       Past Medical History:  Diagnosis Date  . Arthritis    Osteoarthritis  . Breast discharge 04/2017   when expressed  . Diverticulitis 2017  . PONV (postoperative nausea and vomiting)   . Temporary low platelet count (HCC)    H/O    Past Surgical History:  Procedure Laterality Date  . COLONOSCOPY WITH PROPOFOL N/A 12/15/2015   Performed by Manya Silvas, MD at [place]  . KNEE ARTHROSCOPY WITH MEDIAL MENISECTOMY Left 10/31/2016   Performed by Thornton Park, MD at [facility] ORS  . TOTAL KNEE ARTHROPLASTY Left 06/13/2017   Performed by Thornton Park, MD at [facility] ORS  . TUBAL LIGATION      There were no vitals filed for this visit.  Subjective Assessment - 07/07/17 1347    Subjective  Patient reports her leg has been feeling better and states her husband feels her leg should be getting straighter. Patient states her knee feels a bit stiff today.     Currently in Pain?  Yes    Pain Score  5     Pain Location  Knee    Pain Orientation  Left    Pain Descriptors / Indicators  Aching    Pain Type  Surgical pain    Pain Onset  1 to 4 weeks ago    Pain Frequency  Constant       Manual Therapy: STM to patient's L patella with patient in long sitting to improve mobility to increase mobility mobilizations grade III 3  x 30sec. STM to patient's anterior thigh over quadriceps musculature.    Therapeutic Exercise: Squats in standing - 2 x 20 without UE support for last set of 10 Heel raises in standing - 2 x 20  Heel walking without UE support - x 7 (69ft each lap) Prone knee flexion ext - x 20 with 2 sec holds Step ups onto Bosu Ball - x 10  Leg Press at [facility] - x 10 #20    Patient demonstrates increased fatigue at end of the session.     PT Education - 07/07/17 1356    Education provided  Yes    Education Details  form/technique with exercise    Person(s) Educated  Patient    Methods  Explanation;Demonstration    Comprehension  Verbalized understanding;Returned demonstration          PT Long Term Goals - 06/18/17 1254      PT LONG TERM GOAL #1   Title  Patient will be independent with HEP focused on improving limitations to return to prior level of function.     Baseline  Dependent with form/technique    Time  6    Period  Weeks  Target Date  07/16/17      PT LONG TERM GOAL #2   Title  Patient will improve 23mWT to >1.0 m/s without use of AD to demonstrate significant improvement in function    Baseline  .3 m/s with FWW    Time  6    Period  Weeks    Status  New    Target Date  07/30/17      PT LONG TERM GOAL #3   Title  Pt will demonstrate knee AROM of extension: 0 and flexion >120deg to demonstrate greater ability to walk without increase in pain    Baseline  extension 10 (from neutral); flexion: 80deg    Time  6    Period  Weeks    Status  New    Target Date  07/30/17      PT LONG TERM GOAL #4   Title  Patient will be able to stand for over 2 hours without AD 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 Although patient is improving she continues to demonstrate decreased AROM, strength, and increased pain. Patient will benefit from further skilled therapy to return to prior level of function.     Rehab Potential  Good    Clinical Impairments Affecting Rehab Potential  (+) highly motivated, family support (-) Previous knee pain    PT Frequency  2x / week    PT Duration  6 weeks    PT Treatment/Interventions  Dry needling;Taping;Passive range of motion;Manual techniques;Neuromuscular re-education;Gait training;Stair training;Therapeutic activities;Therapeutic exercise;Balance training;Patient/family education;Moist Heat;Ultrasound;Electrical Stimulation;Cryotherapy;Aquatic Therapy;Iontophoresis 4mg /ml Dexamethasone    PT Next Visit Plan  Progress strengthening and AROM    PT Home Exercise Plan  See education section    Consulted and Agree with Plan of Care  Patient       Patient will benefit from skilled therapeutic intervention in order to improve the following deficits and impairments:  Abnormal gait, Increased fascial restricitons, Pain, Increased muscle spasms, Decreased coordination, Decreased endurance, Decreased range of motion, Decreased strength, Decreased balance  Visit Diagnosis: Other lack of coordination  Left knee pain, unspecified chronicity  Stiffness of left knee, not elsewhere classified     Problem List Patient Active Problem List   Diagnosis Date Noted  . S/P total knee arthroplasty, left 06/13/2017  . Bruising 03/25/2017  . Pre-op evaluation 10/25/2016  . Bilateral knee pain 11/01/2015  . Thrombocytopenia ([place]) 11/01/2015  . AP (abdominal pain)   . Diverticulitis large intestine 10/01/2015    Blythe Stanford, PT DPT 07/07/2017, 4:10 PM  [place] [facility] [address], [place],  [address] Phone: [phone]   Fax:  [phone]  Name: Rebecca Watkins MRN: [id number] Date of Birth: May 15, 1956

## 2017-07-09 ENCOUNTER — Ambulatory Visit: Payer: 59

## 2017-07-09 DIAGNOSIS — R278 Other lack of coordination: Secondary | ICD-10-CM | POA: Diagnosis not present

## 2017-07-09 DIAGNOSIS — M25662 Stiffness of left knee, not elsewhere classified: Secondary | ICD-10-CM | POA: Diagnosis not present

## 2017-07-09 DIAGNOSIS — M25562 Pain in left knee: Secondary | ICD-10-CM

## 2017-07-09 DIAGNOSIS — Z96652 Presence of left artificial knee joint: Secondary | ICD-10-CM | POA: Diagnosis not present

## 2017-07-09 DIAGNOSIS — R2689 Other abnormalities of gait and mobility: Secondary | ICD-10-CM | POA: Diagnosis not present

## 2017-07-09 NOTE — Therapy (Signed)
[facility] [address] [place], [address] Phone: [phone]   Fax:  [phone]  Physical Therapy Treatment  Patient Details  Name: Rebecca Watkins MRN: [id number] Date of Birth: [birth year] Referring Provider: Thornton Park   Encounter Date: 07/09/2017  PT End of Session - 07/09/17 1355    Visit Number  7    Number of Visits  13    Date for PT Re-Evaluation  07/30/17    PT Start Time  1300    PT Stop Time  1350    PT Time Calculation (min)  50 min    Activity Tolerance  Patient tolerated treatment well    Behavior During Therapy  [facility] for tasks assessed/performed       Past Medical History:  Diagnosis Date  . Arthritis    Osteoarthritis  . Breast discharge 04/2017   when expressed  . Diverticulitis 2017  . PONV (postoperative nausea and vomiting)   . Temporary low platelet count (HCC)    H/O    Past Surgical History:  Procedure Laterality Date  . COLONOSCOPY WITH PROPOFOL N/A 12/15/2015   Procedure: COLONOSCOPY WITH PROPOFOL;  Surgeon: Manya Silvas, MD;  Location: [facility] ENDOSCOPY;  Service: Endoscopy;  Laterality: N/A;  . KNEE ARTHROSCOPY WITH MEDIAL MENISECTOMY Left 10/31/2016   Procedure: KNEE ARTHROSCOPY WITH MEDIAL MENISECTOMY;  Surgeon: Thornton Park, MD;  Location: [facility] ORS;  Service: Orthopedics;  Laterality: Left;  . TOTAL KNEE ARTHROPLASTY Left 06/13/2017   Procedure: TOTAL KNEE ARTHROPLASTY;  Surgeon: Thornton Park, MD;  Location: [facility] ORS;  Service: Orthopedics;  Laterality: Left;  . TUBAL LIGATION      There were no vitals filed for this visit.  Subjective Assessment - 07/09/17 1305    Subjective  Patient reports her knee and back are hurting more today compared to the previous visit. Patient reports she's been having a difficulty with sleeping.     Currently in Pain?  Yes    Pain Score  4     Pain Location  Knee    Pain Orientation  Left    Pain Descriptors /  Indicators  Aching    Pain Type  Surgical pain    Pain Onset  1 to 4 weeks ago    Pain Frequency  Constant       Manual Therapy: STM to patient's anterior thigh and posterior knee over quadriceps musculature to decrease increased pain. Effleurage to patients knee in long sitting to decrease increased swelling and edema.   STM to patient's anterior thigh over quadriceps musculature.   Therapeutic Exercise: Step ups onto 9" -- x 25 Prone knee flexion ext - x 20 with 2 sec holds Seated Nustep for strength and decreasing pain - 32min level 4 Contract relax PROM knee flexion/extension in supine with isometric holds at end of AROM -- x 10 with 5 sec contraction at end range    Patient demonstrates increased fatigue at end of the session.     PT Education - 07/09/17 1355    Education provided  Yes    Education Details  form/technique with exercise    Person(s) Educated  Patient    Methods  Explanation;Demonstration    Comprehension  Verbalized understanding;Returned demonstration          PT Long Term Goals - 06/18/17 1254      PT LONG TERM GOAL #1   Title  Patient will be independent with HEP focused on improving limitations to  return to prior level of function.     Baseline  Dependent with form/technique    Time  6    Period  Weeks    Target Date  07/16/17      PT LONG TERM GOAL #2   Title  Patient will improve 60mWT to >1.0 m/s without use of AD to demonstrate significant improvement in function    Baseline  .3 m/s with FWW    Time  6    Period  Weeks    Status  New    Target Date  07/30/17      PT LONG TERM GOAL #3   Title  Pt will demonstrate knee AROM of extension: 0 and flexion >120deg to demonstrate greater ability to walk without increase in pain    Baseline  extension 10 (from neutral); flexion: 80deg    Time  6    Period  Weeks    Status  New    Target Date  07/30/17      PT LONG TERM GOAL #4   Title  Patient will be able to stand for over 2 hours  without AD 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 Patient demonstrates increased muscular guarding which was decreased after performing effluerage techniques and manual therapy. Patient will benfit from further skilled therapy to return to prior level of function.     Rehab Potential  Good    Clinical Impairments Affecting Rehab Potential  (+) highly motivated, family support (-) Previous knee pain    PT Frequency  2x / week    PT Duration  6 weeks    PT Treatment/Interventions  Dry needling;Taping;Passive range of motion;Manual techniques;Neuromuscular re-education;Gait training;Stair training;Therapeutic activities;Therapeutic exercise;Balance training;Patient/family education;Moist Heat;Ultrasound;Electrical Stimulation;Cryotherapy;Aquatic Therapy;Iontophoresis 4mg /ml Dexamethasone    PT Next Visit Plan  Progress strengthening and AROM    PT Home Exercise Plan  See education section    Consulted and Agree with Plan of Care  Patient       Patient will benefit from skilled therapeutic intervention in order to improve the following deficits and impairments:  Abnormal gait, Increased fascial restricitons, Pain, Increased muscle spasms, Decreased coordination, Decreased endurance, Decreased range of motion, Decreased strength, Decreased balance  Visit Diagnosis: Other lack of coordination  Left knee pain, unspecified chronicity  Stiffness of left knee, not elsewhere classified     Problem List Patient Active Problem List   Diagnosis Date Noted  . S/P total knee arthroplasty, left 06/13/2017  . Bruising 03/25/2017  . Pre-op evaluation 10/25/2016  . Bilateral knee pain 11/01/2015  .  Thrombocytopenia ([place]) 11/01/2015  . AP (abdominal pain)   . Diverticulitis large intestine 10/01/2015    Blythe Stanford, PT DPT 07/09/2017, 2:11 PM  [place] [address] [place], [address] Phone: [phone]   Fax:  [phone]  Name: Rebecca Watkins MRN: [id number] Date of Birth: [birth year]

## 2017-07-15 ENCOUNTER — Ambulatory Visit: Payer: 59

## 2017-07-15 DIAGNOSIS — R278 Other lack of coordination: Secondary | ICD-10-CM

## 2017-07-15 DIAGNOSIS — R2689 Other abnormalities of gait and mobility: Secondary | ICD-10-CM | POA: Diagnosis not present

## 2017-07-15 DIAGNOSIS — M25562 Pain in left knee: Secondary | ICD-10-CM

## 2017-07-15 DIAGNOSIS — M25662 Stiffness of left knee, not elsewhere classified: Secondary | ICD-10-CM | POA: Diagnosis not present

## 2017-07-15 DIAGNOSIS — Z96652 Presence of left artificial knee joint: Secondary | ICD-10-CM | POA: Diagnosis not present

## 2017-07-15 NOTE — Therapy (Signed)
[place] [facility] [address], [place], [address] Phone: [phone]   Fax:  [phone]  Physical Therapy Treatment  Patient Details  Name: Rebecca Watkins MRN: [id number] Date of Birth: 08/18/1956 Referring Provider: Thornton Park   Encounter Date: 07/15/2017  PT End of Session - 07/15/17 1417    Visit Number  8    Number of Visits  13    Date for PT Re-Evaluation  07/30/17    PT Start Time  1300    PT Stop Time  1355    PT Time Calculation (min)  55 min    Activity Tolerance  Patient tolerated treatment well    Behavior During Therapy  [facility] for tasks assessed/performed       Past Medical History:  Diagnosis Date  . Arthritis    Osteoarthritis  . Breast discharge 04/2017   when expressed  . Diverticulitis 2017  . PONV (postoperative nausea and vomiting)   . Temporary low platelet count (HCC)    H/O    Past Surgical History:  Procedure Laterality Date  . COLONOSCOPY WITH PROPOFOL N/A 12/15/2015   Procedure: COLONOSCOPY WITH PROPOFOL;  Surgeon: Manya Silvas, MD;  Location: [facility] ENDOSCOPY;  Service: Endoscopy;  Laterality: N/A;  . KNEE ARTHROSCOPY WITH MEDIAL MENISECTOMY Left 10/31/2016   Procedure: KNEE ARTHROSCOPY WITH MEDIAL MENISECTOMY;  Surgeon: Thornton Park, MD;  Location: [facility] ORS;  Service: Orthopedics;  Laterality: Left;  . TOTAL KNEE ARTHROPLASTY Left 06/13/2017   Procedure: TOTAL KNEE ARTHROPLASTY;  Surgeon: Thornton Park, MD;  Location: [facility] ORS;  Service: Orthopedics;  Laterality: Left;  . TUBAL LIGATION      There were no vitals filed for this visit.  Subjective Assessment - 07/15/17 1332    Subjective  Patient reports increased stiffness along her knee today but reports she's been able to perform a greater amount of exercises compared to the previous week.     Currently in Pain?  Yes    Pain Score  4     Pain Orientation  Left    Pain Descriptors / Indicators  Aching    Pain Type  Surgical pain    Pain Onset  1 to 4 weeks ago    Pain Frequency  Constant       Manual Therapy: Effleurage to patient's knee in long sitting to decrease increased swelling and edema andSTM to patient's anterior thigh over quadriceps musculature to decrease pain in LE   Therapeutic Exercise: Step ups onto 6" -- x 25 Side stepping up and over 6" step - x 15 Heel raises off of 3" step - 2 x 13 Stairs up and down (4 stairs) - x 7 Seated Nustep for strength and decreasing pain - 49min level 5 Leg Press - Quad leg press - x25 55#; unilaterally on R LE only x25 20#    Patient demonstrates increased fatigue at end of the session.   PT Education - 07/15/17 1358    Education provided  Yes    Education Details  form/technique with exercise    Person(s) Educated  Patient    Methods  Explanation;Demonstration    Comprehension  Verbalized understanding;Returned demonstration          PT Long Term Goals - 06/18/17 1254      PT LONG TERM GOAL #1   Title  Patient will be independent with HEP focused on improving limitations to return to prior level of function.     Baseline  Dependent with form/technique    Time  6    Period  Weeks    Target Date  07/16/17      PT LONG TERM GOAL #2   Title  Patient will improve 4mWT to >1.0 m/s without use of AD to demonstrate significant improvement in function    Baseline  .3 m/s with FWW    Time  6    Period  Weeks    Status  New    Target Date  07/30/17      PT LONG TERM GOAL #3   Title  Pt will demonstrate knee AROM of extension: 0 and flexion >120deg to demonstrate greater ability to walk without increase in pain    Baseline  extension 10 (from neutral); flexion: 80deg    Time  6    Period  Weeks    Status  New    Target Date  07/30/17      PT LONG TERM GOAL #4   Title  Patient will be able to stand for over 2 hours without AD 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 Focused on improving quadriceps activation and strength with exercise and patient will benefit from further skilled therapy to return to prior level of function.     Rehab Potential  Good    Clinical Impairments Affecting Rehab Potential  (+) highly motivated, family support (-) Previous knee pain    PT Frequency  2x / week    PT Duration  6 weeks    PT Treatment/Interventions  Dry needling;Taping;Passive range of motion;Manual techniques;Neuromuscular re-education;Gait training;Stair training;Therapeutic activities;Therapeutic exercise;Balance training;Patient/family education;Moist Heat;Ultrasound;Electrical Stimulation;Cryotherapy;Aquatic Therapy;Iontophoresis 4mg /ml Dexamethasone    PT Next Visit Plan  Progress strengthening and AROM    PT Home Exercise Plan  See education section    Consulted and Agree with Plan of Care  Patient       Patient will benefit from skilled therapeutic intervention in order to improve the following deficits and impairments:  Abnormal gait, Increased fascial restricitons, Pain, Increased muscle spasms, Decreased coordination, Decreased endurance, Decreased range of motion, Decreased strength, Decreased balance  Visit Diagnosis: Other lack of coordination  Left knee pain, unspecified chronicity  Stiffness of left knee, not elsewhere classified  Other abnormalities of gait and mobility     Problem List Patient Active Problem List   Diagnosis Date Noted  . S/P total knee arthroplasty, left 06/13/2017  . Bruising 03/25/2017  . Pre-op evaluation 10/25/2016  . Bilateral knee pain 11/01/2015  . Thrombocytopenia ([place]) 11/01/2015  . AP (abdominal pain)   .  Diverticulitis large intestine 10/01/2015    Blythe Stanford, PT DPT 07/15/2017, 2:21 PM  [place] [facility] [address] [place], [address] Phone: [phone]   Fax:  [phone]  Name: Rebecca Watkins MRN: [id number] Date of Birth: Feb 19, 1956

## 2017-07-17 ENCOUNTER — Ambulatory Visit: Payer: 59

## 2017-07-17 DIAGNOSIS — R278 Other lack of coordination: Secondary | ICD-10-CM | POA: Diagnosis not present

## 2017-07-17 DIAGNOSIS — Z96652 Presence of left artificial knee joint: Secondary | ICD-10-CM | POA: Diagnosis not present

## 2017-07-17 DIAGNOSIS — M25562 Pain in left knee: Secondary | ICD-10-CM

## 2017-07-17 DIAGNOSIS — M25662 Stiffness of left knee, not elsewhere classified: Secondary | ICD-10-CM | POA: Diagnosis not present

## 2017-07-17 DIAGNOSIS — R2689 Other abnormalities of gait and mobility: Secondary | ICD-10-CM | POA: Diagnosis not present

## 2017-07-17 NOTE — Therapy (Signed)
[place] [facility] [address], [place], [address] Phone: [phone]   Fax:  [phone]  Physical Therapy Treatment  Patient Details  Name: Leonia Heatherly MRN: [id number] Date of Birth: 1956/01/14 Referring Provider: Thornton Park   Encounter Date: 07/17/2017  PT End of Session - 07/17/17 1328    Visit Number  9    Number of Visits  13    Date for PT Re-Evaluation  07/30/17    PT Start Time  1300    PT Stop Time  1345    PT Time Calculation (min)  45 min    Activity Tolerance  Patient tolerated treatment well    Behavior During Therapy  [facility] for tasks assessed/performed       Past Medical History:  Diagnosis Date  . Arthritis    Osteoarthritis  . Breast discharge 04/2017   when expressed  . Diverticulitis 2017  . PONV (postoperative nausea and vomiting)   . Temporary low platelet count (HCC)    H/O    Past Surgical History:  Procedure Laterality Date  . COLONOSCOPY WITH PROPOFOL N/A 12/15/2015   Procedure: COLONOSCOPY WITH PROPOFOL;  Surgeon: Manya Silvas, MD;  Location: [facility] ENDOSCOPY;  Service: Endoscopy;  Laterality: N/A;  . KNEE ARTHROSCOPY WITH MEDIAL MENISECTOMY Left 10/31/2016   Procedure: KNEE ARTHROSCOPY WITH MEDIAL MENISECTOMY;  Surgeon: Thornton Park, MD;  Location: [facility] ORS;  Service: Orthopedics;  Laterality: Left;  . TOTAL KNEE ARTHROPLASTY Left 06/13/2017   Procedure: TOTAL KNEE ARTHROPLASTY;  Surgeon: Thornton Park, MD;  Location: [facility] ORS;  Service: Orthopedics;  Laterality: Left;  . TUBAL LIGATION      There were no vitals filed for this visit.  Subjective Assessment - 07/17/17 1319    Subjective  Patient reports her knee is feeling stiff and is seeing her orthopedic surgeon next wednesday for a check up.     Pertinent History  PMH: diverticulitis    Limitations  Standing    How long can you stand comfortably?  32min    Diagnostic tests  X-Ray    Patient Stated  Goals  Return to dialy activities; dance     Currently in Pain?  Yes    Pain Score  5     Pain Location  Knee    Pain Orientation  Left    Pain Descriptors / Indicators  Aching    Pain Type  Surgical pain    Pain Onset  1 to 4 weeks ago    Pain Frequency  Constant          Manual Therapy: Effleurage to patient's knee in long sitting to decrease increased swelling and edema andSTM to patient's anterior thigh over quadriceps musculature to decrease pain in LE   Therapeutic Exercise: Side Stepping up and over Bosu Ball - x 20 Leg Press - Quad leg press - x25 55#; unilaterally on R LE only 2x25 25# Feet apart balancing on black side of the bosu - ball toss x 20 TRX squats - 2 x 20  Step ups onto 6" -- x 25 Seated Nustep for strength and decreasing pain - 13min level 5     Patient demonstrates increased fatigue at end of the session.    PT Education - 07/17/17 1327    Education provided  Yes    Education Details  form/technique with exercise    Person(s) Educated  Patient    Methods  Explanation;Demonstration    Comprehension  Verbalized  understanding;Returned demonstration          PT Long Term Goals - 06/18/17 1254      PT LONG TERM GOAL #1   Title  Patient will be independent with HEP focused on improving limitations to return to prior level of function.     Baseline  Dependent with form/technique    Time  6    Period  Weeks    Target Date  07/16/17      PT LONG TERM GOAL #2   Title  Patient will improve 40mWT to >1.0 m/s without use of AD to demonstrate significant improvement in function    Baseline  .3 m/s with FWW    Time  6    Period  Weeks    Status  New    Target Date  07/30/17      PT LONG TERM GOAL #3   Title  Pt will demonstrate knee AROM of extension: 0 and flexion >120deg to demonstrate greater ability to walk without increase in pain    Baseline  extension 10 (from neutral); flexion: 80deg    Time  6    Period  Weeks    Status  New    Target  Date  07/30/17      PT LONG TERM GOAL #4   Title  Patient will be able to stand for over 2 hours without 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 Patient demonstrates improvement with resistance based exercise with ability to perform greater amount of resistance compared to the previous visits indicating functional carryover. Patient will benefit from further skilled therapy focused on improving limitations to return to prior level of function.     Rehab Potential  Good    Clinical Impairments Affecting Rehab Potential  (+) highly motivated, family support (-) Previous knee pain    PT Frequency  2x / week    PT Duration  6 weeks    PT Treatment/Interventions  Dry needling;Taping;Passive range of motion;Manual techniques;Neuromuscular re-education;Gait training;Stair training;Therapeutic activities;Therapeutic exercise;Balance training;Patient/family education;Moist Heat;Ultrasound;Electrical Stimulation;Cryotherapy;Aquatic Therapy;Iontophoresis 4mg /ml Dexamethasone    PT Next Visit Plan  Progress strengthening and AROM    PT Home Exercise Plan  See education section    Consulted and Agree with Plan of Care  Patient       Patient will benefit from skilled therapeutic intervention in order to improve the following deficits and impairments:  Abnormal gait, Increased fascial restricitons, Pain, Increased muscle spasms, Decreased coordination, Decreased endurance, Decreased range of motion, Decreased strength, Decreased balance  Visit Diagnosis: Other lack of coordination  Left knee pain, unspecified chronicity  Stiffness of  left knee, not elsewhere classified     Problem List Patient Active Problem List   Diagnosis Date Noted  . S/P total knee arthroplasty, left 06/13/2017  . Bruising 03/25/2017  . Pre-op evaluation 10/25/2016  . Bilateral knee pain 11/01/2015  . Thrombocytopenia ([place]) 11/01/2015  . AP (abdominal pain)   . Diverticulitis large  intestine 10/01/2015    Blythe Stanford, PT DPT 07/17/2017, 3:42 PM  [place] [facility] [address] [place], [address] Phone: [phone]   Fax:  [phone]  Name: Perrin Eddleman MRN: [id number] Date of Birth: 06-14-1956

## 2017-07-22 ENCOUNTER — Ambulatory Visit: Payer: 59 | Attending: Orthopedic Surgery

## 2017-07-22 DIAGNOSIS — M79602 Pain in left arm: Secondary | ICD-10-CM | POA: Diagnosis not present

## 2017-07-22 DIAGNOSIS — R2689 Other abnormalities of gait and mobility: Secondary | ICD-10-CM | POA: Insufficient documentation

## 2017-07-22 DIAGNOSIS — M25662 Stiffness of left knee, not elsewhere classified: Secondary | ICD-10-CM | POA: Diagnosis not present

## 2017-07-22 DIAGNOSIS — R278 Other lack of coordination: Secondary | ICD-10-CM | POA: Insufficient documentation

## 2017-07-22 DIAGNOSIS — M25562 Pain in left knee: Secondary | ICD-10-CM | POA: Insufficient documentation

## 2017-07-22 NOTE — Therapy (Signed)
[facility] [address] [place], [address] Phone: [phone]   Fax:  [phone]  Physical Therapy Treatment  Patient Details  Name: Rebecca Watkins MRN: [id number] Date of Birth: October 09, 1955 Referring Provider: Thornton Park   Encounter Date: 07/22/2017  PT End of Session - 07/22/17 1311    Visit Number  10    Number of Visits  13    Date for PT Re-Evaluation  07/30/17    PT Start Time  3151    PT Stop Time  1343    PT Time Calculation (min)  40 min    Activity Tolerance  Patient tolerated treatment well    Behavior During Therapy  [facility] for tasks assessed/performed       Past Medical History:  Diagnosis Date  . Arthritis    Osteoarthritis  . Breast discharge 04/2017   when expressed  . Diverticulitis 2017  . PONV (postoperative nausea and vomiting)   . Temporary low platelet count (HCC)    H/O    Past Surgical History:  Procedure Laterality Date  . COLONOSCOPY WITH PROPOFOL N/A 12/15/2015   Procedure: COLONOSCOPY WITH PROPOFOL;  Surgeon: Manya Silvas, MD;  Location: [facility] ENDOSCOPY;  Service: Endoscopy;  Laterality: N/A;  . KNEE ARTHROSCOPY WITH MEDIAL MENISECTOMY Left 10/31/2016   Procedure: KNEE ARTHROSCOPY WITH MEDIAL MENISECTOMY;  Surgeon: Thornton Park, MD;  Location: [facility] ORS;  Service: Orthopedics;  Laterality: Left;  . TOTAL KNEE ARTHROPLASTY Left 06/13/2017   Procedure: TOTAL KNEE ARTHROPLASTY;  Surgeon: Thornton Park, MD;  Location: [facility] ORS;  Service: Orthopedics;  Laterality: Left;  . TUBAL LIGATION      There were no vitals filed for this visit.  Subjective Assessment - 07/22/17 1308    Subjective  Pt reports additional pain over the wekend up until Saturday, maybe d/t increaesd activies in PT on Thursday, but pain improved today. HEP still going well.     Currently in Pain?  Yes    Pain Score  0-No pain 5/10 earlier todya before meds    Pain Location  Knee    Pain  Orientation  Left    Pain Descriptors / Indicators  Aching         INTERVENTION THIS SESSION: Therapeutic Exercise -Supine heel slides, continuous for 3 minutes to warm-up joint, decrease effusion -Supine knee flexion stretch 10x10sec  -Supine Quad set 10x3secH  -Supine Left SAQ on bolster: 2x15 -Functional squats, chair target at 21.5 inches, arms in full flexion for balance and trunk extension: 2x10  -Forward step-ups: 2x15 Left, 0-UE support  -SLS, firm surface, 5x5sec bilat  -SLS, foam surface, 5x5sec bilat -Lateral step up and over, Bosu soft side up, 1x15   P/ROM assessed this session as:  Left Knee flexion 21-110 degrees  Additional AA/ROM- uncharged time Seated Nustep for strength and decreasing pain - 71min level 5 (extra time)     PT Long Term Goals - 06/18/17 1254      PT LONG TERM GOAL #1   Title  Patient will be independent with HEP focused on improving limitations to return to prior level of function.     Baseline  Dependent with form/technique    Time  6    Period  Weeks    Target Date  07/16/17      PT LONG TERM GOAL #2   Title  Patient will improve 34mWT to >1.0 m/s without use of AD to demonstrate significant improvement in function  Baseline  .3 m/s with FWW    Time  6    Period  Weeks    Status  New    Target Date  07/30/17      PT LONG TERM GOAL #3   Title  Pt will demonstrate knee AROM of extension: 0 and flexion >120deg to demonstrate greater ability to walk without increase in pain    Baseline  extension 10 (from neutral); flexion: 80deg    Time  6    Period  Weeks    Status  New    Target Date  07/30/17      PT LONG TERM GOAL #4   Title  Patient will be able to stand for over 2 hours without AD to 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 Pt tolerating well, no increase in pain. Mild VC required fo rfor correction intermittiently, but good evidence of learning. With improved ROM in knee (20-110 degree flexion) education of correct squatting form is achieved. Pt making good progress toward goals overall.     Rehab Potential  Good    Clinical Impairments Affecting Rehab Potential  (+) highly motivated, family support (-) Previous knee pain    PT Frequency  2x / week    PT Duration  6 weeks    PT Treatment/Interventions  Dry needling;Taping;Passive range of motion;Manual techniques;Neuromuscular re-education;Gait training;Stair training;Therapeutic activities;Therapeutic exercise;Balance training;Patient/family education;Moist Heat;Ultrasound;Electrical Stimulation;Cryotherapy;Aquatic Therapy;Iontophoresis 4mg /ml Dexamethasone    PT Next Visit Plan  Progress strengthening and AROM    PT Home Exercise Plan  See education section    Consulted and Agree with Plan of Care  Patient       Patient will benefit from skilled therapeutic intervention in order to improve the following deficits and impairments:  Abnormal gait, Increased fascial restricitons, Pain, Increased muscle spasms, Decreased coordination, Decreased endurance, Decreased range of motion, Decreased strength, Decreased balance  Visit Diagnosis: Other lack of coordination  Left knee pain, unspecified chronicity  Stiffness of left knee, not elsewhere classified  Other abnormalities of gait and mobility  Pain in left arm     Problem List Patient Active Problem List   Diagnosis Date Noted  . S/P total knee arthroplasty, left 06/13/2017  . Bruising 03/25/2017  . Pre-op evaluation 10/25/2016  . Bilateral knee pain 11/01/2015  . Thrombocytopenia ([place]) 11/01/2015  . AP (abdominal pain)   . Diverticulitis large  intestine 10/01/2015   1:47 PM, 07/22/17 Etta Grandchild, PT, DPT [facility] [place] [phone] (Office)   Anup Brigham C 07/22/2017, 1:45 PM  [facility] [address], [place], [address] Phone: [phone]   Fax:  [phone]  Name: Amarys Sliwinski MRN: [id number] Date of Birth: [birth year]

## 2017-07-23 DIAGNOSIS — Z96652 Presence of left artificial knee joint: Secondary | ICD-10-CM | POA: Diagnosis not present

## 2017-07-24 ENCOUNTER — Ambulatory Visit: Payer: 59

## 2017-07-24 DIAGNOSIS — R2689 Other abnormalities of gait and mobility: Secondary | ICD-10-CM | POA: Diagnosis not present

## 2017-07-24 DIAGNOSIS — R278 Other lack of coordination: Secondary | ICD-10-CM

## 2017-07-24 DIAGNOSIS — M79602 Pain in left arm: Secondary | ICD-10-CM | POA: Diagnosis not present

## 2017-07-24 DIAGNOSIS — M25662 Stiffness of left knee, not elsewhere classified: Secondary | ICD-10-CM

## 2017-07-24 DIAGNOSIS — M25562 Pain in left knee: Secondary | ICD-10-CM | POA: Diagnosis not present

## 2017-07-24 NOTE — Therapy (Signed)
[facility] [address], [place], [address] Phone: [phone]   Fax:  [phone]  Physical Therapy Treatment  Patient Details  Name: Rebecca Watkins MRN: [id number] Date of Birth: Jan 04, 1956 Referring Provider: Thornton Park   Encounter Date: 07/24/2017  PT End of Session - 07/24/17 1422    Visit Number  11    Number of Visits  13    Date for PT Re-Evaluation  07/30/17    PT Start Time  9702    PT Stop Time  1432    PT Time Calculation (min)  47 min    Activity Tolerance  Patient tolerated treatment well    Behavior During Therapy  [facility] for tasks assessed/performed       Past Medical History:  Diagnosis Date  . Arthritis    Osteoarthritis  . Breast discharge 04/2017   when expressed  . Diverticulitis 2017  . PONV (postoperative nausea and vomiting)   . Temporary low platelet count (HCC)    H/O    Past Surgical History:  Procedure Laterality Date  . COLONOSCOPY WITH PROPOFOL N/A 12/15/2015   Procedure: COLONOSCOPY WITH PROPOFOL;  Surgeon: Manya Silvas, MD;  Location: [facility] ENDOSCOPY;  Service: Endoscopy;  Laterality: N/A;  . KNEE ARTHROSCOPY WITH MEDIAL MENISECTOMY Left 10/31/2016   Procedure: KNEE ARTHROSCOPY WITH MEDIAL MENISECTOMY;  Surgeon: Thornton Park, MD;  Location: [facility] ORS;  Service: Orthopedics;  Laterality: Left;  . TOTAL KNEE ARTHROPLASTY Left 06/13/2017   Procedure: TOTAL KNEE ARTHROPLASTY;  Surgeon: Thornton Park, MD;  Location: [facility] ORS;  Service: Orthopedics;  Laterality: Left;  . TUBAL LIGATION      There were no vitals filed for this visit.  Subjective Assessment - 07/24/17 1416    Subjective  Patient reports increased pain over this past week and patient states it is not consistent with any motions. Patient reports she's been performing exercises.     Currently in Pain?  Yes    Pain Score  5     Pain Location  Knee    Pain Orientation  Left    Pain Descriptors  / Indicators  Aching    Pain Type  Surgical pain    Pain Onset  More than a month ago    Pain Frequency  Constant       Manual Therapy: Effleurage to patient's knee in long sitting to decrease increased swelling and edema andSTM to patient's anterior thigh over quadriceps musculature to decrease pain in LE   Therapeutic Exercise: Single leg RDL - 2 x 20  TRX squats - 2 x 20  Heel lifts off of the step - 2x20 Straight leg raises hip abd with add - 2 x 20  Step ups onto 6" -- x 25 Seated Nustep for strength and decreasing pain - 79min level 5     Patient demonstrates increased fatigue at end of the session.    PT Education - 07/24/17 1420    Education provided  Yes    Education Details  HEP with exercise    Person(s) Educated  Patient    Methods  Explanation;Demonstration    Comprehension  Verbalized understanding;Returned demonstration          PT Long Term Goals - 06/18/17 1254      PT LONG TERM GOAL #1   Title  Patient will be independent with HEP focused on improving limitations to return to prior level of function.     Baseline  Dependent with form/technique    Time  6    Period  Weeks    Target Date  07/16/17      PT LONG TERM GOAL #2   Title  Patient will improve 4mWT to >1.0 m/s without use of AD to demonstrate significant improvement in function    Baseline  .3 m/s with FWW    Time  6    Period  Weeks    Status  New    Target Date  07/30/17      PT LONG TERM GOAL #3   Title  Pt will demonstrate knee AROM of extension: 0 and flexion >120deg to demonstrate greater ability to walk without increase in pain    Baseline  extension 10 (from neutral); flexion: 80deg    Time  6    Period  Weeks    Status  New    Target Date  07/30/17      PT LONG TERM GOAL #4   Title  Patient will be able to stand for over 2 hours without AD 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 Although patient is improving, she continues to demonstrate decreased AROM and increased pain. Patient will benefit from further skilled therapy to return to prior level of function.     Rehab Potential  Good    Clinical Impairments Affecting Rehab Potential  (+) highly motivated, family support (-) Previous knee pain    PT Frequency  2x / week    PT Duration  6 weeks    PT Treatment/Interventions  Dry needling;Taping;Passive range of motion;Manual techniques;Neuromuscular re-education;Gait training;Stair training;Therapeutic activities;Therapeutic exercise;Balance training;Patient/family education;Moist Heat;Ultrasound;Electrical Stimulation;Cryotherapy;Aquatic Therapy;Iontophoresis 4mg /ml Dexamethasone    PT Next Visit Plan  Progress strengthening and AROM    PT Home Exercise Plan  See education section    Consulted and Agree with Plan of Care  Patient       Patient will benefit from skilled therapeutic intervention in order to improve the following deficits and impairments:  Abnormal gait, Increased fascial restricitons, Pain, Increased muscle spasms, Decreased coordination, Decreased endurance, Decreased range of motion, Decreased strength, Decreased balance  Visit Diagnosis: Other lack of coordination  Left knee pain, unspecified chronicity  Stiffness of left knee, not elsewhere classified     Problem List Patient Active Problem List   Diagnosis Date Noted  . S/P total knee arthroplasty, left 06/13/2017  . Bruising 03/25/2017  . Pre-op evaluation 10/25/2016  . Bilateral knee pain 11/01/2015  . Thrombocytopenia ([place]) 11/01/2015  . AP (abdominal pain)   . Diverticulitis large intestine 10/01/2015    Blythe Stanford, PT DPT 07/24/2017, 2:39 PM  [facility] [address] [place], [address] Phone: [phone]   Fax:  [phone]  Name: Rebecca Watkins MRN: [id number] Date of Birth: 1956-03-11

## 2017-07-29 ENCOUNTER — Ambulatory Visit: Payer: 59

## 2017-07-29 DIAGNOSIS — M25662 Stiffness of left knee, not elsewhere classified: Secondary | ICD-10-CM | POA: Diagnosis not present

## 2017-07-29 DIAGNOSIS — M25562 Pain in left knee: Secondary | ICD-10-CM

## 2017-07-29 DIAGNOSIS — R2689 Other abnormalities of gait and mobility: Secondary | ICD-10-CM | POA: Diagnosis not present

## 2017-07-29 DIAGNOSIS — R278 Other lack of coordination: Secondary | ICD-10-CM

## 2017-07-29 DIAGNOSIS — M79602 Pain in left arm: Secondary | ICD-10-CM | POA: Diagnosis not present

## 2017-07-29 NOTE — Therapy (Signed)
[facility] [address] [place], [address] Phone: [phone]   Fax:  [phone]  Physical Therapy Treatment  Patient Details  Name: Donye Dauenhauer MRN: [id number] Date of Birth: May 20, 1956 Referring Provider: Thornton Park   Encounter Date: 07/29/2017  PT End of Session - 07/29/17 9622    Visit Number  12    Number of Visits  13    Date for PT Re-Evaluation  07/30/17    PT Start Time  1430    PT Stop Time  1515    PT Time Calculation (min)  45 min    Activity Tolerance  Patient tolerated treatment well    Behavior During Therapy  [facility] for tasks assessed/performed       Past Medical History:  Diagnosis Date  . Arthritis    Osteoarthritis  . Breast discharge 04/2017   when expressed  . Diverticulitis 2017  . PONV (postoperative nausea and vomiting)   . Temporary low platelet count (HCC)    H/O    Past Surgical History:  Procedure Laterality Date  . COLONOSCOPY WITH PROPOFOL N/A 12/15/2015   Procedure: COLONOSCOPY WITH PROPOFOL;  Surgeon: Manya Silvas, MD;  Location: [facility] ENDOSCOPY;  Service: Endoscopy;  Laterality: N/A;  . KNEE ARTHROSCOPY WITH MEDIAL MENISECTOMY Left 10/31/2016   Procedure: KNEE ARTHROSCOPY WITH MEDIAL MENISECTOMY;  Surgeon: Thornton Park, MD;  Location: [facility] ORS;  Service: Orthopedics;  Laterality: Left;  . TOTAL KNEE ARTHROPLASTY Left 06/13/2017   Procedure: TOTAL KNEE ARTHROPLASTY;  Surgeon: Thornton Park, MD;  Location: [facility] ORS;  Service: Orthopedics;  Laterality: Left;  . TUBAL LIGATION      There were no vitals filed for this visit.  Subjective Assessment - 07/29/17 1516    Subjective  Patient reports decreased pain compared to the previous session. Patient reports increased pain with use of LE. Patient states she's been performing exercises at home.     Currently in Pain?  Yes    Pain Score  5     Pain Location  Knee    Pain Orientation  Left    Pain  Descriptors / Indicators  Aching    Pain Type  Surgical pain    Pain Onset  More than a month ago       Manual Therapy: Effleurage to patient's knee in long sitting to decrease increased swelling and edema and STM to patient's anterior thigh over quadriceps musculature to decrease pain in LE.  Knee mobilizations in prone P-A grade III-IV to improve mobility - 6 x 30sec  Resisted knee flexion and extension 10 sec holds x 5 in prone   Therapeutic Exercise: Single leg RDL - x 20  TRX squats - x 20  Step ups onto 9" -- x 25 Heel lifts off of the step - x10, x 5 unilaterally  Leg Press in OMEGA - x 20 45# Seated Nustep for strength and decreasing pain - 39min level 5     Patient demonstrates increased fatigue at end of the session.   PT Education - 07/29/17 1613    Education provided  Yes    Education Details  Maintaining extension at home    Person(s) Educated  Patient    Methods  Explanation;Demonstration    Comprehension  Verbalized understanding;Returned demonstration          PT Long Term Goals - 06/18/17 1254      PT LONG TERM GOAL #1   Title  Patient will  be independent with HEP focused on improving limitations to return to prior level of function.     Baseline  Dependent with form/technique    Time  6    Period  Weeks    Target Date  07/16/17      PT LONG TERM GOAL #2   Title  Patient will improve 33mWT to >1.0 m/s without use of AD to demonstrate significant improvement in function    Baseline  .3 m/s with FWW    Time  6    Period  Weeks    Status  New    Target Date  07/30/17      PT LONG TERM GOAL #3   Title  Pt will demonstrate knee AROM of extension: 0 and flexion >120deg to demonstrate greater ability to walk without increase in pain    Baseline  extension 10 (from neutral); flexion: 80deg    Time  6    Period  Weeks    Status  New    Target Date  07/30/17      PT LONG TERM GOAL #4   Title  Patient will be able to stand for over 2 hours without AD  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 Although patient is improving, she continues to demonstrate knee extension hypomobility and decreased strength with performing functional activities. Patient will benefit from further skilled therapy focused on improving limitations to return to prior level of function.     Rehab Potential  Good    Clinical Impairments Affecting Rehab Potential  (+) highly motivated, family support (-) Previous knee pain    PT Frequency  2x / week    PT Duration  6 weeks    PT Treatment/Interventions  Dry needling;Taping;Passive range of motion;Manual techniques;Neuromuscular re-education;Gait training;Stair training;Therapeutic activities;Therapeutic exercise;Balance training;Patient/family education;Moist Heat;Ultrasound;Electrical Stimulation;Cryotherapy;Aquatic Therapy;Iontophoresis 4mg /ml Dexamethasone    PT Next Visit Plan  Progress strengthening and AROM    PT Home Exercise Plan  See education section    Consulted and Agree with Plan of Care  Patient       Patient will benefit from skilled therapeutic intervention in order to improve the following deficits and impairments:  Abnormal gait, Increased fascial restricitons, Pain, Increased muscle spasms, Decreased coordination, Decreased endurance, Decreased range of motion, Decreased strength, Decreased balance  Visit Diagnosis: Other lack of coordination  Left knee pain, unspecified chronicity  Stiffness of left knee, not elsewhere classified     Problem List Patient Active Problem List   Diagnosis Date Noted  . S/P total knee arthroplasty, left 06/13/2017  .  Bruising 03/25/2017  . Pre-op evaluation 10/25/2016  . Bilateral knee pain 11/01/2015  . Thrombocytopenia ([place]) 11/01/2015  . AP (abdominal pain)   . Diverticulitis large intestine 10/01/2015    Blythe Stanford, PT DPT 07/29/2017, 4:16 PM  [facility] [address], [place], [address] Phone: [phone]   Fax:  [phone]  Name: Emely Fahy MRN: [id number] Date of Birth: [birth year]

## 2017-07-31 ENCOUNTER — Ambulatory Visit: Payer: 59

## 2017-07-31 DIAGNOSIS — M25662 Stiffness of left knee, not elsewhere classified: Secondary | ICD-10-CM

## 2017-07-31 DIAGNOSIS — M25562 Pain in left knee: Secondary | ICD-10-CM

## 2017-07-31 DIAGNOSIS — R278 Other lack of coordination: Secondary | ICD-10-CM | POA: Diagnosis not present

## 2017-07-31 DIAGNOSIS — M79602 Pain in left arm: Secondary | ICD-10-CM | POA: Diagnosis not present

## 2017-07-31 DIAGNOSIS — R2689 Other abnormalities of gait and mobility: Secondary | ICD-10-CM | POA: Diagnosis not present

## 2017-07-31 NOTE — Therapy (Signed)
[facility] [address] [place], [address] Phone: [phone]   Fax:  [phone]  Physical Therapy Treatment  Patient Details  Name: Rebecca Watkins MRN: [id number] Date of Birth: 03-Jul-1956 Referring Provider: Thornton Park   Encounter Date: 07/31/2017  PT End of Session - 07/31/17 1447    Visit Number  13    Number of Visits  21    Date for PT Re-Evaluation  09/11/17    PT Start Time  5277    PT Stop Time  1500    PT Time Calculation (min)  45 min    Activity Tolerance  Patient tolerated treatment well    Behavior During Therapy  [facility] for tasks assessed/performed       Past Medical History:  Diagnosis Date  . Arthritis    Osteoarthritis  . Breast discharge 04/2017   when expressed  . Diverticulitis 2017  . PONV (postoperative nausea and vomiting)   . Temporary low platelet count (HCC)    H/O    Past Surgical History:  Procedure Laterality Date  . COLONOSCOPY WITH PROPOFOL N/A 12/15/2015   Procedure: COLONOSCOPY WITH PROPOFOL;  Surgeon: Manya Silvas, MD;  Location: [facility] ENDOSCOPY;  Service: Endoscopy;  Laterality: N/A;  . KNEE ARTHROSCOPY WITH MEDIAL MENISECTOMY Left 10/31/2016   Procedure: KNEE ARTHROSCOPY WITH MEDIAL MENISECTOMY;  Surgeon: Thornton Park, MD;  Location: [facility] ORS;  Service: Orthopedics;  Laterality: Left;  . TOTAL KNEE ARTHROPLASTY Left 06/13/2017   Procedure: TOTAL KNEE ARTHROPLASTY;  Surgeon: Thornton Park, MD;  Location: [facility] ORS;  Service: Orthopedics;  Laterality: Left;  . TUBAL LIGATION      There were no vitals filed for this visit.  Subjective Assessment - 07/31/17 1443    Subjective  Patient reports she continues to have pain with weight bearing exercises but states her AROM continues to improve. Patient reports no major changes otherwise.     Currently in Pain?  Yes    Pain Score  5     Pain Location  Knee    Pain Orientation  Left    Pain  Descriptors / Indicators  Aching    Pain Type  Surgical pain    Pain Onset  More than a month ago    Pain Frequency  Constant         Manual Therapy: Effleurage to patient's knee in long sitting to decrease increased swelling and edema and STM to patient's anterior thigh over quadriceps musculature to decrease pain in LE.  Knee mobilizations in long sititng P-A grade III-IV to improve mobility - 6 x 30sec    Therapeutic Exercise: Single leg RDL - 2 x 20  TRX squats - x 25 Leg Press in OMEGA single leg - x 20 35#, x 20 25# on L LE  Heel walking in standing down and back - x 10 (~6 in length)  Hip abduction in standing with UE support - x 10     Patient demonstrates increased fatigue at end of the session.   PT Education - 07/31/17 1447    Education provided  Yes    Education Details  single leg RDL in standing     Person(s) Educated  Patient    Methods  Explanation;Demonstration    Comprehension  Verbalized understanding;Returned demonstration          PT Long Term Goals - 07/31/17 1456      PT LONG TERM GOAL #1   Title  Patient will be independent with HEP focused on improving limitations to return to prior level of function.     Baseline  Dependent with form/technique    Time  6    Period  Weeks    Status  On-going      PT LONG TERM GOAL #2   Title  Patient will improve 80mWT to >1.0 m/s without use of AD to demonstrate significant improvement in function    Baseline  .3 m/s with FWW; .71 without AD     Time  6    Period  Weeks    Status  On-going      PT LONG TERM GOAL #3   Title  Pt will demonstrate knee AROM of extension: 0 and flexion >120deg to demonstrate greater ability to walk without increase in pain    Baseline  extension 10 (from neutral); flexion: 80deg; 07/31/17: 6 from neutral, 105 flexion     Time  6    Period  Weeks    Status  On-going      PT LONG TERM GOAL #4   Title  Patient will be able to stand for over 2 hours without AD to 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 Patient demonstrates improvement in knee angle compared the previous session. Patinet demonstrates a knee ext angle of 6 deg from neutral and knee flexion angle of 105 deg. Patient demonstrates improvement with knee function and will benefit from further skilled therapy to return to prior level of function.     Rehab Potential  Good    Clinical Impairments Affecting Rehab Potential  (+) highly motivated, family support (-) Previous knee pain    PT Frequency  2x / week    PT Duration  6 weeks    PT Treatment/Interventions  Dry needling;Taping;Passive range of motion;Manual techniques;Neuromuscular re-education;Gait training;Stair training;Therapeutic activities;Therapeutic exercise;Balance training;Patient/family education;Moist Heat;Ultrasound;Electrical Stimulation;Cryotherapy;Aquatic Therapy;Iontophoresis 4mg /ml Dexamethasone    PT Next Visit Plan  Progress strengthening and AROM    PT Home Exercise Plan  See education section    Consulted and Agree with Plan of Care  Patient       Patient will benefit from skilled therapeutic intervention in order to improve the following deficits and impairments:  Abnormal gait, Increased fascial restricitons, Pain, Increased muscle spasms, Decreased coordination, Decreased endurance, Decreased range of motion, Decreased strength, Decreased balance  Visit Diagnosis: Other lack of coordination - Plan: PT plan of care cert/re-cert  Left knee pain, unspecified chronicity - Plan: PT plan of care cert/re-cert  Stiffness of left knee, not elsewhere classified - Plan: PT plan of care cert/re-cert     Problem List Patient Active Problem List    Diagnosis Date Noted  . S/P total knee arthroplasty, left 06/13/2017  . Bruising 03/25/2017  . Pre-op evaluation 10/25/2016  . Bilateral knee pain 11/01/2015  . Thrombocytopenia ([place]) 11/01/2015  . AP (abdominal pain)   . Diverticulitis large intestine 10/01/2015    Blythe Stanford, PT DPT 07/31/2017, 3:07 PM  [facility] [address]  [place], [place], [address] Phone: [phone]   Fax:  [phone]  Name: Adiba Fargnoli MRN: [id number] Date of Birth: 07/10/1956

## 2017-08-05 ENCOUNTER — Ambulatory Visit: Payer: 59

## 2017-08-05 DIAGNOSIS — M25662 Stiffness of left knee, not elsewhere classified: Secondary | ICD-10-CM

## 2017-08-05 DIAGNOSIS — R278 Other lack of coordination: Secondary | ICD-10-CM | POA: Diagnosis not present

## 2017-08-05 DIAGNOSIS — M79602 Pain in left arm: Secondary | ICD-10-CM | POA: Diagnosis not present

## 2017-08-05 DIAGNOSIS — M25562 Pain in left knee: Secondary | ICD-10-CM | POA: Diagnosis not present

## 2017-08-05 DIAGNOSIS — R2689 Other abnormalities of gait and mobility: Secondary | ICD-10-CM | POA: Diagnosis not present

## 2017-08-05 NOTE — Therapy (Signed)
[facility] [address], [place], [address] Phone: [phone]   Fax:  [phone]  Physical Therapy Treatment  Patient Details  Name: Rebecca Watkins MRN: [id number] Date of Birth: 09-10-1955 Referring Provider: Thornton Park   Encounter Date: 08/05/2017  PT End of Session - 08/05/17 1434    Visit Number  14    Number of Visits  21    Date for PT Re-Evaluation  09/11/17    PT Start Time  2458    PT Stop Time  1430    PT Time Calculation (min)  45 min    Activity Tolerance  Patient tolerated treatment well    Behavior During Therapy  [facility] for tasks assessed/performed       Past Medical History:  Diagnosis Date  . Arthritis    Osteoarthritis  . Breast discharge 04/2017   when expressed  . Diverticulitis 2017  . PONV (postoperative nausea and vomiting)   . Temporary low platelet count (HCC)    H/O    Past Surgical History:  Procedure Laterality Date  . COLONOSCOPY WITH PROPOFOL N/A 12/15/2015   Procedure: COLONOSCOPY WITH PROPOFOL;  Surgeon: Manya Silvas, MD;  Location: [facility] ENDOSCOPY;  Service: Endoscopy;  Laterality: N/A;  . KNEE ARTHROSCOPY WITH MEDIAL MENISECTOMY Left 10/31/2016   Procedure: KNEE ARTHROSCOPY WITH MEDIAL MENISECTOMY;  Surgeon: Thornton Park, MD;  Location: [facility] ORS;  Service: Orthopedics;  Laterality: Left;  . TOTAL KNEE ARTHROPLASTY Left 06/13/2017   Procedure: TOTAL KNEE ARTHROPLASTY;  Surgeon: Thornton Park, MD;  Location: [facility] ORS;  Service: Orthopedics;  Laterality: Left;  . TUBAL LIGATION      There were no vitals filed for this visit.  Subjective Assessment - 08/05/17 1419    Subjective  Paitent reports increased hip soreness after performing hip abduction and hip extension mix. Patinet states her knee continues to feel stiff.    Patient Stated Goals  Return to dialy activities; dance     Currently in Pain?  Yes    Pain Score  5     Pain Location  Knee     Pain Orientation  Left    Pain Descriptors / Indicators  Aching    Pain Type  Surgical pain    Pain Onset  More than a month ago    Pain Frequency  Constant           Manual Therapy: Effleurage to patient's knee in long sitting to decrease increased swelling and edema and STM to patient's anterior thigh over quadriceps musculature to decrease pain in LE.  Knee mobilizations in long sititng P-A grade III-IV to improve mobility - 10 x 30sec    Therapeutic Exercise: Single leg RDL - 2 x 20  TRX squats - x 25;  Lunges in standing - x 20  Hip abduction in standing with UE support - x 10  B; x10 perform 45deg angle between abductionand extension Heel walking in standing down and back - x 10 (~6 in length)   Patient demonstrates increased fatigue at end of the session    PT Education - 08/05/17 1434    Education provided  Yes    Education Details  Hip abduction in standing     Person(s) Educated  Patient    Methods  Explanation;Demonstration    Comprehension  Verbalized understanding;Returned demonstration          PT Long Term Goals - 07/31/17 1456      PT  LONG TERM GOAL #1   Title  Patient will be independent with HEP focused on improving limitations to return to prior level of function.     Baseline  Dependent with form/technique    Time  6    Period  Weeks    Status  On-going      PT LONG TERM GOAL #2   Title  Patient will improve 80mWT to >1.0 m/s without use of AD to demonstrate significant improvement in function    Baseline  .3 m/s with FWW; .71 without AD     Time  6    Period  Weeks    Status  On-going      PT LONG TERM GOAL #3   Title  Pt will demonstrate knee AROM of extension: 0 and flexion >120deg to demonstrate greater ability to walk without increase in pain    Baseline  extension 10 (from neutral); flexion: 80deg; 07/31/17: 6 from neutral, 105 flexion     Time  6    Period  Weeks    Status  On-going      PT LONG TERM GOAL #4   Title  Patient  will be able to stand for over 2 hours without AD 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 Patient demonstrates poor hip posiitoning in single leg stance with a positive trendelenburg and poor hip musuclar strength/control. Patient will benefit from further skilled therapy to return to prior level of function.     Rehab Potential  Good    Clinical Impairments Affecting Rehab Potential  (+) highly motivated, family support (-) Previous knee pain    PT Frequency  2x / week    PT Duration  6 weeks    PT Treatment/Interventions  Dry needling;Taping;Passive range of motion;Manual techniques;Neuromuscular re-education;Gait training;Stair training;Therapeutic activities;Therapeutic exercise;Balance training;Patient/family education;Moist Heat;Ultrasound;Electrical Stimulation;Cryotherapy;Aquatic Therapy;Iontophoresis 4mg /ml Dexamethasone    PT Next Visit Plan  Progress strengthening and AROM    PT Home Exercise Plan  See education section    Consulted and Agree with Plan of Care  Patient       Patient will benefit from skilled therapeutic intervention in order to improve the following deficits and impairments:  Abnormal gait, Increased fascial restricitons, Pain, Increased muscle spasms, Decreased coordination, Decreased endurance, Decreased range of motion, Decreased strength, Decreased balance  Visit Diagnosis: Other lack of coordination  Left knee pain, unspecified chronicity  Stiffness of left knee, not elsewhere classified     Problem List Patient Active Problem List   Diagnosis Date Noted  . S/P total knee arthroplasty, left  06/13/2017  . Bruising 03/25/2017  . Pre-op evaluation 10/25/2016  . Bilateral knee pain 11/01/2015  . Thrombocytopenia ([place]) 11/01/2015  . AP (abdominal pain)   . Diverticulitis large intestine 10/01/2015    Blythe Stanford, PT DPT 08/05/2017, 3:25 PM  [facility] [address], [place], [address] Phone: [phone]   Fax:  [phone]  Name: Rebecca Watkins MRN: [id number] Date of Birth: 05/22/1956

## 2017-08-07 ENCOUNTER — Ambulatory Visit: Payer: 59

## 2017-08-07 DIAGNOSIS — R2689 Other abnormalities of gait and mobility: Secondary | ICD-10-CM | POA: Diagnosis not present

## 2017-08-07 DIAGNOSIS — M25662 Stiffness of left knee, not elsewhere classified: Secondary | ICD-10-CM

## 2017-08-07 DIAGNOSIS — R278 Other lack of coordination: Secondary | ICD-10-CM

## 2017-08-07 DIAGNOSIS — M25562 Pain in left knee: Secondary | ICD-10-CM

## 2017-08-07 DIAGNOSIS — M79602 Pain in left arm: Secondary | ICD-10-CM | POA: Diagnosis not present

## 2017-08-07 NOTE — Therapy (Signed)
[facility] [address] [place], [address] Phone: [phone]   Fax:  [phone]  Physical Therapy Treatment  Patient Details  Name: Rebecca Watkins MRN: [id number] Date of Birth: 07-05-1956 Referring Provider: Thornton Park   Encounter Date: 08/07/2017  PT End of Session - 08/07/17 1559    Visit Number  15    Number of Visits  21    Date for PT Re-Evaluation  09/11/17    PT Start Time  1430    PT Stop Time  1515    PT Time Calculation (min)  45 min    Activity Tolerance  Patient tolerated treatment well    Behavior During Therapy  [facility] for tasks assessed/performed       Past Medical History:  Diagnosis Date  . Arthritis    Osteoarthritis  . Breast discharge 04/2017   when expressed  . Diverticulitis 2017  . PONV (postoperative nausea and vomiting)   . Temporary low platelet count (HCC)    H/O    Past Surgical History:  Procedure Laterality Date  . COLONOSCOPY WITH PROPOFOL N/A 12/15/2015   Procedure: COLONOSCOPY WITH PROPOFOL;  Surgeon: Manya Silvas, MD;  Location: [facility] ENDOSCOPY;  Service: Endoscopy;  Laterality: N/A;  . KNEE ARTHROSCOPY WITH MEDIAL MENISECTOMY Left 10/31/2016   Procedure: KNEE ARTHROSCOPY WITH MEDIAL MENISECTOMY;  Surgeon: Thornton Park, MD;  Location: [facility] ORS;  Service: Orthopedics;  Laterality: Left;  . TOTAL KNEE ARTHROPLASTY Left 06/13/2017   Procedure: TOTAL KNEE ARTHROPLASTY;  Surgeon: Thornton Park, MD;  Location: [facility] ORS;  Service: Orthopedics;  Laterality: Left;  . TUBAL LIGATION      There were no vitals filed for this visit.  Subjective Assessment - 08/07/17 1556    Subjective  Patient reports she's been performing knee exercises at home. Patient reports increased pain when performing a lot of weight bearing exercises.     Patient Stated Goals  Return to dialy activities; dance     Currently in Pain?  Yes    Pain Score  3     Pain Location  Knee     Pain Orientation  Left    Pain Descriptors / Indicators  Aching    Pain Type  Surgical pain    Pain Onset  More than a month ago    Pain Frequency  Constant         TREATMENT   Manual Therapy: Mobilizations Tibia on femoral P-A pressure to improve joint positioning 6 x 30sec with patient positioned in prone grade IV, STM performed to patient's hamstrings and quadriceps to decreased increased spasms and pain to encourage further knee motion.   Therapeutic Exercise: Heel walking at the side of the treadmill - 10 x 49ft with intermittent UE support Single leg RDL - x25 on the L LE to improve Hamstring and encourage knee extension Squats with TRX - x 20 performed throughout full AROM to improve knee AROM   Patient demonstrates increased fatigue at end of the session    PT Education - 08/07/17 1558    Education provided  Yes    Education Details  Review HEP: exercises    Person(s) Educated  Patient    Methods  Explanation;Demonstration    Comprehension  Verbalized understanding;Returned demonstration          PT Long Term Goals - 07/31/17 1456      PT LONG TERM GOAL #1   Title  Patient will be independent with  HEP focused on improving limitations to return to prior level of function.     Baseline  Dependent with form/technique    Time  6    Period  Weeks    Status  On-going      PT LONG TERM GOAL #2   Title  Patient will improve 79mWT to >1.0 m/s without use of AD to demonstrate significant improvement in function    Baseline  .3 m/s with FWW; .71 without AD     Time  6    Period  Weeks    Status  On-going      PT LONG TERM GOAL #3   Title  Pt will demonstrate knee AROM of extension: 0 and flexion >120deg to demonstrate greater ability to walk without increase in pain    Baseline  extension 10 (from neutral); flexion: 80deg; 07/31/17: 6 from neutral, 105 flexion     Time  6    Period  Weeks    Status  On-going      PT LONG TERM GOAL #4   Title  Patient will be  able to stand for over 2 hours without 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 Reviewed knee exercises to be performed while on Christmas to continue benefits of therapy. Stressed importance of performance of knee extension. Patient will benefit from further skilled therapy to return to prior level of function.    Rehab Potential  Good    Clinical Impairments Affecting Rehab Potential  (+) highly motivated, family support (-) Previous knee pain    PT Frequency  2x / week    PT Duration  6 weeks    PT Treatment/Interventions  Dry needling;Taping;Passive range of motion;Manual techniques;Neuromuscular re-education;Gait training;Stair training;Therapeutic activities;Therapeutic exercise;Balance training;Patient/family education;Moist Heat;Ultrasound;Electrical Stimulation;Cryotherapy;Aquatic Therapy;Iontophoresis 4mg /ml Dexamethasone    PT Next Visit Plan  Progress strengthening and AROM    PT Home Exercise Plan  See education section    Consulted and Agree with Plan of Care  Patient       Patient will benefit from skilled therapeutic intervention in order to improve the following deficits and impairments:  Abnormal gait, Increased fascial restricitons, Pain, Increased muscle spasms, Decreased coordination, Decreased endurance, Decreased range of motion, Decreased strength, Decreased balance  Visit Diagnosis: Other lack of coordination  Left knee pain, unspecified chronicity  Stiffness of left knee, not elsewhere classified     Problem List Patient Active Problem List   Diagnosis Date Noted  . S/P total knee arthroplasty, left 06/13/2017  .  Bruising 03/25/2017  . Pre-op evaluation 10/25/2016  . Bilateral knee pain 11/01/2015  . Thrombocytopenia ([place]) 11/01/2015  . AP (abdominal pain)   . Diverticulitis large intestine 10/01/2015    Blythe Stanford, PT DPT 08/07/2017, 5:25 PM  [facility] [address], [place], [address] Phone: [phone]   Fax:  [phone]  Name: Rebecca Watkins MRN: [id number] Date of Birth: 01/13/1956

## 2017-08-13 ENCOUNTER — Telehealth: Payer: Self-pay | Admitting: Family Medicine

## 2017-08-13 MED ORDER — OSELTAMIVIR PHOSPHATE 75 MG PO CAPS: 75.0000 mg | ORAL_CAPSULE | Freq: Every day | ORAL | 0 refills | Status: DC

## 2017-08-13 NOTE — Telephone Encounter (Signed)
CRM for notification. See Telephone encounter for:   08/13/17.   Relation to pt: self  Call back number: [phone]  Pharmacy: [facility], [place] [phone] (Phone) [phone] (Fax)  Reason for call:  Patient spouse [place], [place] was seen by Volney American, PA-C today and dx with the flu, Orene Desanctis, [place] advised patient to contact PCP to request Tami flu, please advise

## 2017-08-13 NOTE — Telephone Encounter (Signed)
Please advise 

## 2017-08-13 NOTE — Telephone Encounter (Signed)
Left message to return call, ok for P[facility] to  speak to patient  

## 2017-08-13 NOTE — Telephone Encounter (Signed)
Noted. Tamiflu sent to pharmacy. If the patient develops symptoms she should contact us. Thanks.

## 2017-08-18 ENCOUNTER — Ambulatory Visit: Payer: 59

## 2017-08-18 DIAGNOSIS — M25562 Pain in left knee: Secondary | ICD-10-CM | POA: Diagnosis not present

## 2017-08-18 DIAGNOSIS — R278 Other lack of coordination: Secondary | ICD-10-CM

## 2017-08-18 DIAGNOSIS — M25662 Stiffness of left knee, not elsewhere classified: Secondary | ICD-10-CM | POA: Diagnosis not present

## 2017-08-18 DIAGNOSIS — R2689 Other abnormalities of gait and mobility: Secondary | ICD-10-CM | POA: Diagnosis not present

## 2017-08-18 DIAGNOSIS — M79602 Pain in left arm: Secondary | ICD-10-CM | POA: Diagnosis not present

## 2017-08-18 NOTE — Therapy (Signed)
[facility] [address], [place], [address] Phone: [phone]   Fax:  [phone]  Physical Therapy Treatment  Patient Details  Name: Rebecca Watkins MRN: [id number] Date of Birth: [birth year] Referring Provider: Thornton Park   Encounter Date: 08/18/2017  PT End of Session - 08/18/17 0939    Visit Number  16    Number of Visits  21    Date for PT Re-Evaluation  09/11/17    PT Start Time  0939    PT Stop Time  1021    PT Time Calculation (min)  42 min    Activity Tolerance  Patient tolerated treatment well    Behavior During Therapy  [facility] for tasks assessed/performed       Past Medical History:  Diagnosis Date  . Arthritis    Osteoarthritis  . Breast discharge 04/2017   when expressed  . Diverticulitis 2017  . PONV (postoperative nausea and vomiting)   . Temporary low platelet count (HCC)    H/O    Past Surgical History:  Procedure Laterality Date  . COLONOSCOPY WITH PROPOFOL N/A 12/15/2015   Procedure: COLONOSCOPY WITH PROPOFOL;  Surgeon: Manya Silvas, MD;  Location: [facility] ENDOSCOPY;  Service: Endoscopy;  Laterality: N/A;  . KNEE ARTHROSCOPY WITH MEDIAL MENISECTOMY Left 10/31/2016   Procedure: KNEE ARTHROSCOPY WITH MEDIAL MENISECTOMY;  Surgeon: Thornton Park, MD;  Location: [facility] ORS;  Service: Orthopedics;  Laterality: Left;  . TOTAL KNEE ARTHROPLASTY Left 06/13/2017   Procedure: TOTAL KNEE ARTHROPLASTY;  Surgeon: Thornton Park, MD;  Location: [facility] ORS;  Service: Orthopedics;  Laterality: Left;  . TUBAL LIGATION      There were no vitals filed for this visit.  Subjective Assessment - 08/18/17 0940    Subjective  L knee is ok. Took pain medicine before coming to PT. No pain currenyty. Just tightness.     Patient Stated Goals  Return to dialy activities; dance     Currently in Pain?  No/denies    Pain Onset  More than a month ago                               PT Education - 08/18/17 1014    Education provided  Yes    Education Details  ther-ex    [employer]) Educated  Patient    Methods  Explanation;Demonstration;Tactile cues;Verbal cues    Comprehension  Returned demonstration;Verbalized understanding        Objectives  Seated L knee AROM at start of session:    -15 to 101 degres  Manual therapy   Supine STM to lateral and medial hamstrings Seated muscle energy technique to promote L knee extension AROM  Improved L seated L knee extension AROM to -13 degrees Seated STM to L quadriceps Seated STM to scar tissue area  imprved seated L knee flexion AROM to 107 degrees    Ther-ex Supine quad sets following supine STM to hamstrings 10x2 with 5 second holds to promote knee extension  Heel walking at the side of the treadmill - 10 x 70ft with intermittent UE support  Lunges in standing - x 20   Hip abduction in standing with UE support - x 10 B. Pt states this helps with her hip pain.     Improved exercise technique, movement at target joints, use of target muscles after min to mod verbal, visual, tactile cues.    Improved L  knee AROM following manual therapy to promote hamstring, quadriceps, and scar tissue mobility. Continued working exercises to pormote knee extension and hip strengthening to promote ability to perform functional tasks. Pt tolerated session well without aggravation of symptoms.          PT Long Term Goals - 07/31/17 1456      PT LONG TERM GOAL #1   Title  Patient will be independent with HEP focused on improving limitations to return to prior level of function.     Baseline  Dependent with form/technique    Time  6    Period  Weeks    Status  On-going      PT LONG TERM GOAL #2   Title  Patient will improve 68mWT to >1.0 m/s without use of AD to demonstrate significant improvement in function    Baseline  .3 m/s with FWW; .71 without AD     Time  6     Period  Weeks    Status  On-going      PT LONG TERM GOAL #3   Title  Pt will demonstrate knee AROM of extension: 0 and flexion >120deg to demonstrate greater ability to walk without increase in pain    Baseline  extension 10 (from neutral); flexion: 80deg; 07/31/17: 6 from neutral, 105 flexion     Time  6    Period  Weeks    Status  On-going      PT LONG TERM GOAL #4   Title  Patient will be able to stand for over 2 hours without AD 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 Continued working exercises to pormote knee extension and hip strengthening to promote ability to perform functional tasks. Pt tolerated session well without aggravation of symptoms.     History and Personal Factors relevant to plan of care:  Pt had chronic knee pain before surgery    Clinical Presentation  Stable    Clinical Presentation due to:  Improved knee ROM after manual therapy    Clinical Decision Making  Low    Rehab Potential  Good    Clinical Impairments Affecting Rehab Potential  (+) highly motivated, family support (-) Previous knee pain    PT Frequency  2x / week    PT Duration  6 weeks    PT Treatment/Interventions  Dry needling;Taping;Passive range of motion;Manual techniques;Neuromuscular re-education;Gait training;Stair training;Therapeutic activities;Therapeutic exercise;Balance training;Patient/family education;Moist Heat;Ultrasound;Electrical Stimulation;Cryotherapy;Aquatic Therapy;Iontophoresis 4mg /ml Dexamethasone    PT Next Visit Plan  Progress strengthening and AROM    PT Home Exercise Plan  See education section    Consulted and Agree with Plan of Care  Patient       Patient will  benefit from skilled therapeutic intervention in order to improve the following deficits and impairments:  Abnormal gait, Increased fascial restricitons, Pain, Increased muscle spasms, Decreased coordination, Decreased endurance, Decreased range of motion, Decreased strength, Decreased balance  Visit Diagnosis: Other lack of coordination  Left knee pain, unspecified chronicity  Stiffness of left knee, not elsewhere classified  Problem List Patient Active Problem List   Diagnosis Date Noted  . S/P total knee arthroplasty, left 06/13/2017  . Bruising 03/25/2017  . Pre-op evaluation 10/25/2016  . Bilateral knee pain 11/01/2015  . Thrombocytopenia ([place]) 11/01/2015  . AP (abdominal pain)   . Diverticulitis large intestine 10/01/2015    Joneen Boers PT, DPT   08/18/2017, 1:07 PM  [facility] [address] [place], [address] Phone: [phone]   Fax:  [phone]  Name: Rebecca Watkins MRN: [id number] Date of Birth: [birth year]

## 2017-08-20 ENCOUNTER — Ambulatory Visit: Payer: 59 | Attending: Orthopedic Surgery

## 2017-08-20 DIAGNOSIS — R278 Other lack of coordination: Secondary | ICD-10-CM | POA: Insufficient documentation

## 2017-08-20 DIAGNOSIS — M25562 Pain in left knee: Secondary | ICD-10-CM | POA: Insufficient documentation

## 2017-08-20 DIAGNOSIS — M25662 Stiffness of left knee, not elsewhere classified: Secondary | ICD-10-CM | POA: Insufficient documentation

## 2017-08-20 NOTE — Therapy (Signed)
[facility] [address] [place], [address] Phone: [phone]   Fax:  [phone]  Physical Therapy Treatment  Patient Details  Name: Rebecca Watkins MRN: [id number] Date of Birth: 1956/04/16 Referring Provider: Thornton Park   Encounter Date: 08/20/2017  PT End of Session - 08/20/17 1456    Visit Number  17    Number of Visits  21    Date for PT Re-Evaluation  09/11/17    PT Start Time  0240    PT Stop Time  1430    PT Time Calculation (min)  45 min    Activity Tolerance  Patient tolerated treatment well    Behavior During Therapy  [facility] for tasks assessed/performed       Past Medical History:  Diagnosis Date  . Arthritis    Osteoarthritis  . Breast discharge 04/2017   when expressed  . Diverticulitis 2017  . PONV (postoperative nausea and vomiting)   . Temporary low platelet count (HCC)    H/O    Past Surgical History:  Procedure Laterality Date  . COLONOSCOPY WITH PROPOFOL N/A 12/15/2015   Procedure: COLONOSCOPY WITH PROPOFOL;  Surgeon: Manya Silvas, MD;  Location: [facility] ENDOSCOPY;  Service: Endoscopy;  Laterality: N/A;  . KNEE ARTHROSCOPY WITH MEDIAL MENISECTOMY Left 10/31/2016   Procedure: KNEE ARTHROSCOPY WITH MEDIAL MENISECTOMY;  Surgeon: Thornton Park, MD;  Location: [facility] ORS;  Service: Orthopedics;  Laterality: Left;  . TOTAL KNEE ARTHROPLASTY Left 06/13/2017   Procedure: TOTAL KNEE ARTHROPLASTY;  Surgeon: Thornton Park, MD;  Location: [facility] ORS;  Service: Orthopedics;  Laterality: Left;  . TUBAL LIGATION      There were no vitals filed for this visit.  Subjective Assessment - 08/20/17 1450    Subjective  Patient reports she has been performing her exercises at home. Patient states no current pain in the knee and states her knee has been feeling better.     Patient Stated Goals  Return to dialy activities; dance     Currently in Pain?  No/denies    Pain Onset  More than a  month ago       TREATMENT    Manual Therapy: Mobilizations Tibia on femoral P-A pressure to improve joint positioning 4 x 30sec with patient positioned in prone grade IV, STM performed to patient's hamstrings and quadriceps to decreased increased spasms and pain to encourage further knee motion.    Therapeutic Exercise: Step ups with 6" step - x 25  Heel taps off of 3" step - x 25 Lunges in standing onto a step - x30 Single leg RDL - x25 on the L LE to improve Hamstring and encourage knee extension Heel raises off of step - x 20 TRX squats in standing - x 20    Patient demonstrates increased fatigue at end of the session    PT Education - 08/20/17 1453    Education provided  Yes    Education Details  form/technique with exercise    Person(s) Educated  Patient    Methods  Explanation;Demonstration    Comprehension  Verbalized understanding;Returned demonstration          PT Long Term Goals - 07/31/17 1456      PT LONG TERM GOAL #1   Title  Patient will be independent with HEP focused on improving limitations to return to prior level of function.     Baseline  Dependent with form/technique    Time  6  Period  Weeks    Status  On-going      PT LONG TERM GOAL #2   Title  Patient will improve 2mWT to >1.0 m/s without use of AD to demonstrate significant improvement in function    Baseline  .3 m/s with FWW; .71 without AD     Time  6    Period  Weeks    Status  On-going      PT LONG TERM GOAL #3   Title  Pt will demonstrate knee AROM of extension: 0 and flexion >120deg to demonstrate greater ability to walk without increase in pain    Baseline  extension 10 (from neutral); flexion: 80deg; 07/31/17: 6 from neutral, 105 flexion     Time  6    Period  Weeks    Status  On-going      PT LONG TERM GOAL #4   Title  Patient will be able to stand for over 2 hours without 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 Patient demonstrates improvement in knee angle. Patient demonstrates poor hip control with exercise performance with improvement after performing tactile cueing. Patient will benefit from further skilled therapy to return to prior level of function.     Rehab Potential  Good    Clinical Impairments Affecting Rehab Potential  (+) highly motivated, family support (-) Previous knee pain    PT Frequency  2x / week    PT Duration  6 weeks    PT Treatment/Interventions  Dry needling;Taping;Passive range of motion;Manual techniques;Neuromuscular re-education;Gait training;Stair training;Therapeutic activities;Therapeutic exercise;Balance training;Patient/family education;Moist Heat;Ultrasound;Electrical Stimulation;Cryotherapy;Aquatic Therapy;Iontophoresis 4mg /ml Dexamethasone    PT Next Visit Plan  Progress strengthening and AROM    PT Home Exercise Plan  See education section    Consulted and Agree with Plan of Care  Patient       Patient will benefit from skilled therapeutic intervention in order to improve the following deficits and impairments:  Abnormal gait, Increased fascial restricitons, Pain, Increased muscle spasms, Decreased coordination, Decreased endurance, Decreased range of motion, Decreased strength, Decreased balance  Visit Diagnosis: Other lack of coordination  Left knee pain, unspecified chronicity  Stiffness of left knee, not elsewhere classified     Problem List Patient Active Problem List   Diagnosis Date Noted  . S/P total knee arthroplasty, left 06/13/2017  . Bruising 03/25/2017  . Pre-op evaluation 10/25/2016  . Bilateral knee pain 11/01/2015  . Thrombocytopenia ([place]) 11/01/2015  . AP (abdominal pain)   . Diverticulitis  large intestine 10/01/2015    Blythe Stanford, PT DPT 08/20/2017, 3:24 PM  [facility] [address] [place], [address] Phone: [phone]   Fax:  [phone]  Name: Kataleyah Carducci MRN: [id number] Date of Birth: 1956/05/08

## 2017-08-25 ENCOUNTER — Ambulatory Visit: Payer: 59

## 2017-08-25 DIAGNOSIS — M25562 Pain in left knee: Secondary | ICD-10-CM | POA: Diagnosis not present

## 2017-08-25 DIAGNOSIS — R278 Other lack of coordination: Secondary | ICD-10-CM

## 2017-08-25 DIAGNOSIS — M25662 Stiffness of left knee, not elsewhere classified: Secondary | ICD-10-CM

## 2017-08-25 NOTE — Therapy (Signed)
[facility] [address] [place], [address] Phone: [phone]   Fax:  [phone]  Physical Therapy Treatment  Patient Details  Name: Rebecca Watkins MRN: [id number] Date of Birth: [birth year] Referring Provider: Thornton Park   Encounter Date: 08/25/2017  PT End of Session - 08/25/17 1738    Visit Number  18    Number of Visits  21    Date for PT Re-Evaluation  09/11/17    PT Start Time  8546    PT Stop Time  1600    PT Time Calculation (min)  45 min    Activity Tolerance  Patient tolerated treatment well    Behavior During Therapy  [facility] for tasks assessed/performed       Past Medical History:  Diagnosis Date  . Arthritis    Osteoarthritis  . Breast discharge 04/2017   when expressed  . Diverticulitis 2017  . PONV (postoperative nausea and vomiting)   . Temporary low platelet count (HCC)    H/O    Past Surgical History:  Procedure Laterality Date  . COLONOSCOPY WITH PROPOFOL N/A 12/15/2015   Procedure: COLONOSCOPY WITH PROPOFOL;  Surgeon: Manya Silvas, MD;  Location: [facility] ENDOSCOPY;  Service: Endoscopy;  Laterality: N/A;  . KNEE ARTHROSCOPY WITH MEDIAL MENISECTOMY Left 10/31/2016   Procedure: KNEE ARTHROSCOPY WITH MEDIAL MENISECTOMY;  Surgeon: Thornton Park, MD;  Location: [facility] ORS;  Service: Orthopedics;  Laterality: Left;  . TOTAL KNEE ARTHROPLASTY Left 06/13/2017   Procedure: TOTAL KNEE ARTHROPLASTY;  Surgeon: Thornton Park, MD;  Location: [facility] ORS;  Service: Orthopedics;  Laterality: Left;  . TUBAL LIGATION      There were no vitals filed for this visit.  Subjective Assessment - 08/25/17 1626    Subjective  Patient reports increased pain after driving in the car for 5 hours which caused increased pain for the following day. Patient reports her knee is starting to feel better today.     Patient Stated Goals  Return to dialy activities; dance     Currently in Pain?  No/denies     Pain Onset  More than a month ago         TREATMENT    Manual Therapy: Mobilizations Tibia on femoral P-A pressure to improve joint positioning 4 x 30sec with patient positioned in sitting grade IV, STM performed to patient's hamstrings and quadriceps to decreased increased spasms and pain to encourage further knee motion.    Therapeutic Exercise: Single leg RDL - x25 on the L LE to improve Hamstring and encourage knee extension, Single leg RDL on mini ramp - 2 x 15, x20 with mini ramp and 10# weight Hip Abduction in stand at hip machine - 2 x 12 25#  TRX squats in standing - x 20 Leg Press - B LE - x 20 55#; Single leg LE (on L only) 30#.     Patient demonstrates increased fatigue at end of the session   PT Education - 08/25/17 1738    Education provided  Yes    Education Details  form/technique with exercise    Person(s) Educated  Patient    Methods  Explanation;Demonstration    Comprehension  Verbalized understanding;Returned demonstration          PT Long Term Goals - 07/31/17 1456      PT LONG TERM GOAL #1   Title  Patient will be independent with HEP focused on improving limitations to return to prior level of  function.     Baseline  Dependent with form/technique    Time  6    Period  Weeks    Status  On-going      PT LONG TERM GOAL #2   Title  Patient will improve 5mWT to >1.0 m/s without use of AD to demonstrate significant improvement in function    Baseline  .3 m/s with FWW; .71 without AD     Time  6    Period  Weeks    Status  On-going      PT LONG TERM GOAL #3   Title  Pt will demonstrate knee AROM of extension: 0 and flexion >120deg to demonstrate greater ability to walk without increase in pain    Baseline  extension 10 (from neutral); flexion: 80deg; 07/31/17: 6 from neutral, 105 flexion     Time  6    Period  Weeks    Status  On-going      PT LONG TERM GOAL #4   Title  Patient will be able to stand 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 Patient demonstrates improvement in knee extension after performing exercises indicating functional carryover. However, Patient continues to have decreased AROM and patient will benefit from further skilled therapy to return to prior level of function.      Rehab Potential  Good    Clinical Impairments Affecting Rehab Potential  (+) highly motivated, family support (-) Previous knee pain    PT Frequency  2x / week    PT Duration  6 weeks    PT Treatment/Interventions  Dry needling;Taping;Passive range of motion;Manual techniques;Neuromuscular re-education;Gait training;Stair training;Therapeutic activities;Therapeutic exercise;Balance training;Patient/family education;Moist Heat;Ultrasound;Electrical Stimulation;Cryotherapy;Aquatic Therapy;Iontophoresis 4mg /ml Dexamethasone    PT Next Visit Plan  Progress strengthening and AROM    PT Home Exercise Plan  See education section    Consulted and Agree with Plan of Care  Patient       Patient will benefit from skilled therapeutic intervention in order to improve the following deficits and impairments:  Abnormal gait, Increased fascial restricitons, Pain, Increased muscle spasms, Decreased coordination, Decreased endurance, Decreased range of motion, Decreased strength, Decreased balance, Decreased mobility  Visit Diagnosis: Other lack of coordination  Left knee pain, unspecified chronicity  Stiffness of left knee, not elsewhere classified     Problem List Patient Active Problem List   Diagnosis Date Noted  . S/P total knee arthroplasty, left 06/13/2017  . Bruising 03/25/2017  . Pre-op evaluation 10/25/2016   . Bilateral knee pain 11/01/2015  . Thrombocytopenia ([place]) 11/01/2015  . AP (abdominal pain)   . Diverticulitis large intestine 10/01/2015    Blythe Stanford, PT DPT 08/25/2017, 6:10 PM  [facility] [facility] [address] [place], [address] Phone: [phone]   Fax:  [phone]  Name: Rebecca Watkins MRN: [id number] Date of Birth: February 28, 1956

## 2017-08-28 ENCOUNTER — Ambulatory Visit: Payer: 59

## 2017-08-28 DIAGNOSIS — M25662 Stiffness of left knee, not elsewhere classified: Secondary | ICD-10-CM

## 2017-08-28 DIAGNOSIS — R278 Other lack of coordination: Secondary | ICD-10-CM | POA: Diagnosis not present

## 2017-08-28 DIAGNOSIS — M25562 Pain in left knee: Secondary | ICD-10-CM | POA: Diagnosis not present

## 2017-08-28 NOTE — Therapy (Signed)
[place] [facility] [address], [place], [address] Phone: [phone]   Fax:  [phone]  Physical Therapy Treatment  Patient Details  Name: Rebecca Watkins MRN: [id number] Date of Birth: 1955/08/28 Referring Provider: Thornton Park   Encounter Date: 08/28/2017  PT End of Session - 08/28/17 1619    Visit Number  19    Number of Visits  21    Date for PT Re-Evaluation  09/11/17    PT Start Time  1500    PT Stop Time  1545    PT Time Calculation (min)  45 min    Activity Tolerance  Patient tolerated treatment well    Behavior During Therapy  [facility] for tasks assessed/performed       Past Medical History:  Diagnosis Date  . Arthritis    Osteoarthritis  . Breast discharge 04/2017   when expressed  . Diverticulitis 2017  . PONV (postoperative nausea and vomiting)   . Temporary low platelet count (HCC)    H/O    Past Surgical History:  Procedure Laterality Date  . COLONOSCOPY WITH PROPOFOL N/A 12/15/2015   Procedure: COLONOSCOPY WITH PROPOFOL;  Surgeon: Manya Silvas, MD;  Location: [facility] ENDOSCOPY;  Service: Endoscopy;  Laterality: N/A;  . KNEE ARTHROSCOPY WITH MEDIAL MENISECTOMY Left 10/31/2016   Procedure: KNEE ARTHROSCOPY WITH MEDIAL MENISECTOMY;  Surgeon: Thornton Park, MD;  Location: [facility] ORS;  Service: Orthopedics;  Laterality: Left;  . TOTAL KNEE ARTHROPLASTY Left 06/13/2017   Procedure: TOTAL KNEE ARTHROPLASTY;  Surgeon: Thornton Park, MD;  Location: [facility] ORS;  Service: Orthopedics;  Laterality: Left;  . TUBAL LIGATION      There were no vitals filed for this visit.  Subjective Assessment - 08/28/17 1553    Subjective  Patient reports continues increased pain along her knee joint from the long drive the other day. Patient reports her knee is not feeling any better since the previous visit.     Patient Stated Goals  Return to dialy activities; dance     Currently in Pain?  Yes    Pain  Score  5     Pain Location  Knee    Pain Orientation  Left    Pain Descriptors / Indicators  Aching    Pain Type  Surgical pain    Pain Onset  More than a month ago       TREATMENT:  Therapeutic Exercise Toe flexion with YTB -- x 15  Greater toe flexion with YTB -- x 15 Toe extension with YTB -- x 15 Great extension with YTB  -- x15 Isometric toe flexion -- x 10 5sec Isometric toe extension -- x 10 5sec   Manual therapy: STM to patients toe extensors and flexors along the lateral and anterior aspect of the lower leg to decrease increased muscular spasms and pain   Patient demonstrate decreased pain at the end of the session     PT Education - 08/28/17 1617    Education provided  Yes    Education Details  HEP: toe ext/flexion with YTB    Person(s) Educated  Patient    Methods  Explanation;Demonstration    Comprehension  Verbalized understanding;Returned demonstration          PT Long Term Goals - 07/31/17 1456      PT LONG TERM GOAL #1   Title  Patient will be independent with HEP focused on improving limitations to return to prior level of function.  Baseline  Dependent with form/technique    Time  6    Period  Weeks    Status  On-going      PT LONG TERM GOAL #2   Title  Patient will improve 20mWT to >1.0 m/s without use of AD to demonstrate significant improvement in function    Baseline  .3 m/s with FWW; .71 without AD     Time  6    Period  Weeks    Status  On-going      PT LONG TERM GOAL #3   Title  Pt will demonstrate knee AROM of extension: 0 and flexion >120deg to demonstrate greater ability to walk without increase in pain    Baseline  extension 10 (from neutral); flexion: 80deg; 07/31/17: 6 from neutral, 105 flexion     Time  6    Period  Weeks    Status  On-going      PT LONG TERM GOAL #4   Title  Patient will be able to stand for over 2 hours without AD 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 Patient demonstrates improvement in pain and spasms after performing STM and isometric holds. Patient will benefit form further skilled therapy to return to prior level of function.     Rehab Potential  Good    Clinical Impairments Affecting Rehab Potential  (+) highly motivated, family support (-) Previous knee pain    PT Frequency  2x / week    PT Duration  6 weeks    PT Treatment/Interventions  Dry needling;Taping;Passive range of motion;Manual techniques;Neuromuscular re-education;Gait training;Stair training;Therapeutic activities;Therapeutic exercise;Balance training;Patient/family education;Moist Heat;Ultrasound;Electrical Stimulation;Cryotherapy;Aquatic Therapy;Iontophoresis 4mg /ml Dexamethasone    PT Next Visit Plan  Progress strengthening and AROM    PT Home Exercise Plan  See education section    Consulted and Agree with Plan of Care  Patient       Patient will benefit from skilled therapeutic intervention in order to improve the following deficits and impairments:  Abnormal gait, Increased fascial restricitons, Pain, Increased muscle spasms, Decreased coordination, Decreased endurance, Decreased range of motion, Decreased strength, Decreased balance, Decreased mobility  Visit Diagnosis: Other lack of coordination  Left knee pain, unspecified chronicity  Stiffness of left knee, not elsewhere classified     Problem List Patient Active Problem List   Diagnosis Date Noted  . S/P total knee arthroplasty, left 06/13/2017  . Bruising 03/25/2017  . Pre-op evaluation 10/25/2016  . Bilateral knee pain 11/01/2015  . Thrombocytopenia ([place]) 11/01/2015  . AP (abdominal pain)    . Diverticulitis large intestine 10/01/2015    Blythe Stanford, PT DPT 08/28/2017, 4:26 PM  [place] [facility] [address] [place], [address] Phone: [phone]   Fax:  [phone]  Name: Rebecca Watkins MRN: [id number] Date of Birth: 07-08-1956

## 2017-09-01 ENCOUNTER — Ambulatory Visit: Payer: 59

## 2017-09-01 DIAGNOSIS — M25562 Pain in left knee: Secondary | ICD-10-CM

## 2017-09-01 DIAGNOSIS — R278 Other lack of coordination: Secondary | ICD-10-CM

## 2017-09-01 DIAGNOSIS — M25662 Stiffness of left knee, not elsewhere classified: Secondary | ICD-10-CM

## 2017-09-01 NOTE — Therapy (Addendum)
[place] [facility] [address] [place], [address] Phone: [phone]   Fax:  [phone]  Physical Therapy Treatment  Patient Details  Name: Rebecca Watkins MRN: [id number] Date of Birth: 12/27/1955 Referring Provider: Thornton Park   Encounter Date: 09/01/2017  PT End of Session - 09/01/17 1627    Visit Number  20    Number of Visits  21    Date for PT Re-Evaluation  09/11/17    PT Start Time  1430    PT Stop Time  1540    PT Time Calculation (min)  70 min    Activity Tolerance  Patient tolerated treatment well    Behavior During Therapy  [facility] for tasks assessed/performed       Past Medical History:  Diagnosis Date  . Arthritis    Osteoarthritis  . Breast discharge 04/2017   when expressed  . Diverticulitis 2017  . PONV (postoperative nausea and vomiting)   . Temporary low platelet count (HCC)    H/O    Past Surgical History:  Procedure Laterality Date  . COLONOSCOPY WITH PROPOFOL N/A 12/15/2015   Procedure: COLONOSCOPY WITH PROPOFOL;  Surgeon: Manya Silvas, MD;  Location: [facility] ENDOSCOPY;  Service: Endoscopy;  Laterality: N/A;  . KNEE ARTHROSCOPY WITH MEDIAL MENISECTOMY Left 10/31/2016   Procedure: KNEE ARTHROSCOPY WITH MEDIAL MENISECTOMY;  Surgeon: Thornton Park, MD;  Location: [facility] ORS;  Service: Orthopedics;  Laterality: Left;  . TOTAL KNEE ARTHROPLASTY Left 06/13/2017   Procedure: TOTAL KNEE ARTHROPLASTY;  Surgeon: Thornton Park, MD;  Location: [facility] ORS;  Service: Orthopedics;  Laterality: Left;  . TUBAL LIGATION      There were no vitals filed for this visit.  Subjective Assessment - 09/01/17 1613    Subjective  Patient reports continued increased pain along her left knee joint from the long drive the other day. However, patient reports her left knee pain has decreased since previous visit and that she has been performing HEP. Patient reports pain with home exercise single-leg RDL.  Patient reports taking pain medicine before appointment, decreasing her pain from 6/10 to 4/10.     Pertinent History  PMH: diverticulitis    Limitations  Standing    How long can you stand comfortably?  20min    Diagnostic tests  X-Ray    Patient Stated Goals  Return to dialy activities; dance     Currently in Pain?  Yes    Pain Score  4     Pain Location  Knee    Pain Orientation  Left    Pain Descriptors / Indicators  Aching    Pain Type  Surgical pain    Pain Onset  More than a month ago    Pain Frequency  Constant        TREATMENT  Manual therapy: (all performed with patient in sitting)  -Soft tissue mobilization of the left quadriceps and hamstrings -- 15 min for decreasing pain and improving knee flexion and extension AROM -Soft tissue mobilization of the left tibialis anterior and deep flexors of foot --10 min to decrease muscular spasm and improve ankle AROM  -Tibia on femur inferior joint distraction  -- 5x30 to decrease pain and improve knee flexion and extension AROM -Tibia on femur A-P = grade IV 5x30 sec to decrease pain and improve knee flexion  Therapeutic Exercise:  -Isometrics for ankle inversion and eversion with patient in sitting -- 5 sec holds x10 B to decrease pain and  improve AROM -Isometric for dorsiflexion with patient in sitting --- 5 sec hold x10 B -Manually resisted knee flexion and extension with patient in sitting -- 2x10 -Leg press - 1x8 #55, 1x20 #35 -TRX - 1x17 to increase LE endurance and strength bilaterally and improve left knee extension. -Nustep set to level 3 - 6 min 30 sec to improve left knee ROM, circulation to joint and decrease pain   Patient demonstrates increased fatigue at end of session.        PT Education - 09/01/17 1622    Education provided  Yes    Education Details  Form/technique with exercises: manually resisted knee flexion and extension    Person(s) Educated  Patient    Methods  Explanation;Tactile  cues;Demonstration;Verbal cues    Comprehension  Verbalized understanding;Returned demonstration          PT Long Term Goals - 07/31/17 1456      PT LONG TERM GOAL #1   Title  Patient will be independent with HEP focused on improving limitations to return to prior level of function.     Baseline  Dependent with form/technique    Time  6    Period  Weeks    Status  On-going      PT LONG TERM GOAL #2   Title  Patient will improve 26mWT to >1.0 m/s without use of AD to demonstrate significant improvement in function    Baseline  .3 m/s with FWW; .71 without AD     Time  6    Period  Weeks    Status  On-going      PT LONG TERM GOAL #3   Title  Pt will demonstrate knee AROM of extension: 0 and flexion >120deg to demonstrate greater ability to walk without increase in pain    Baseline  extension 10 (from neutral); flexion: 80deg; 07/31/17: 6 from neutral, 105 flexion     Time  6    Period  Weeks    Status  On-going      PT LONG TERM GOAL #4   Title  Patient will be able to stand 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  Patient demonstrates improvement in pain and knee flexion, ankle dorsiflexion and inversion secondary to STM and isometric holds performed with the patient in sitting. Patient demonstrates decreased strength with therapeutic exercises: bilateral leg press and TRX squats due to increased pain of left LE and knee pain consistent with spasm along the deep toe extensors and toe flexors along the proximal insertion of those muscles.  Patient will continue to benefit from skilled therapy to  reduce pain and restore strength of left LE.    Rehab Potential  Good    Clinical Impairments Affecting Rehab Potential  (+) highly motivated, family support (-) Previous knee pain    PT Frequency  2x / week    PT Duration  6 weeks    PT Treatment/Interventions  Dry needling;Taping;Passive range of  motion;Manual techniques;Neuromuscular re-education;Gait training;Stair training;Therapeutic activities;Therapeutic exercise;Balance training;Patient/family education;Moist Heat;Ultrasound;Electrical Stimulation;Cryotherapy;Aquatic Therapy;Iontophoresis 4mg /ml Dexamethasone    PT Next Visit Plan  Progress strengthening and AROM    PT Home Exercise Plan  See education section    Consulted and Agree with Plan of Care  Patient       Patient will benefit from skilled therapeutic intervention in order to improve the following deficits and impairments:  Abnormal gait, Increased fascial restricitons, Pain, Increased muscle spasms, Decreased coordination, Decreased endurance, Decreased range of motion, Decreased strength, Decreased balance, Decreased mobility  Visit Diagnosis: Stiffness of left knee, not elsewhere classified  Left knee pain, unspecified chronicity  Other lack of coordination     Problem List Patient Active Problem List   Diagnosis Date Noted  . S/P total knee arthroplasty, left 06/13/2017  . Bruising 03/25/2017  . Pre-op evaluation 10/25/2016  . Bilateral knee pain 11/01/2015  . Thrombocytopenia ([place]) 11/01/2015  . AP (abdominal pain)   . Diverticulitis large intestine 10/01/2015    Ricard Dillon, SPT 09/01/2017, 5:01 PM  [place] [facility] [address], [place], [address] Phone: [phone]   Fax:  [phone]  Name: Brittley Regner MRN: [id number] Date of Birth: Mar 19, 1956

## 2017-09-04 ENCOUNTER — Ambulatory Visit: Payer: 59

## 2017-09-04 DIAGNOSIS — M25662 Stiffness of left knee, not elsewhere classified: Secondary | ICD-10-CM | POA: Diagnosis not present

## 2017-09-04 DIAGNOSIS — R278 Other lack of coordination: Secondary | ICD-10-CM

## 2017-09-04 DIAGNOSIS — M25562 Pain in left knee: Secondary | ICD-10-CM

## 2017-09-04 NOTE — Therapy (Signed)
[place] [facility] [address], [place], [address] Phone: [phone]   Fax:  [phone]  Physical Therapy Treatment  Patient Details  Name: Rebecca Watkins MRN: [id number] Date of Birth: 1956/07/26 Referring Provider: Thornton Park   Encounter Date: 09/04/2017  PT End of Session - 09/04/17 1521    Visit Number  21    Number of Visits  21    Date for PT Re-Evaluation  09/11/17    PT Start Time  2831    PT Stop Time  1515    PT Time Calculation (min)  40 min    Activity Tolerance  Patient tolerated treatment well    Behavior During Therapy  [facility] for tasks assessed/performed       Past Medical History:  Diagnosis Date  . Arthritis    Osteoarthritis  . Breast discharge 04/2017   when expressed  . Diverticulitis 2017  . PONV (postoperative nausea and vomiting)   . Temporary low platelet count (HCC)    H/O    Past Surgical History:  Procedure Laterality Date  . COLONOSCOPY WITH PROPOFOL N/A 12/15/2015   Procedure: COLONOSCOPY WITH PROPOFOL;  Surgeon: Manya Silvas, MD;  Location: [facility] ENDOSCOPY;  Service: Endoscopy;  Laterality: N/A;  . KNEE ARTHROSCOPY WITH MEDIAL MENISECTOMY Left 10/31/2016   Procedure: KNEE ARTHROSCOPY WITH MEDIAL MENISECTOMY;  Surgeon: Thornton Park, MD;  Location: [facility] ORS;  Service: Orthopedics;  Laterality: Left;  . TOTAL KNEE ARTHROPLASTY Left 06/13/2017   Procedure: TOTAL KNEE ARTHROPLASTY;  Surgeon: Thornton Park, MD;  Location: [facility] ORS;  Service: Orthopedics;  Laterality: Left;  . TUBAL LIGATION      There were no vitals filed for this visit.  Subjective Assessment - 09/04/17 1518    Subjective  Patient demonstrates increased pain along her knee. Patient states her pain is around the same since the previous visit. Patient reports she has not performed her single leg DL exercise; but has performed the other exercises. Patient reports she worried about her knee pain and  may call the doctor about the pain.     Pertinent History  PMH: diverticulitis    Limitations  Standing    How long can you stand comfortably?  18min    Diagnostic tests  X-Ray    Patient Stated Goals  Return to dialy activities; dance     Currently in Pain?  Yes    Pain Score  4     Pain Location  Knee    Pain Orientation  Left    Pain Descriptors / Indicators  Aching    Pain Type  Surgical pain    Pain Onset  More than a month ago    Pain Frequency  Constant       TREATMENT  Therapeutic Exercise: Standing squats with UE support - x 20  Single leg stance for balance on airex pad - 2 x 1.81min B B heel raises in standing with UE support - x 20  Knee flexion ext with ball and 4# ankle weight around nakle  Leg press - for gastrocnemius and quadriceps strength x25 35# Step ups at 6" step - x 20 with UE support   Dry Needling: (26min) (2) .25 x10mm needles placed along the tibialis anterior on the L LE to help decrease increased pain and spasms along the specified musculature. (1) .4 x 188mm needle placed along the deep flexors of the foot (flexor digitorum & hallucis longus) to decrease increased pain and  spasms. Patient educated on benefits and risks of therapy. Patient verbally agrees to therapy.    Patient demonstrates increased fatigue at end of session.     PT Education - 09/04/17 1520    Education provided  Yes    Education Details  Continue HEP not performing exercises into a painful AROM    Person(s) Educated  Patient    Methods  Explanation;Demonstration    Comprehension  Verbalized understanding;Returned demonstration          PT Long Term Goals - 07/31/17 1456      PT LONG TERM GOAL #1   Title  Patient will be independent with HEP focused on improving limitations to return to prior level of function.     Baseline  Dependent with form/technique    Time  6    Period  Weeks    Status  On-going      PT LONG TERM GOAL #2   Title  Patient will improve 86mWT  to >1.0 m/s without use of AD to demonstrate significant improvement in function    Baseline  .3 m/s with FWW; .71 without AD     Time  6    Period  Weeks    Status  On-going      PT LONG TERM GOAL #3   Title  Pt will demonstrate knee AROM of extension: 0 and flexion >120deg to demonstrate greater ability to walk without increase in pain    Baseline  extension 10 (from neutral); flexion: 80deg; 07/31/17: 6 from neutral, 105 flexion     Time  6    Period  Weeks    Status  On-going      PT LONG TERM GOAL #4   Title  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 Patient reports increased pain over these areas. Patient demosntrates less pain with palpation over the affected areas after dry needling indivcating decreased muscular spasms. Continued to focus on performing exercises to promote extension. Patient will benefit from further skilled therapy to return to prior level of function.     Rehab Potential  Good    Clinical Impairments Affecting Rehab Potential  (+) highly motivated, family support (-) Previous knee pain    PT Frequency  2x / week    PT Duration  6 weeks    PT Treatment/Interventions  Dry needling;Taping;Passive range of motion;Manual techniques;Neuromuscular re-education;Gait training;Stair training;Therapeutic activities;Therapeutic exercise;Balance training;Patient/family education;Moist Heat;Ultrasound;Electrical Stimulation;Cryotherapy;Aquatic Therapy;Iontophoresis 4mg /ml Dexamethasone    PT Next Visit Plan  Progress strengthening and AROM    PT Home Exercise Plan  See education  section    Consulted and Agree with Plan of Care  Patient       Patient will benefit from skilled therapeutic intervention in order to improve the following deficits and impairments:  Abnormal gait, Increased fascial restricitons, Pain, Increased muscle spasms, Decreased coordination, Decreased endurance, Decreased range of motion, Decreased strength, Decreased balance, Decreased mobility  Visit  Diagnosis: Left knee pain, unspecified chronicity  Stiffness of left knee, not elsewhere classified  Other lack of coordination     Problem List Patient Active Problem List   Diagnosis Date Noted  . S/P total knee arthroplasty, left 06/13/2017  . Bruising 03/25/2017  . Pre-op evaluation 10/25/2016  . Bilateral knee pain 11/01/2015  . Thrombocytopenia ([place]) 11/01/2015  . AP (abdominal pain)   . Diverticulitis large intestine 10/01/2015    Blythe Stanford, PT DPT 09/04/2017, 3:25 PM  [place] [facility] [address] [place], [address] Phone: [phone]   Fax:  [phone]  Name: Journii Nierman MRN: [id number] Date of Birth: Oct 22, 1955

## 2017-09-08 ENCOUNTER — Ambulatory Visit: Payer: 59

## 2017-09-08 DIAGNOSIS — M25662 Stiffness of left knee, not elsewhere classified: Secondary | ICD-10-CM

## 2017-09-08 DIAGNOSIS — R278 Other lack of coordination: Secondary | ICD-10-CM | POA: Diagnosis not present

## 2017-09-08 DIAGNOSIS — M25562 Pain in left knee: Secondary | ICD-10-CM | POA: Diagnosis not present

## 2017-09-08 NOTE — Therapy (Signed)
[place] [facility] [address] [place], [address] Phone: [phone]   Fax:  [phone]  Physical Therapy Treatment  Patient Details  Name: Rebecca Watkins MRN: [id number] Date of Birth: 1955-10-25 Referring Provider: Thornton Park   Encounter Date: 09/08/2017  PT End of Session - 09/08/17 1532    Visit Number  22    Number of Visits  22    Date for PT Re-Evaluation  09/11/17    PT Start Time  1435    PT Stop Time  1515    PT Time Calculation (min)  40 min    Activity Tolerance  Patient tolerated treatment well    Behavior During Therapy  [facility] for tasks assessed/performed       Past Medical History:  Diagnosis Date  . Arthritis    Osteoarthritis  . Breast discharge 04/2017   when expressed  . Diverticulitis 2017  . PONV (postoperative nausea and vomiting)   . Temporary low platelet count (HCC)    H/O    Past Surgical History:  Procedure Laterality Date  . COLONOSCOPY WITH PROPOFOL N/A 12/15/2015   Procedure: COLONOSCOPY WITH PROPOFOL;  Surgeon: Manya Silvas, MD;  Location: [facility] ENDOSCOPY;  Service: Endoscopy;  Laterality: N/A;  . KNEE ARTHROSCOPY WITH MEDIAL MENISECTOMY Left 10/31/2016   Procedure: KNEE ARTHROSCOPY WITH MEDIAL MENISECTOMY;  Surgeon: Thornton Park, MD;  Location: [facility] ORS;  Service: Orthopedics;  Laterality: Left;  . TOTAL KNEE ARTHROPLASTY Left 06/13/2017   Procedure: TOTAL KNEE ARTHROPLASTY;  Surgeon: Thornton Park, MD;  Location: [facility] ORS;  Service: Orthopedics;  Laterality: Left;  . TUBAL LIGATION      There were no vitals filed for this visit.  Subjective Assessment - 09/08/17 1515    Subjective  Patient states the pain in her knee continues to be painful. Patient reports her pain unrelenting and would like to try electrical stimulationto help decrease her pain.     Pertinent History  PMH: diverticulitis    Limitations  Standing    How long can you stand  comfortably?  87min    Diagnostic tests  X-Ray    Patient Stated Goals  Return to dialy activities; dance     Currently in Pain?  Yes    Pain Score  4     Pain Location  Knee    Pain Orientation  Left    Pain Descriptors / Indicators  Aching    Pain Onset  More than a month ago    Pain Frequency  Constant        TREATMENT   Manual therapy:  Assistive knee flexion/ext AROM in supine to decrease pain and spasms in supine; patient performs hamstring isometrics and quadriceps isometrics in standing.  STM performed to patients quadriceps and hamstrings to help decrease pain and spasms with use of superficial techniques with patient in supine  Mobilizations in supine A-P, P-A grade III-IV to improve mobilization and AROM of the affected knee with the patient positioned in supine; 3 x 30sec  Modalities: High Volt: (4) large electrodes placed along the affected knee to improve pain and spasms and allow for further performance of AROM exercises with patient positioned in supine. Moist heat pack placed along the affected LE to improve pain. Both modalities performed for 34min. Skin checked with no adverse signs noted.   Patient demonstrates increased fatigue at end of session.     PT Education - 09/08/17 1531    Education provided  Yes    Education Details  HEP: quad sets in standing    Person(s) Educated  Patient    Methods  Demonstration;Explanation    Comprehension  Verbalized understanding;Returned demonstration          PT Long Term Goals - 07/31/17 1456      PT LONG TERM GOAL #1   Title  Patient will be independent with HEP focused on improving limitations to return to prior level of function.     Baseline  Dependent with form/technique    Time  6    Period  Weeks    Status  On-going      PT LONG TERM GOAL #2   Title  Patient will improve 57mWT to >1.0 m/s without use of AD to demonstrate significant improvement in function    Baseline  .3 m/s with FWW; .71 without  AD     Time  6    Period  Weeks    Status  On-going      PT LONG TERM GOAL #3   Title  Pt will demonstrate knee AROM of extension: 0 and flexion >120deg to demonstrate greater ability to walk without increase in pain    Baseline  extension 10 (from neutral); flexion: 80deg; 07/31/17: 6 from neutral, 105 flexion     Time  6    Period  Weeks    Status  On-going      PT LONG TERM GOAL #4   Title  Patient will be able to stand for over 2 hours without AD 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 [place] and patient's husband both give verbal confirmation to education. Patient reports decreased pain after performing modalities treatment indicating decreased spasms. Patient will benefit from the performance of modalities treeatment at the beginning of therapy to decrease pain and allow for performance of therapy. Patient is resistant to performing full AROM which is probably from pain avoidance. Patient will benefit from further skilled therapy to return to prior level of function.     Rehab Potential  Good    Clinical Impairments Affecting Rehab Potential  (+) highly motivated, family support (-) Previous knee pain    PT Frequency  2x / week    PT Duration  6 weeks    PT Treatment/Interventions  Dry needling;Taping;Passive range of motion;Manual techniques;Neuromuscular re-education;Gait training;Stair training;Therapeutic activities;Therapeutic exercise;Balance training;Patient/family education;Moist Heat;Ultrasound;Electrical Stimulation;Cryotherapy;Aquatic Therapy;Iontophoresis 4mg /ml Dexamethasone    PT Next Visit Plan  Progress  strengthening and AROM    PT Home Exercise Plan  See education section    Consulted and Agree with Plan of Care  Patient       Patient will benefit from skilled therapeutic intervention in order to improve the following deficits and impairments:  Abnormal gait, Increased fascial restricitons, Pain, Increased muscle spasms, Decreased coordination, Decreased endurance, Decreased range of motion, Decreased strength, Decreased balance, Decreased mobility  Visit Diagnosis: Left knee pain, unspecified chronicity  Stiffness of left knee, not elsewhere classified  Other lack of coordination  Problem List Patient Active Problem List   Diagnosis Date Noted  . S/P total knee arthroplasty, left 06/13/2017  . Bruising 03/25/2017  . Pre-op evaluation 10/25/2016  . Bilateral knee pain 11/01/2015  . Thrombocytopenia ([place]) 11/01/2015  . AP (abdominal pain)   . Diverticulitis large intestine 10/01/2015    Blythe Stanford, PT DPT 09/08/2017, 3:59 PM  [place] [facility] [address], [place], [address] Phone: [phone]   Fax:  [phone]  Name: Rebecca Watkins MRN: [id number] Date of Birth: Mar 09, 1956

## 2017-09-11 ENCOUNTER — Ambulatory Visit: Payer: 59

## 2017-09-11 DIAGNOSIS — R278 Other lack of coordination: Secondary | ICD-10-CM | POA: Diagnosis not present

## 2017-09-11 DIAGNOSIS — M25562 Pain in left knee: Secondary | ICD-10-CM | POA: Diagnosis not present

## 2017-09-11 DIAGNOSIS — M25662 Stiffness of left knee, not elsewhere classified: Secondary | ICD-10-CM

## 2017-09-11 NOTE — Therapy (Signed)
[place] [facility] [address] [place], [address] Phone: [phone]   Fax:  [phone]  Physical Therapy Treatment  Patient Details  Name: Rebecca Watkins MRN: [id number] Date of Birth: 1956/05/07 Referring Provider: Thornton Park   Encounter Date: 09/11/2017  PT End of Session - 09/11/17 1454    Visit Number  23    Number of Visits  23    Date for PT Re-Evaluation  09/11/17    PT Start Time  1660    PT Stop Time  1515    PT Time Calculation (min)  43 min    Activity Tolerance  Patient tolerated treatment well    Behavior During Therapy  [facility] for tasks assessed/performed       Past Medical History:  Diagnosis Date  . Arthritis    Osteoarthritis  . Breast discharge 04/2017   when expressed  . Diverticulitis 2017  . PONV (postoperative nausea and vomiting)   . Temporary low platelet count (HCC)    H/O    Past Surgical History:  Procedure Laterality Date  . COLONOSCOPY WITH PROPOFOL N/A 12/15/2015   Procedure: COLONOSCOPY WITH PROPOFOL;  Surgeon: Manya Silvas, MD;  Location: [facility] ENDOSCOPY;  Service: Endoscopy;  Laterality: N/A;  . KNEE ARTHROSCOPY WITH MEDIAL MENISECTOMY Left 10/31/2016   Procedure: KNEE ARTHROSCOPY WITH MEDIAL MENISECTOMY;  Surgeon: Thornton Park, MD;  Location: [facility] ORS;  Service: Orthopedics;  Laterality: Left;  . TOTAL KNEE ARTHROPLASTY Left 06/13/2017   Procedure: TOTAL KNEE ARTHROPLASTY;  Surgeon: Thornton Park, MD;  Location: [facility] ORS;  Service: Orthopedics;  Laterality: Left;  . TUBAL LIGATION      There were no vitals filed for this visit.  Subjective Assessment - 09/11/17 1443    Subjective  Patient states the pain in the knee was increased the night of previous session but states the next day the pain had subsided.     Pertinent History  PMH: diverticulitis    Limitations  Standing    How long can you stand comfortably?  47min    Diagnostic tests  X-Ray    Patient Stated Goals  Return to dialy activities; dance     Currently in Pain?  Yes    Pain Score  4     Pain Location  Knee    Pain Orientation  Left    Pain Descriptors / Indicators  Aching    Pain Type  Surgical pain    Pain Onset  More than a month ago    Pain Frequency  Constant       TREATMENT  Therapeutic Exercise: TKE with red and yellow band - 2 x 20  SLR in long sitting - 5 x 6 reps Monster walks with YTB around knees - 65ft x 10   Manual therapy:  STM performed to patients quadriceps and hamstrings to help decrease pain and spasms with use of superficial techniques with patient in supine   Mobilizations in supine A-P, P-A grade III-IV to improve mobilization and AROM of the affected knee with the patient positioned in supine; 2 x 30sec   Modalities: High Volt: (4) large electrodes placed along the affected knee to improve pain and spasms and allow for further performance of AROM exercises with patient positioned in supine. Moist heat pack placed along the affected LE to improve pain. Both modalities performed for 34min. Skin checked with no adverse signs noted.     Patient demonstrates increased fatigue at end of  session.  PT Education - 09/11/17 1453    Education provided  Yes    Education Details  form/technique with exercise    Person(s) Educated  Patient    Methods  Explanation;Demonstration    Comprehension  Verbalized understanding;Returned demonstration          PT Long Term Goals - 07/31/17 1456      PT LONG TERM GOAL #1   Title  Patient will be independent with HEP focused on improving limitations to return to prior level of function.     Baseline  Dependent with form/technique    Time  6    Period  Weeks    Status  On-going      PT LONG TERM GOAL #2   Title  Patient will improve 18mWT to >1.0 m/s without use of AD to demonstrate significant improvement in function    Baseline  .3 m/s with FWW; .71 without AD     Time  6    Period  Weeks     Status  On-going      PT LONG TERM GOAL #3   Title  Pt will demonstrate knee AROM of extension: 0 and flexion >120deg to demonstrate greater ability to walk without increase in pain    Baseline  extension 10 (from neutral); flexion: 80deg; 07/31/17: 6 from neutral, 105 flexion     Time  6    Period  Weeks    Status  On-going      PT LONG TERM GOAL #4   Title  Patient will be able to stand 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 Patient demonstrates decreased muscular activation of the quadriceps and demonstrates extensor lag after 7 reps of SLR indicating poor strength and motor control. Continued to focus on improving quadriceps and hip strength with exercise. Patient deomsntrates AROM for extension of 5 deg and 110 of knee flexion. Patient will benefit from further skilled therapy to return to prior level of function.     Rehab Potential  Good    Clinical Impairments Affecting Rehab Potential  (+) highly motivated, family support (-) Previous knee pain    PT Frequency  2x / week    PT Duration  6 weeks    PT Treatment/Interventions  Dry needling;Taping;Passive range of motion;Manual techniques;Neuromuscular re-education;Gait training;Stair training;Therapeutic activities;Therapeutic exercise;Balance training;Patient/family education;Moist Heat;Ultrasound;Electrical Stimulation;Cryotherapy;Aquatic Therapy;Iontophoresis 4mg /ml Dexamethasone    PT Next Visit Plan  Progress strengthening and AROM    PT Home Exercise Plan  See education section    Consulted and Agree with Plan of Care  Patient       Patient will benefit from skilled therapeutic  intervention in order to improve the following deficits and impairments:  Abnormal gait, Increased fascial restricitons, Pain, Increased muscle spasms, Decreased coordination, Decreased endurance, Decreased range of motion, Decreased strength, Decreased balance, Decreased mobility  Visit Diagnosis: Left knee pain, unspecified chronicity  Stiffness of left knee, not elsewhere classified  Other lack of coordination     Problem List Patient Active  Problem List   Diagnosis Date Noted  . S/P total knee arthroplasty, left 06/13/2017  . Bruising 03/25/2017  . Pre-op evaluation 10/25/2016  . Bilateral knee pain 11/01/2015  . Thrombocytopenia ([place]) 11/01/2015  . AP (abdominal pain)   . Diverticulitis large intestine 10/01/2015    Blythe Stanford, PT DPT 09/11/2017, 3:45 PM  [place] [facility] [address], [place], [address] Phone: [phone]   Fax:  [phone]  Name: Johannah Rozas MRN: [id number] Date of Birth: [birth year]

## 2017-09-15 ENCOUNTER — Ambulatory Visit: Payer: 59

## 2017-09-15 DIAGNOSIS — M25662 Stiffness of left knee, not elsewhere classified: Secondary | ICD-10-CM

## 2017-09-15 DIAGNOSIS — R278 Other lack of coordination: Secondary | ICD-10-CM | POA: Diagnosis not present

## 2017-09-15 DIAGNOSIS — M25562 Pain in left knee: Secondary | ICD-10-CM | POA: Diagnosis not present

## 2017-09-15 NOTE — Therapy (Signed)
[place] [facility] [address] [place], [address] Phone: [phone]   Fax:  [phone]  Physical Therapy Treatment  Patient Details  Name: Rebecca Watkins MRN: [id number] Date of Birth: 10-06-1955 Referring Provider: Thornton Park   Encounter Date: 09/15/2017  PT End of Session - 09/15/17 1529    Visit Number  24    Number of Visits  32    Date for PT Re-Evaluation  10/13/17    PT Start Time  1440    PT Stop Time  1528    PT Time Calculation (min)  48 min    Activity Tolerance  Patient tolerated treatment well    Behavior During Therapy  [facility] for tasks assessed/performed       Past Medical History:  Diagnosis Date  . Arthritis    Osteoarthritis  . Breast discharge 04/2017   when expressed  . Diverticulitis 2017  . PONV (postoperative nausea and vomiting)   . Temporary low platelet count (HCC)    H/O    Past Surgical History:  Procedure Laterality Date  . COLONOSCOPY WITH PROPOFOL N/A 12/15/2015   Procedure: COLONOSCOPY WITH PROPOFOL;  Surgeon: Manya Silvas, MD;  Location: [facility] ENDOSCOPY;  Service: Endoscopy;  Laterality: N/A;  . KNEE ARTHROSCOPY WITH MEDIAL MENISECTOMY Left 10/31/2016   Procedure: KNEE ARTHROSCOPY WITH MEDIAL MENISECTOMY;  Surgeon: Thornton Park, MD;  Location: [facility] ORS;  Service: Orthopedics;  Laterality: Left;  . TOTAL KNEE ARTHROPLASTY Left 06/13/2017   Procedure: TOTAL KNEE ARTHROPLASTY;  Surgeon: Thornton Park, MD;  Location: [facility] ORS;  Service: Orthopedics;  Laterality: Left;  . TUBAL LIGATION      There were no vitals filed for this visit.  Subjective Assessment - 09/15/17 1458    Subjective  Patient states her knee is more stiff than it is pain. Patient states she was only able to stand for ~64min wihtout increase in pain after helping husband in the garage. Patient states she wants her leg to not hurt as much as it has been.     Pertinent History  PMH:  diverticulitis    Limitations  Standing    How long can you stand comfortably?  52min    Diagnostic tests  X-Ray    Patient Stated Goals  Return to dialy activities; dance     Currently in Pain?  Yes    Pain Score  5     Pain Location  Knee    Pain Orientation  Left    Pain Descriptors / Indicators  Aching    Pain Type  Surgical pain    Pain Onset  More than a month ago    Pain Frequency  Constant       TREATMENT: Therapeutic Exercise: Standing TRX squats -- x 20 without UE support Bilateral leg press at OMEGA -- x20 25#; x20 45# Standing hip abduction at hip machine -- x 25 25# Single leg stance -- x 60sec; single leg stance with rotations -- x 60sec SLR in long sitting -- x 10   Manual Therapy: All performed in long sitting Knee mobilizations: P-A grade IV 3 x 30 sec; prolong holds into knee extension 3 x 30 sec with leg propped onto elevated part of the table. STM performed to quadriceps to decrease increased pain and spasms.   Modalities: With patient in long sitting and two large pads places over the quadriceps musculature. [place] stim4 sec on/ 12 sec off with 2 sec ramp and an  intensity of 34 mA for 23min with patient actively contracting during electrode activation  Patient demonstrates increased fatigue at end of the session    PT Education - 09/15/17 1528    Education provided  Yes    Education Details  form/technique with exercise    Person(s) Educated  Patient    Methods  Explanation;Demonstration    Comprehension  Verbalized understanding;Returned demonstration          PT Long Term Goals - 09/15/17 1600      PT LONG TERM GOAL #1   Title  Patient will be independent with HEP focused on improving limitations to return to prior level of function.     Baseline  Dependent with form/technique    Time  6    Period  Weeks    Status  On-going      PT LONG TERM GOAL #2   Title  Patient will improve 49mWT to >1.0 m/s without use of AD to demonstrate  significant improvement in function    Baseline  .3 m/s with FWW; .71 without AD; 09/15/2017: 1.53m/s    Time  6    Period  Weeks    Status  Achieved      PT LONG TERM GOAL #3   Title  Pt will demonstrate knee AROM of extension: 0 and flexion >120deg to demonstrate greater ability to walk without increase in pain    Baseline  extension 10 (from neutral); flexion: 80deg; 07/31/17: 6 from neutral, 105 flexion; 09/15/2017: 5 form neutral, 110 flexion    Time  6    Period  Weeks    Status  On-going      PT LONG TERM GOAL #4   Title  Patient 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 Patient continues to have significant pain with standing and . Since patient demonstrated increased extensor lag with SLR, performed russian stiumulation to the quadriceps on the L LE to improve muscular activation. Patient demonstrates less extensor lag after performing modalities russian stim indicating improvement in quadriceps recruitment and muscular coordination. Focused on performing strenthening exercises for the patient's quadriceps and hips to allow for improvement with standing for long periods of time. Patient will benefit from further skilled therapy to return to prior level of function.     Rehab Potential  Good    Clinical Impairments Affecting Rehab Potential  (+) highly motivated, family support (-) Previous knee pain    PT Frequency  2x / week    PT Duration  6 weeks    PT Treatment/Interventions  Dry needling;Taping;Passive range of motion;Manual techniques;Neuromuscular re-education;Gait training;Stair  training;Therapeutic activities;Therapeutic exercise;Balance training;Patient/family education;Moist Heat;Ultrasound;Electrical Stimulation;Cryotherapy;Aquatic Therapy;Iontophoresis 4mg /ml Dexamethasone    PT Next Visit Plan  Progress strengthening and AROM    PT Home Exercise Plan  See education section    Consulted and Agree with Plan of Care  Patient       Patient will benefit from skilled therapeutic intervention in order to  improve the following deficits and impairments:  Abnormal gait, Increased fascial restricitons, Pain, Increased muscle spasms, Decreased coordination, Decreased endurance, Decreased range of motion, Decreased strength, Decreased balance, Decreased mobility  Visit Diagnosis: Left knee pain, unspecified chronicity  Stiffness of left knee, not elsewhere classified  Other lack of coordination     Problem List Patient Active Problem List   Diagnosis Date Noted  . S/P total knee arthroplasty, left 06/13/2017  . Bruising 03/25/2017  . Pre-op evaluation 10/25/2016  . Bilateral knee pain 11/01/2015  . Thrombocytopenia ([place]) 11/01/2015  . AP (abdominal pain)   . Diverticulitis large intestine 10/01/2015    Blythe Stanford, PT DPT 09/15/2017, 4:29 PM  [place] [facility] [address] [place], [address] Phone: [phone]   Fax:  [phone]  Name: Jackeline Gutknecht MRN: [id number] Date of Birth: 09-09-1955

## 2017-09-17 DIAGNOSIS — Z96652 Presence of left artificial knee joint: Secondary | ICD-10-CM | POA: Diagnosis not present

## 2017-09-18 ENCOUNTER — Ambulatory Visit: Payer: 59

## 2017-10-07 ENCOUNTER — Ambulatory Visit: Payer: 59 | Attending: Orthopedic Surgery

## 2017-10-07 DIAGNOSIS — R278 Other lack of coordination: Secondary | ICD-10-CM | POA: Insufficient documentation

## 2017-10-07 DIAGNOSIS — M25562 Pain in left knee: Secondary | ICD-10-CM | POA: Insufficient documentation

## 2017-10-07 DIAGNOSIS — M25662 Stiffness of left knee, not elsewhere classified: Secondary | ICD-10-CM | POA: Diagnosis not present

## 2017-10-07 NOTE — Therapy (Signed)
[facility] [address] [place], [address] Phone: [phone]   Fax:  [phone]  Physical Therapy Treatment  Patient Details  Name: Rebecca Watkins MRN: [id number] Date of Birth: Apr 07, 1956 Referring Provider: Thornton Park   Encounter Date: 10/07/2017  PT End of Session - 10/07/17 1509    Visit Number  25    Number of Visits  32    Date for PT Re-Evaluation  10/13/17    PT Start Time  3846    PT Stop Time  1450    PT Time Calculation (min)  65 min    Activity Tolerance  Patient tolerated treatment well    Behavior During Therapy  [facility] for tasks assessed/performed       Past Medical History:  Diagnosis Date  . Arthritis    Osteoarthritis  . Breast discharge 04/2017   when expressed  . Diverticulitis 2017  . PONV (postoperative nausea and vomiting)   . Temporary low platelet count (HCC)    H/O    Past Surgical History:  Procedure Laterality Date  . COLONOSCOPY WITH PROPOFOL N/A 12/15/2015   Procedure: COLONOSCOPY WITH PROPOFOL;  Surgeon: Manya Silvas, MD;  Location: [facility] ENDOSCOPY;  Service: Endoscopy;  Laterality: N/A;  . KNEE ARTHROSCOPY WITH MEDIAL MENISECTOMY Left 10/31/2016   Procedure: KNEE ARTHROSCOPY WITH MEDIAL MENISECTOMY;  Surgeon: Thornton Park, MD;  Location: [facility] ORS;  Service: Orthopedics;  Laterality: Left;  . TOTAL KNEE ARTHROPLASTY Left 06/13/2017   Procedure: TOTAL KNEE ARTHROPLASTY;  Surgeon: Thornton Park, MD;  Location: [facility] ORS;  Service: Orthopedics;  Laterality: Left;  . TUBAL LIGATION      There were no vitals filed for this visit.  Subjective Assessment - 10/07/17 1505    Subjective  Patient reports decreased pain with during prednisone use but continues to have pain at rest. Patient states she would like to continue physical therapy.     Pertinent History  PMH: diverticulitis    Limitations  Standing    How long can you stand comfortably?  62min    Diagnostic tests  X-Ray    Patient Stated Goals  Return to dialy activities; dance     Currently in Pain?  No/denies Increased stiffness; denies pain    Pain Onset  More than a month ago         Manual Therapy: STM performed to hamstrings and quadriceps musculature to decrease pain and spasms with patient in sitting throughout session. Performed effleurage to the knee with patient in sitting to improve pain and spasms.  Therapeutic Exercise: Widened tandem weight shifts forward/backward - with L LE behind Amb with dumbbell weight held on the L LE - 6 x 88ft Monster Walks forward with GTB around knees - 2 x 79ft with GTB  Leg Press -- 45# x 20; 35# x20 at [facility]  Seated knee flexion and extension with ball and 5# around ankle - 2 x 20  Knee flexion in sitting isometric pushes - x 20   Modalities: 4 large electrodes placed along the borders of the knee to decrease increased pain and spasms with large moist heat pad placed on top of the L knee. With high volt utilized for 20 min. Patient demonstrates no adverse skin changes during or after session.   Patient demonstrates decreased pain at the end of the session   PT Education - 10/07/17 1509    Education provided  Yes    Education Details  form/technique with  exercise    Person(s) Educated  Patient    Methods  Explanation;Demonstration    Comprehension  Verbalized understanding;Returned demonstration          PT Long Term Goals - 09/15/17 1600      PT LONG TERM GOAL #1   Title  Patient will be independent with HEP focused on improving limitations to return to prior level of function.     Baseline  Dependent with form/technique    Time  6    Period  Weeks    Status  On-going      PT LONG TERM GOAL #2   Title  Patient will improve 29mWT to >1.0 m/s without use of AD to demonstrate significant improvement in function    Baseline  .3 m/s with FWW; .71 without AD; 09/15/2017: 1.65m/s    Time  6    Period  Weeks    Status   Achieved      PT LONG TERM GOAL #3   Title  Pt will demonstrate knee AROM of extension: 0 and flexion >120deg to demonstrate greater ability to walk without increase in pain    Baseline  extension 10 (from neutral); flexion: 80deg; 07/31/17: 6 from neutral, 105 flexion; 09/15/2017: 5 form neutral, 110 flexion    Time  6    Period  Weeks    Status  On-going      PT LONG TERM GOAL #4   Title  Patient will be able to stand for over 2 hours without AD 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 Patient continues to demonstrate decreased full knee extension in long sitting and demonstrates increased knee pain with heel raises and will benefit from further skilled therapy to return to prior level of function.     Rehab Potential  Good    Clinical Impairments Affecting Rehab Potential  (+) highly motivated, family support (-) Previous knee pain    PT Frequency  2x / week    PT Duration  6 weeks    PT Treatment/Interventions  Dry needling;Taping;Passive range of motion;Manual techniques;Neuromuscular re-education;Gait training;Stair training;Therapeutic activities;Therapeutic exercise;Balance training;Patient/family education;Moist Heat;Ultrasound;Electrical Stimulation;Cryotherapy;Aquatic Therapy;Iontophoresis 4mg /ml Dexamethasone    PT Next Visit Plan  Progress strengthening and AROM    PT Home Exercise Plan  See education section    Consulted and Agree with Plan of Care  Patient       Patient will benefit from skilled therapeutic intervention in order to improve the following deficits and impairments:  Abnormal gait,  Increased fascial restricitons, Pain, Increased muscle spasms, Decreased coordination, Decreased endurance, Decreased range of motion, Decreased strength, Decreased balance, Decreased mobility  Visit Diagnosis: Left knee pain, unspecified chronicity  Stiffness of left knee, not elsewhere classified  Other lack of coordination     Problem List Patient Active Problem List   Diagnosis Date Noted  . S/P total knee arthroplasty, left 06/13/2017  . Bruising 03/25/2017  . Pre-op evaluation 10/25/2016  . Bilateral knee pain 11/01/2015  . Thrombocytopenia ([place]) 11/01/2015  .  AP (abdominal pain)   . Diverticulitis large intestine 10/01/2015    Blythe Stanford, PT DPT 10/07/2017, 3:15 PM  [facility] [address] [place], [address] Phone: [phone]   Fax:  [phone]  Name: Deyani Hegarty MRN: [id number] Date of Birth: February 07, 1956

## 2017-10-09 ENCOUNTER — Ambulatory Visit: Payer: 59

## 2017-10-09 DIAGNOSIS — M25562 Pain in left knee: Secondary | ICD-10-CM | POA: Diagnosis not present

## 2017-10-09 DIAGNOSIS — R278 Other lack of coordination: Secondary | ICD-10-CM | POA: Diagnosis not present

## 2017-10-09 DIAGNOSIS — M25662 Stiffness of left knee, not elsewhere classified: Secondary | ICD-10-CM | POA: Diagnosis not present

## 2017-10-09 NOTE — Therapy (Signed)
[facility] [address] [place], [address] Phone: [phone]   Fax:  [phone]  Physical Therapy Treatment  Patient Details  Name: Rebecca Watkins MRN: [id number] Date of Birth: 1956/01/26 Referring Provider: Thornton Park   Encounter Date: 10/09/2017  PT End of Session - 10/09/17 1658    Visit Number  26    Number of Visits  32    Date for PT Re-Evaluation  10/13/17    PT Start Time  4742    PT Stop Time  1615    PT Time Calculation (min)  60 min    Activity Tolerance  Patient tolerated treatment well    Behavior During Therapy  [facility] for tasks assessed/performed       Past Medical History:  Diagnosis Date  . Arthritis    Osteoarthritis  . Breast discharge 04/2017   when expressed  . Diverticulitis 2017  . PONV (postoperative nausea and vomiting)   . Temporary low platelet count (HCC)    H/O    Past Surgical History:  Procedure Laterality Date  . COLONOSCOPY WITH PROPOFOL N/A 12/15/2015   Procedure: COLONOSCOPY WITH PROPOFOL;  Surgeon: Manya Silvas, MD;  Location: [facility] ENDOSCOPY;  Service: Endoscopy;  Laterality: N/A;  . KNEE ARTHROSCOPY WITH MEDIAL MENISECTOMY Left 10/31/2016   Procedure: KNEE ARTHROSCOPY WITH MEDIAL MENISECTOMY;  Surgeon: Thornton Park, MD;  Location: [facility] ORS;  Service: Orthopedics;  Laterality: Left;  . TOTAL KNEE ARTHROPLASTY Left 06/13/2017   Procedure: TOTAL KNEE ARTHROPLASTY;  Surgeon: Thornton Park, MD;  Location: [facility] ORS;  Service: Orthopedics;  Laterality: Left;  . TUBAL LIGATION      There were no vitals filed for this visit.  Subjective Assessment - 10/09/17 1657    Subjective  Patient reports her knee felt fatigued after the past treatment session but did not have increased pain. Patient reports her knee is feeling good today.     Pertinent History  PMH: diverticulitis    Limitations  Standing    How long can you stand comfortably?  64min    Diagnostic tests  X-Ray    Patient Stated Goals  Return to dialy activities; dance     Currently in Pain?  No/denies    Pain Onset  More than a month ago       Manual Therapy: STM performed to hamstrings and quadriceps musculature to decrease pain and spasms with patient in sitting throughout session. Performed effleurage to the knee with patient in sitting to improve pain and spasms.   Therapeutic Exercise: Monster Walks forward around knees - 2 x 33ft with YTB  Leg Press -- 35# 2x20 at [place] knee flexion and extension with ball and 5# around ankle - 2 x 20  Side stepping across with airex beam - x 20 (~73ft each direction)  Knee flexion in sitting isometric pushes - x 20    Modalities: (4) large electrodes placed along the borders of the knee to decrease increased pain and spasms with large moist heat pad placed on top of the L knee. With high volt utilized for 20 min. Patient demonstrates no adverse skin changes during or after session.    Patient demonstrates decreased pain at the end of the session   PT Education - 10/09/17 1658    Education provided  Yes    Education Details  form/technique with exercise    Person(s) Educated  Patient    Methods  Explanation;Demonstration  Comprehension  Verbalized understanding;Returned demonstration          PT Long Term Goals - 09/15/17 1600      PT LONG TERM GOAL #1   Title  Patient will be independent with HEP focused on improving limitations to return to prior level of function.     Baseline  Dependent with form/technique    Time  6    Period  Weeks    Status  On-going      PT LONG TERM GOAL #2   Title  Patient will improve 75mWT to >1.0 m/s without use of AD to demonstrate significant improvement in function    Baseline  .3 m/s with FWW; .71 without AD; 09/15/2017: 1.74m/s    Time  6    Period  Weeks    Status  Achieved      PT LONG TERM GOAL #3   Title  Pt will demonstrate knee AROM of extension: 0 and flexion  >120deg to demonstrate greater ability to walk without increase in pain    Baseline  extension 10 (from neutral); flexion: 80deg; 07/31/17: 6 from neutral, 105 flexion; 09/15/2017: 5 form neutral, 110 flexion    Time  6    Period  Weeks    Status  On-going      PT LONG TERM GOAL #4   Title  Patient will be able to stand for 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 Patient continues to demonstrate decreased strength with performance of leg press performance but does not demosntrate increased pain at the end of the session. Patient will benefit from further skilled therapy focused on improving limitations to return to prior level of function.     Rehab Potential  Good    Clinical Impairments Affecting Rehab Potential  (+) highly motivated, family support (-) Previous knee pain    PT Frequency  2x / week    PT Duration  6 weeks    PT Treatment/Interventions  Dry needling;Taping;Passive range of motion;Manual techniques;Neuromuscular re-education;Gait training;Stair training;Therapeutic activities;Therapeutic exercise;Balance training;Patient/family education;Moist Heat;Ultrasound;Electrical Stimulation;Cryotherapy;Aquatic Therapy;Iontophoresis 4mg /ml Dexamethasone    PT Next Visit Plan  Progress strengthening and AROM    PT Home Exercise Plan  See education section    Consulted and Agree with Plan of Care  Patient       Patient will benefit from skilled therapeutic intervention in order to improve the following deficits and impairments:  Abnormal gait, Increased fascial restricitons, Pain, Increased  muscle spasms, Decreased coordination, Decreased endurance, Decreased range of motion, Decreased strength, Decreased balance, Decreased mobility  Visit Diagnosis: Left knee pain, unspecified chronicity  Stiffness of left knee, not elsewhere classified  Other lack of coordination     Problem List Patient Active Problem List   Diagnosis Date Noted  . S/P total knee arthroplasty, left 06/13/2017  . Bruising 03/25/2017  . Pre-op evaluation 10/25/2016  . Bilateral knee pain 11/01/2015  . Thrombocytopenia ([place]) 11/01/2015  . AP (abdominal pain)   .  Diverticulitis large intestine 10/01/2015    Blythe Stanford, PT DPT 10/09/2017, 5:14 PM  [facility] [address], [place], [address] Phone: [phone]   Fax:  [phone]  Name: Rebecca Watkins MRN: [id number] Date of Birth: 1956-06-24

## 2017-10-14 ENCOUNTER — Ambulatory Visit: Payer: 59

## 2017-10-14 DIAGNOSIS — M25662 Stiffness of left knee, not elsewhere classified: Secondary | ICD-10-CM | POA: Diagnosis not present

## 2017-10-14 DIAGNOSIS — R278 Other lack of coordination: Secondary | ICD-10-CM

## 2017-10-14 DIAGNOSIS — H524 Presbyopia: Secondary | ICD-10-CM | POA: Diagnosis not present

## 2017-10-14 DIAGNOSIS — M25562 Pain in left knee: Secondary | ICD-10-CM | POA: Diagnosis not present

## 2017-10-14 NOTE — Addendum Note (Signed)
Addended by: Blain Pais on: 10/14/2017 05:01 PM   Modules accepted: Orders

## 2017-10-14 NOTE — Therapy (Signed)
[facility] [address] [place], [address] Phone: [phone]   Fax:  [phone]  Physical Therapy Treatment  Patient Details  Name: Rebecca Watkins MRN: [id number] Date of Birth: September 22, 1955 Referring Provider: Thornton Park   Encounter Date: 10/14/2017  PT End of Session - 10/14/17 1543    Visit Number  27    Number of Visits  32    Date for PT Re-Evaluation  10/13/17    PT Start Time  7169    PT Stop Time  1430    PT Time Calculation (min)  45 min    Activity Tolerance  Patient tolerated treatment well    Behavior During Therapy  [facility] for tasks assessed/performed       Past Medical History:  Diagnosis Date  . Arthritis    Osteoarthritis  . Breast discharge 04/2017   when expressed  . Diverticulitis 2017  . PONV (postoperative nausea and vomiting)   . Temporary low platelet count (HCC)    H/O    Past Surgical History:  Procedure Laterality Date  . COLONOSCOPY WITH PROPOFOL N/A 12/15/2015   Procedure: COLONOSCOPY WITH PROPOFOL;  Surgeon: Manya Silvas, MD;  Location: [facility] ENDOSCOPY;  Service: Endoscopy;  Laterality: N/A;  . KNEE ARTHROSCOPY WITH MEDIAL MENISECTOMY Left 10/31/2016   Procedure: KNEE ARTHROSCOPY WITH MEDIAL MENISECTOMY;  Surgeon: Thornton Park, MD;  Location: [facility] ORS;  Service: Orthopedics;  Laterality: Left;  . TOTAL KNEE ARTHROPLASTY Left 06/13/2017   Procedure: TOTAL KNEE ARTHROPLASTY;  Surgeon: Thornton Park, MD;  Location: [facility] ORS;  Service: Orthopedics;  Laterality: Left;  . TUBAL LIGATION      There were no vitals filed for this visit.  Subjective Assessment - 10/14/17 1538    Subjective  Pt states knee feels good today, but that it feels tight.  Pt states that knee pain was 5/10 before she took her pain medicine and currently rates it as 3/10.    Pertinent History  PMH: diverticulitis    Limitations  Standing    How long can you stand comfortably?  42min     Diagnostic tests  X-Ray    Patient Stated Goals  Return to dialy activities; dance     Pain Score  3     Pain Location  Knee    Pain Orientation  Left    Pain Descriptors / Indicators  Aching    Pain Type  Surgical pain    Pain Onset  More than a month ago    Pain Frequency  Constant       Treatment   Manual Therapy: STM performed to hamstrings and quadriceps musculature to decrease pain and spasms with patient in sitting throughout session. Performed effleurage to the knee with patient in sitting to improve pain and spasms.   Therapeutic Exercise: Leg Press at [facility] -- 45# 2x20, 1x30 35# to increase strength and endurance of the LEs  Side stepping on large wobble stone - 1x15 B to increase prioprioception Hip Hikes - 2x20 B to increase endurance of the glute med bilaterally TRX squats- 2x20 B to increase LE strength and endurance   Modalities: unbilled (4) large electrodes placed along the borders of the knee to decrease increased pain and spasms with large moist heat pad placed on top of the L knee. With high volt utilized for 20 min. Patient demonstrates no adverse skin changes during or after session.    Patient demonstrates increased fatigue with exercise at  end of session.     PT Education - 10/14/17 1543    Education provided  Yes    Education Details  form/techniques with exercise    Person(s) Educated  Patient    Methods  Explanation;Demonstration    Comprehension  Verbalized understanding;Returned demonstration          PT Long Term Goals - 10/14/17 1655      PT LONG TERM GOAL #1   Title  Patient will be independent with HEP focused on improving limitations to return to prior level of function.     Baseline  Dependent with form/technique    Time  6    Period  Weeks    Status  On-going      PT LONG TERM GOAL #2   Title  Patient will improve 12mWT to >1.0 m/s without use of AD to demonstrate significant improvement in function    Baseline  .3 m/s with  FWW; .71 without AD; 09/15/2017: 1.48m/s    Time  6    Period  Weeks    Status  Achieved      PT LONG TERM GOAL #3   Title  Pt will demonstrate knee AROM of extension: 0 and flexion >120deg to demonstrate greater ability to walk without increase in pain    Baseline  extension 10 (from neutral); flexion: 80deg; 07/31/17: 6 from neutral, 105 flexion; 09/15/2017: 5 form neutral, 110 flexion; 10/14/2017: 3 from neutral, 115 flexion    Time  6    Period  Weeks    Status  On-going      PT LONG TERM GOAL #4   Title  Patient will be able 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 Limited improvement from the previous reassessment secondary to patient stopping therapy for 2 weeks to be on prednisone. Patient continues to demonstrate increased muscular guarding in pain around the knee. Pt demonstrates improvement with leg press exercise today by performing more repititions at greater resistance, indicating increased LE strength compared to previous session. Although pt demonstrates improvement, pt requires frequent cueing to perform hip hike exercise with proper technique, indicating poor motor control of the glute med bilaterally.  Pt will continue to benefit from further skilled therapy to return to prior level of function.     Rehab Potential  Good    Clinical Impairments Affecting Rehab Potential  (+) highly motivated, family support (-) Previous knee pain    PT Frequency  2x / week    PT Duration  6 weeks    PT Treatment/Interventions  Dry needling;Taping;Passive range of motion;Manual  techniques;Neuromuscular re-education;Gait training;Stair training;Therapeutic activities;Therapeutic exercise;Balance training;Patient/family education;Moist Heat;Ultrasound;Electrical Stimulation;Cryotherapy;Aquatic Therapy;Iontophoresis 4mg /ml Dexamethasone    PT Next Visit Plan  Progress strengthening and AROM    PT Home Exercise Plan  See education section    Consulted and Agree with Plan of Care  Patient       Patient will benefit from skilled therapeutic intervention  in order to improve the following deficits and impairments:  Abnormal gait, Increased fascial restricitons, Pain, Increased muscle spasms, Decreased coordination, Decreased endurance, Decreased range of motion, Decreased strength, Decreased balance, Decreased mobility  Visit Diagnosis: Left knee pain, unspecified chronicity  Stiffness of left knee, not elsewhere classified  Other lack of coordination     Problem List Patient Active Problem List   Diagnosis Date Noted  . S/P total knee arthroplasty, left 06/13/2017  . Bruising 03/25/2017  . Pre-op evaluation 10/25/2016  . Bilateral knee pain 11/01/2015  . Thrombocytopenia ([place]) 11/01/2015  . AP (abdominal pain)   . Diverticulitis large intestine 10/01/2015    Ricard Dillon, SPT 10/14/2017, 4:59 PM  [facility] [address], [place], [address] Phone: [phone]   Fax:  [phone]  Name: Maison Agrusa MRN: [id number] Date of Birth: [birth year]

## 2017-10-16 ENCOUNTER — Ambulatory Visit: Payer: 59

## 2017-10-20 ENCOUNTER — Ambulatory Visit: Payer: 59 | Attending: Orthopedic Surgery

## 2017-10-20 DIAGNOSIS — M25662 Stiffness of left knee, not elsewhere classified: Secondary | ICD-10-CM | POA: Diagnosis not present

## 2017-10-20 DIAGNOSIS — R278 Other lack of coordination: Secondary | ICD-10-CM | POA: Insufficient documentation

## 2017-10-20 DIAGNOSIS — M25562 Pain in left knee: Secondary | ICD-10-CM | POA: Diagnosis not present

## 2017-10-20 NOTE — Therapy (Signed)
[facility] [address], [place], [address] Phone: [phone]   Fax:  [phone]  Physical Therapy Treatment  Patient Details  Name: Rebecca Watkins MRN: [id number] Date of Birth: [birth year] Referring Provider: Thornton Park   Encounter Date: 10/20/2017  PT End of Session - 10/20/17 1526    Visit Number  27    Number of Visits  32    Date for PT Re-Evaluation  11/11/17    PT Start Time  0240    PT Stop Time  1440    PT Time Calculation (min)  55 min    Activity Tolerance  Patient tolerated treatment well    Behavior During Therapy  [facility] for tasks assessed/performed       Past Medical History:  Diagnosis Date  . Arthritis    Osteoarthritis  . Breast discharge 04/2017   when expressed  . Diverticulitis 2017  . PONV (postoperative nausea and vomiting)   . Temporary low platelet count (HCC)    H/O    Past Surgical History:  Procedure Laterality Date  . COLONOSCOPY WITH PROPOFOL N/A 12/15/2015   Procedure: COLONOSCOPY WITH PROPOFOL;  Surgeon: Manya Silvas, MD;  Location: [facility] ENDOSCOPY;  Service: Endoscopy;  Laterality: N/A;  . KNEE ARTHROSCOPY WITH MEDIAL MENISECTOMY Left 10/31/2016   Procedure: KNEE ARTHROSCOPY WITH MEDIAL MENISECTOMY;  Surgeon: Thornton Park, MD;  Location: [facility] ORS;  Service: Orthopedics;  Laterality: Left;  . TOTAL KNEE ARTHROPLASTY Left 06/13/2017   Procedure: TOTAL KNEE ARTHROPLASTY;  Surgeon: Thornton Park, MD;  Location: [facility] ORS;  Service: Orthopedics;  Laterality: Left;  . TUBAL LIGATION      There were no vitals filed for this visit.  Subjective Assessment - 10/20/17 1520    Subjective  Patient reports the knee is feeling stiff today and states the stiffness sensation is a 4/10. Patient reports she has made slight progress with therapy.     Pertinent History  PMH: diverticulitis    Limitations  Standing    How long can you stand comfortably?  63min     Diagnostic tests  X-Ray    Patient Stated Goals  Return to dialy activities; dance     Currently in Pain?  No/denies    Pain Onset  More than a month ago       Manual Therapy: STM performed to hamstrings and quadriceps musculature to decrease pain and spasms with patient in sitting throughout session. Performed effleurage to the knee with patient in sitting to improve pain and spasms.   Therapeutic Exercise: Resisted hamstring curls -- 2 x 10 with 3 sec holds Hamstring curls into YTB -- x 20  SLR in long sitting -- x 20 Quad sets pressing down into the hand of therapist -- x 20 with 3 sec holds Hip abduction in sitting -- x 20 with 3 sec holds Isometric holds with knee bent at maximum amount -- x 20 3 sec holds   Modalities: iontophoresis One large electrode placed over the medial aspect of the knee with 3.5 ml of dexamethasone placed along the long electrode with patient positioned in sitting for 25 min.    Patient demonstrates increased fatigue with exercise at end of session.   PT Education - 10/20/17 1521    Education provided  Yes    Education Details  form/technique with exercise    Person(s) Educated  Patient    Methods  Explanation;Demonstration    Comprehension  Verbalized understanding;Returned demonstration  PT Long Term Goals - 10/14/17 1655      PT LONG TERM GOAL #1   Title  Patient will be independent with HEP focused on improving limitations to return to prior level of function.     Baseline  Dependent with form/technique    Time  6    Period  Weeks    Status  On-going      PT LONG TERM GOAL #2   Title  Patient will improve 71mWT to >1.0 m/s without use of AD to demonstrate significant improvement in function    Baseline  .3 m/s with FWW; .71 without AD; 09/15/2017: 1.43m/s    Time  6    Period  Weeks    Status  Achieved      PT LONG TERM GOAL #3   Title  Pt will demonstrate knee AROM of extension: 0 and flexion >120deg to demonstrate greater  ability to walk without increase in pain    Baseline  extension 10 (from neutral); flexion: 80deg; 07/31/17: 6 from neutral, 105 flexion; 09/15/2017: 5 form neutral, 110 flexion; 10/14/2017: 3 from neutral, 115 flexion    Time  6    Period  Weeks    Status  On-going      PT LONG TERM GOAL #4   Title  Patient will be able to stand for over 2 hours without AD 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 Patient demonstrates improvement with SLR performance and resisted knee flexion/extension isometrics with ability to perform greater amount of exercise without increase in pain. Performed iontophoresis to patients knee to help with greater anti-inflammatory effect. Patient was unable to tolerate increased intensity and only performed for 25min secondary to time constraints. Patient will benefit from further skilled therapy to return to prior level of function.      Rehab Potential  Good    Clinical Impairments Affecting Rehab Potential  (+) highly motivated, family support (-) Previous knee pain    PT Frequency  2x / week    PT Duration  6 weeks    PT Treatment/Interventions  Dry needling;Taping;Passive range of motion;Manual techniques;Neuromuscular re-education;Gait training;Stair training;Therapeutic activities;Therapeutic exercise;Balance training;Patient/family education;Moist Heat;Ultrasound;Electrical Stimulation;Cryotherapy;Aquatic Therapy;Iontophoresis 4mg /ml Dexamethasone    PT Next Visit Plan  Progress strengthening and AROM    PT Home Exercise Plan  See education section    Consulted and Agree with Plan of Care  Patient        Patient will benefit from skilled therapeutic intervention in order to improve the following deficits and impairments:  Abnormal gait, Increased fascial restricitons, Pain, Increased muscle spasms, Decreased coordination, Decreased endurance, Decreased range of motion, Decreased strength, Decreased balance, Decreased mobility  Visit Diagnosis: Left knee pain, unspecified chronicity  Stiffness of left knee, not elsewhere classified  Other lack of coordination     Problem List Patient Active Problem List   Diagnosis Date Noted  . S/P total knee arthroplasty, left 06/13/2017  . Bruising 03/25/2017  . Pre-op evaluation 10/25/2016  . Bilateral  knee pain 11/01/2015  . Thrombocytopenia ([place]) 11/01/2015  . AP (abdominal pain)   . Diverticulitis large intestine 10/01/2015    Blythe Stanford, PT DPT 10/20/2017, 3:39 PM  [facility] [address], [place], [address] Phone: [phone]   Fax:  [phone]  Name: Rebecca Watkins MRN: [id number] Date of Birth: Feb 08, 1956

## 2017-10-22 ENCOUNTER — Ambulatory Visit: Payer: 59

## 2017-10-22 DIAGNOSIS — M25562 Pain in left knee: Secondary | ICD-10-CM

## 2017-10-22 DIAGNOSIS — R278 Other lack of coordination: Secondary | ICD-10-CM

## 2017-10-22 DIAGNOSIS — M25662 Stiffness of left knee, not elsewhere classified: Secondary | ICD-10-CM | POA: Diagnosis not present

## 2017-10-22 NOTE — Therapy (Signed)
[facility] [address] [place], [address] Phone: [phone]   Fax:  [phone]  Physical Therapy Treatment  Patient Details  Name: Rebecca Watkins MRN: [id number] Date of Birth: 10-06-1955 Referring Provider: Thornton Park   Encounter Date: 10/22/2017  PT End of Session - 10/22/17 1334    Visit Number  29    Number of Visits  32    Date for PT Re-Evaluation  11/11/17    PT Start Time  1300    PT Stop Time  1345    PT Time Calculation (min)  45 min    Activity Tolerance  Patient tolerated treatment well    Behavior During Therapy  [facility] for tasks assessed/performed       Past Medical History:  Diagnosis Date  . Arthritis    Osteoarthritis  . Breast discharge 04/2017   when expressed  . Diverticulitis 2017  . PONV (postoperative nausea and vomiting)   . Temporary low platelet count (HCC)    H/O    Past Surgical History:  Procedure Laterality Date  . COLONOSCOPY WITH PROPOFOL N/A 12/15/2015   Procedure: COLONOSCOPY WITH PROPOFOL;  Surgeon: Manya Silvas, MD;  Location: [facility] ENDOSCOPY;  Service: Endoscopy;  Laterality: N/A;  . KNEE ARTHROSCOPY WITH MEDIAL MENISECTOMY Left 10/31/2016   Procedure: KNEE ARTHROSCOPY WITH MEDIAL MENISECTOMY;  Surgeon: Thornton Park, MD;  Location: [facility] ORS;  Service: Orthopedics;  Laterality: Left;  . TOTAL KNEE ARTHROPLASTY Left 06/13/2017   Procedure: TOTAL KNEE ARTHROPLASTY;  Surgeon: Thornton Park, MD;  Location: [facility] ORS;  Service: Orthopedics;  Laterality: Left;  . TUBAL LIGATION      There were no vitals filed for this visit.  Subjective Assessment - 10/22/17 1329    Subjective  Patient reports increased pain at night after the iontophoresis the previous session. Patient reports she continues to feel the increased stiffness today.     Pertinent History  PMH: diverticulitis    Limitations  Standing    How long can you stand comfortably?  63min    Diagnostic tests  X-Ray    Patient Stated Goals  Return to dialy activities; dance     Currently in Pain?  No/denies    Pain Onset  More than a month ago       Treatment   Manual Therapy: STM performed to hamstrings and quadriceps musculature to decrease pain and spasms with patient in sitting throughout session. Performed effleurage to the knee with patient in sitting to improve pain and spasms.   Therapeutic Exercise: Leg Press at [facility] -- 2x30 35# to increase strength and endurance of the LEs  TRX squats- 2x20 B to increase LE strength and endurance Standing Hamstring flexion against YTB - 2 x 20  Tandem heel raises and toe raises - x20   Modalities: High volt (unbilled at end of session) (4) large electrodes placed along the borders of the knee to decrease increased pain and spasms with large moist heat pad placed on top of the L knee. With high volt utilized for 20 min. Patient demonstrates no adverse skin changes during or after session.    Patient demonstrates increased fatigue with exercise at end of session.   PT Education - 10/22/17 1333    Education provided  Yes    Education Details  form/technique with exercise     Person(s) Educated  Patient    Methods  Explanation;Demonstration    Comprehension  Verbalized understanding;Returned demonstration  PT Long Term Goals - 10/14/17 1655      PT LONG TERM GOAL #1   Title  Patient will be independent with HEP focused on improving limitations to return to prior level of function.     Baseline  Dependent with form/technique    Time  6    Period  Weeks    Status  On-going      PT LONG TERM GOAL #2   Title  Patient will improve 45mWT to >1.0 m/s without use of AD to demonstrate significant improvement in function    Baseline  .3 m/s with FWW; .71 without AD; 09/15/2017: 1.84m/s    Time  6    Period  Weeks    Status  Achieved      PT LONG TERM GOAL #3   Title  Pt will demonstrate knee AROM of extension: 0 and  flexion >120deg to demonstrate greater ability to walk without increase in pain    Baseline  extension 10 (from neutral); flexion: 80deg; 07/31/17: 6 from neutral, 105 flexion; 09/15/2017: 5 form neutral, 110 flexion; 10/14/2017: 3 from neutral, 115 flexion    Time  6    Period  Weeks    Status  On-going      PT LONG TERM GOAL #4   Title  Patient will be able to stand 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 Patient is having overall less pain with exercises but continues to have resting tightness and increased difficulty at rest. Patient demonstrates subtle improvement with function but continues to have increased tightness and pain. Patient will benefit from further skilled therapy focused on improving limitations to return to prior level of function.     Rehab Potential  Good    Clinical Impairments Affecting Rehab Potential  (+) highly motivated, family support (-) Previous knee pain    PT Frequency  2x / week    PT Duration  6 weeks    PT Treatment/Interventions  Dry needling;Taping;Passive range of motion;Manual techniques;Neuromuscular re-education;Gait training;Stair training;Therapeutic activities;Therapeutic exercise;Balance training;Patient/family education;Moist Heat;Ultrasound;Electrical Stimulation;Cryotherapy;Aquatic Therapy;Iontophoresis 4mg /ml Dexamethasone    PT Next Visit Plan  Progress strengthening and AROM    PT Home Exercise Plan  See education section    Consulted and Agree with Plan of Care  Patient       Patient will benefit from skilled therapeutic intervention in order to  improve the following deficits and impairments:  Abnormal gait, Increased fascial restricitons, Pain, Increased muscle spasms, Decreased coordination, Decreased endurance, Decreased range of motion, Decreased strength, Decreased balance, Decreased mobility  Visit Diagnosis: Left knee pain, unspecified chronicity  Stiffness of left knee, not elsewhere classified  Other lack of coordination     Problem List Patient Active Problem List   Diagnosis Date Noted  . S/P total knee arthroplasty, left 06/13/2017  . Bruising 03/25/2017  . Pre-op evaluation 10/25/2016  . Bilateral knee pain 11/01/2015  . Thrombocytopenia ([place]) 11/01/2015  . AP (abdominal pain)   . Diverticulitis large intestine  10/01/2015    Blythe Stanford, PT DPT 10/22/2017, 3:46 PM  [facility] [address] [place], [address] Phone: [phone]   Fax:  [phone]  Name: Channah Godeaux MRN: [id number] Date of Birth: 08/31/1955

## 2017-10-27 ENCOUNTER — Other Ambulatory Visit: Payer: Self-pay | Admitting: Orthopedic Surgery

## 2017-10-27 ENCOUNTER — Ambulatory Visit: Payer: 59

## 2017-10-27 DIAGNOSIS — M25662 Stiffness of left knee, not elsewhere classified: Secondary | ICD-10-CM

## 2017-10-27 DIAGNOSIS — M25562 Pain in left knee: Secondary | ICD-10-CM | POA: Diagnosis not present

## 2017-10-27 DIAGNOSIS — R278 Other lack of coordination: Secondary | ICD-10-CM

## 2017-10-27 NOTE — Therapy (Signed)
[facility] [address] [place], [address] Phone: [phone]   Fax:  [phone]  Physical Therapy Treatment  Patient Details  Name: Rebecca Watkins MRN: [id number] Date of Birth: January 19, 1956 Referring Provider: Thornton Park   Encounter Date: 10/27/2017  PT End of Session - 10/27/17 1719    Visit Number  30    Number of Visits  32    Date for PT Re-Evaluation  11/11/17    PT Start Time  1517    PT Stop Time  1600    PT Time Calculation (min)  45 min    Activity Tolerance  Patient tolerated treatment well    Behavior During Therapy  [facility] for tasks assessed/performed       Past Medical History:  Diagnosis Date  . Arthritis    Osteoarthritis  . Breast discharge 04/2017   when expressed  . Diverticulitis 2017  . PONV (postoperative nausea and vomiting)   . Temporary low platelet count (HCC)    H/O    Past Surgical History:  Procedure Laterality Date  . COLONOSCOPY WITH PROPOFOL N/A 12/15/2015   Procedure: COLONOSCOPY WITH PROPOFOL;  Surgeon: Manya Silvas, MD;  Location: [facility] ENDOSCOPY;  Service: Endoscopy;  Laterality: N/A;  . KNEE ARTHROSCOPY WITH MEDIAL MENISECTOMY Left 10/31/2016   Procedure: KNEE ARTHROSCOPY WITH MEDIAL MENISECTOMY;  Surgeon: Thornton Park, MD;  Location: [facility] ORS;  Service: Orthopedics;  Laterality: Left;  . TOTAL KNEE ARTHROPLASTY Left 06/13/2017   Procedure: TOTAL KNEE ARTHROPLASTY;  Surgeon: Thornton Park, MD;  Location: [facility] ORS;  Service: Orthopedics;  Laterality: Left;  . TUBAL LIGATION      There were no vitals filed for this visit.  Subjective Assessment - 10/27/17 1618    Subjective  Patient reports she saw her orthopedic MD who reports he does not know why she continues to have increased pain. Patient states she continues to have stiffness in knee and increased pain with lying on her L hip.     Pertinent History  PMH: diverticulitis    Limitations   Standing    How long can you stand comfortably?  43min    Diagnostic tests  X-Ray    Patient Stated Goals  Return to dialy activities; dance     Currently in Pain?  No/denies    Pain Onset  More than a month ago         TREATMENT Therapeutic Exercise TRX squats with UE support - x 20  Wedding marches with B 10# weight B - 4 x 63ft Standing hip abduction - x 20 B with RTB Standing hip extension - x 20 B RTB  Manual Therapy: STM performed to hamstrings and quadriceps musculature to decrease pain and spasms with patient in sitting throughout session. Performed effleurage to the knee with patient in sitting to improve pain and spasms.  STM to patient's left hip with her positioned in sidelying to decrease pain and spasms utilizing superficial techniques  Patient demonstrates decreased pain at the end of the session   PT Education - 10/27/17 1717    Education provided  Yes    Education Details  form/technique with exercise    Person(s) Educated  Patient    Methods  Explanation;Demonstration    Comprehension  Verbalized understanding;Returned demonstration          PT Long Term Goals - 10/14/17 1655      PT LONG TERM GOAL #1   Title  Patient  will be independent with HEP focused on improving limitations to return to prior level of function.     Baseline  Dependent with form/technique    Time  6    Period  Weeks    Status  On-going      PT LONG TERM GOAL #2   Title  Patient will improve 1mWT to >1.0 m/s without use of AD to demonstrate significant improvement in function    Baseline  .3 m/s with FWW; .71 without AD; 09/15/2017: 1.44m/s    Time  6    Period  Weeks    Status  Achieved      PT LONG TERM GOAL #3   Title  Pt will demonstrate knee AROM of extension: 0 and flexion >120deg to demonstrate greater ability to walk without increase in pain    Baseline  extension 10 (from neutral); flexion: 80deg; 07/31/17: 6 from neutral, 105 flexion; 09/15/2017: 5 form neutral, 110  flexion; 10/14/2017: 3 from neutral, 115 flexion    Time  6    Period  Weeks    Status  On-going      PT LONG TERM GOAL #4   Title  Patient 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 Patient demonstrates minor decrease in pain after performing STM to the hip musculature. Continued to address decreased hip /knee strength and patient will benefit from further skilled therapy to return to prior level of function.     Rehab Potential  Good    Clinical Impairments Affecting Rehab Potential  (+) highly motivated, family support (-) Previous knee pain    PT Frequency  2x / week    PT Duration  6 weeks    PT Treatment/Interventions  Dry needling;Taping;Passive range of motion;Manual techniques;Neuromuscular re-education;Gait training;Stair training;Therapeutic activities;Therapeutic exercise;Balance training;Patient/family education;Moist Heat;Ultrasound;Electrical Stimulation;Cryotherapy;Aquatic Therapy;Iontophoresis 4mg /ml Dexamethasone    PT Next Visit Plan  Progress strengthening and AROM    PT Home Exercise Plan  See education section    Consulted and Agree with Plan of Care  Patient       Patient will benefit from skilled therapeutic intervention in order to improve the following deficits and impairments:  Abnormal gait, Increased fascial restricitons, Pain, Increased muscle spasms, Decreased coordination, Decreased endurance, Decreased range of motion, Decreased strength, Decreased balance, Decreased mobility  Visit Diagnosis: Left knee pain, unspecified  chronicity  Stiffness of left knee, not elsewhere classified  Other lack of coordination     Problem List Patient Active Problem List   Diagnosis Date Noted  . S/P total knee arthroplasty, left 06/13/2017  . Bruising 03/25/2017  . Pre-op evaluation 10/25/2016  . Bilateral knee pain 11/01/2015  . Thrombocytopenia ([place]) 11/01/2015  . AP (abdominal pain)   . Diverticulitis large intestine 10/01/2015    Blythe Stanford, PT DPT 10/27/2017, 5:23 PM  [facility] [address] [place], [address] Phone: [phone]  Fax:  [phone]  Name: Rebecca Watkins MRN: [id number] Date of Birth: 1956/01/02

## 2017-10-29 ENCOUNTER — Ambulatory Visit: Payer: 59

## 2017-10-29 ENCOUNTER — Other Ambulatory Visit
Admission: RE | Admit: 2017-10-29 | Discharge: 2017-10-29 | Disposition: A | Discharge status: Home / Self Care | Payer: 59 | Source: Ambulatory Visit | Attending: Orthopedic Surgery | Admitting: Orthopedic Surgery

## 2017-10-29 DIAGNOSIS — M25562 Pain in left knee: Secondary | ICD-10-CM

## 2017-10-29 DIAGNOSIS — M25662 Stiffness of left knee, not elsewhere classified: Secondary | ICD-10-CM

## 2017-10-29 DIAGNOSIS — R278 Other lack of coordination: Secondary | ICD-10-CM | POA: Diagnosis not present

## 2017-10-29 LAB — CBC WITH DIFFERENTIAL/PLATELET
Basophils Absolute: 0 10*3/uL (ref 0–0.1)
Basophils Relative: 1 %
Eosinophils Absolute: 0.1 10*3/uL (ref 0–0.7)
Eosinophils Relative: 2 %
HCT: 38.9 % (ref 35.0–47.0)
HEMOGLOBIN: 12.8 g/dL (ref 12.0–16.0)
LYMPHS ABS: 1.1 10*3/uL (ref 1.0–3.6)
LYMPHS PCT: 30 %
MCH: 30.6 pg (ref 26.0–34.0)
MCHC: 32.8 g/dL (ref 32.0–36.0)
MCV: 93.2 fL (ref 80.0–100.0)
MONOS PCT: 8 %
Monocytes Absolute: 0.3 10*3/uL (ref 0.2–0.9)
NEUTROS PCT: 59 %
Neutro Abs: 2.2 10*3/uL (ref 1.4–6.5)
Platelets: 141 10*3/uL — ABNORMAL LOW (ref 150–440)
RBC: 4.18 MIL/uL (ref 3.80–5.20)
RDW: 14 % (ref 11.5–14.5)
WBC: 3.7 10*3/uL (ref 3.6–11.0)

## 2017-10-29 LAB — C-REACTIVE PROTEIN: CRP: <0.8 mg/dL (ref <1.0)

## 2017-10-29 LAB — SEDIMENTATION RATE: Sed Rate: 6 mm/hr (ref 0–30)

## 2017-10-29 NOTE — Therapy (Signed)
[place] [facility] [address] [place], [address] Phone: [phone]   Fax:  [phone]  Physical Therapy Treatment  Patient Details  Name: Rebecca Watkins MRN: [id number] Date of Birth: 1955-10-29 Referring Provider: Thornton Park   Encounter Date: 10/29/2017  PT End of Session - 10/29/17 1536    Visit Number  31    Number of Visits  40    Date for PT Re-Evaluation  11/11/17    PT Start Time  2637    PT Stop Time  1430    PT Time Calculation (min)  45 min    Activity Tolerance  Patient tolerated treatment well    Behavior During Therapy  [facility] for tasks assessed/performed       Past Medical History:  Diagnosis Date  . Arthritis    Osteoarthritis  . Breast discharge 04/2017   when expressed  . Diverticulitis 2017  . PONV (postoperative nausea and vomiting)   . Temporary low platelet count (HCC)    H/O    Past Surgical History:  Procedure Laterality Date  . COLONOSCOPY WITH PROPOFOL N/A 12/15/2015   Procedure: COLONOSCOPY WITH PROPOFOL;  Surgeon: Manya Silvas, MD;  Location: [facility] ENDOSCOPY;  Service: Endoscopy;  Laterality: N/A;  . KNEE ARTHROSCOPY WITH MEDIAL MENISECTOMY Left 10/31/2016   Procedure: KNEE ARTHROSCOPY WITH MEDIAL MENISECTOMY;  Surgeon: Thornton Park, MD;  Location: [facility] ORS;  Service: Orthopedics;  Laterality: Left;  . TOTAL KNEE ARTHROPLASTY Left 06/13/2017   Procedure: TOTAL KNEE ARTHROPLASTY;  Surgeon: Thornton Park, MD;  Location: [facility] ORS;  Service: Orthopedics;  Laterality: Left;  . TUBAL LIGATION      There were no vitals filed for this visit.  Subjective Assessment - 10/29/17 1423    Subjective  Patient reports her hip felt better after the last visit. Patient states her knee continues to feel stiff.     Pertinent History  PMH: diverticulitis    Limitations  Standing    How long can you stand comfortably?  11min    Diagnostic tests  X-Ray    Patient Stated Goals   Return to dialy activities; dance     Currently in Pain?  No/denies    Pain Onset  More than a month ago       TREATMENT: Therapeutic Exercise Hip abduction in sidelying - 2 x 20 Clamshells in sidelying - 2 x 20  Leg press for quadriceps - 45# 2 x 20 Single leg stance with ball toss in standing - 2 x 20  Manual Therapy STM performed to hamstrings and quadriceps musculature to decrease pain and spasms with patient in sitting throughout session. Performed effleurage to the knee with patient in sitting to improve pain and spasms.   STM to patient's left hip with her positioned in sidelying to decrease pain and spasms utilizing superficial techniques  Patient demonstrates improvement with pain   PT Education - 10/29/17 1535    Education provided  Yes    Education Details  form/technique with exercise    Person(s) Educated  Patient    Methods  Explanation;Demonstration    Comprehension  Verbalized understanding;Returned demonstration          PT Long Term Goals - 10/14/17 1655      PT LONG TERM GOAL #1   Title  Patient will be independent with HEP focused on improving limitations to return to prior level of function.     Baseline  Dependent with form/technique  Time  6    Period  Weeks    Status  On-going      PT LONG TERM GOAL #2   Title  Patient will improve 29mWT to >1.0 m/s without use of AD to demonstrate significant improvement in function    Baseline  .3 m/s with FWW; .71 without AD; 09/15/2017: 1.25m/s    Time  6    Period  Weeks    Status  Achieved      PT LONG TERM GOAL #3   Title  Pt will demonstrate knee AROM of extension: 0 and flexion >120deg to demonstrate greater ability to walk without increase in pain    Baseline  extension 10 (from neutral); flexion: 80deg; 07/31/17: 6 from neutral, 105 flexion; 09/15/2017: 5 form neutral, 110 flexion; 10/14/2017: 3 from neutral, 115 flexion    Time  6    Period  Weeks    Status  On-going      PT LONG TERM GOAL #4    Title  Patient will be able to stand for over 2 hours without AD 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 Patient demonstrates improvement with ability to perform a greater amount of weight with leg press exercises. Although patient is improving, she continues to have increased knee stiffness and will benefit from further skilled therapy to return to prior level of function.     Rehab Potential  Good    Clinical Impairments Affecting Rehab Potential  (+) highly motivated, family support (-) Previous knee pain    PT Frequency  2x / week    PT Duration  6 weeks    PT Treatment/Interventions  Dry needling;Taping;Passive range of motion;Manual techniques;Neuromuscular re-education;Gait training;Stair training;Therapeutic activities;Therapeutic exercise;Balance training;Patient/family education;Moist Heat;Ultrasound;Electrical Stimulation;Cryotherapy;Aquatic Therapy;Iontophoresis 4mg /ml Dexamethasone    PT Next Visit Plan  Progress strengthening and AROM    PT Home Exercise Plan  See education section    Consulted and Agree with Plan of Care  Patient       Patient will benefit from skilled therapeutic intervention in order to improve the following deficits and impairments:  Abnormal gait, Increased fascial restricitons, Pain, Increased muscle spasms, Decreased coordination, Decreased endurance, Decreased range of motion, Decreased strength, Decreased balance, Decreased mobility  Visit Diagnosis: Left knee pain, unspecified chronicity  Stiffness of left knee, not elsewhere classified  Other lack of  coordination     Problem List Patient Active Problem List   Diagnosis Date Noted  . S/P total knee arthroplasty, left 06/13/2017  . Bruising 03/25/2017  . Pre-op evaluation 10/25/2016  . Bilateral knee pain 11/01/2015  . Thrombocytopenia ([place]) 11/01/2015  . AP (abdominal pain)   . Diverticulitis large intestine 10/01/2015    Blythe Stanford, PT DPT 10/29/2017, 3:45 PM  [place] [facility] [address] [place], [address] Phone: [phone]   Fax:  [phone]  Name: Rebecca Watkins MRN: [id number] Date of Birth: [birth year]

## 2017-11-03 ENCOUNTER — Ambulatory Visit: Payer: 59

## 2017-11-03 DIAGNOSIS — M25662 Stiffness of left knee, not elsewhere classified: Secondary | ICD-10-CM | POA: Diagnosis not present

## 2017-11-03 DIAGNOSIS — M25562 Pain in left knee: Secondary | ICD-10-CM

## 2017-11-03 DIAGNOSIS — R278 Other lack of coordination: Secondary | ICD-10-CM

## 2017-11-03 NOTE — Therapy (Signed)
[place] [facility] [address], [place], [address] Phone: [phone]   Fax:  [phone]  Physical Therapy Treatment  Patient Details  Name: Rebecca Watkins MRN: [id number] Date of Birth: 1955-09-28 Referring Provider: Thornton Park   Encounter Date: 11/03/2017  PT End of Session - 11/03/17 1234    Visit Number  32    Number of Visits  40    Date for PT Re-Evaluation  11/11/17    PT Start Time  1115    PT Stop Time  1200    PT Time Calculation (min)  45 min    Activity Tolerance  Patient tolerated treatment well    Behavior During Therapy  [facility] for tasks assessed/performed       Past Medical History:  Diagnosis Date  . Arthritis    Osteoarthritis  . Breast discharge 04/2017   when expressed  . Diverticulitis 2017  . PONV (postoperative nausea and vomiting)   . Temporary low platelet count (HCC)    H/O    Past Surgical History:  Procedure Laterality Date  . COLONOSCOPY WITH PROPOFOL N/A 12/15/2015   Procedure: COLONOSCOPY WITH PROPOFOL;  Surgeon: Manya Silvas, MD;  Location: [facility] ENDOSCOPY;  Service: Endoscopy;  Laterality: N/A;  . KNEE ARTHROSCOPY WITH MEDIAL MENISECTOMY Left 10/31/2016   Procedure: KNEE ARTHROSCOPY WITH MEDIAL MENISECTOMY;  Surgeon: Thornton Park, MD;  Location: [facility] ORS;  Service: Orthopedics;  Laterality: Left;  . TOTAL KNEE ARTHROPLASTY Left 06/13/2017   Procedure: TOTAL KNEE ARTHROPLASTY;  Surgeon: Thornton Park, MD;  Location: [facility] ORS;  Service: Orthopedics;  Laterality: Left;  . TUBAL LIGATION      There were no vitals filed for this visit.  Subjective Assessment - 11/03/17 1212    Subjective  Patient reports her hip sis overall feeling 'good' but continues to have the increased stiffness sensation in her knee.     Pertinent History  PMH: diverticulitis    Limitations  Standing    How long can you stand comfortably?  47min    Diagnostic tests  X-Ray    Patient  Stated Goals  Return to dialy activities; dance     Currently in Pain?  No/denies    Pain Onset  More than a month ago       TREATMENT: Therapeutic Exercise Hip abduction in sidelying - 2 x 20 Clamshells in sidelying - 2 x 20  Leg press for quadriceps - 35# 2 x 20 LAQ with 5# -- x 30 Walking with 10# weights holding B - 3 x 112ft Squats with TRX straps - x 20   Manual Therapy   STM to patient's left hip with her positioned in sidelying to decrease pain and spasms utilizing superficial techniques   Patient demonstrates increased fatigue with exercise    PT Education - 11/03/17 1233    Education provided  Yes    Education Details  form/technique with exercise    Person(s) Educated  Patient    Methods  Explanation;Demonstration    Comprehension  Verbalized understanding;Returned demonstration          PT Long Term Goals - 10/14/17 1655      PT LONG TERM GOAL #1   Title  Patient will be independent with HEP focused on improving limitations to return to prior level of function.     Baseline  Dependent with form/technique    Time  6    Period  Weeks    Status  On-going  PT LONG TERM GOAL #2   Title  Patient will improve 73mWT to >1.0 m/s without use of AD to demonstrate significant improvement in function    Baseline  .3 m/s with FWW; .71 without AD; 09/15/2017: 1.38m/s    Time  6    Period  Weeks    Status  Achieved      PT LONG TERM GOAL #3   Title  Pt will demonstrate knee AROM of extension: 0 and flexion >120deg to demonstrate greater ability to walk without increase in pain    Baseline  extension 10 (from neutral); flexion: 80deg; 07/31/17: 6 from neutral, 105 flexion; 09/15/2017: 5 form neutral, 110 flexion; 10/14/2017: 3 from neutral, 115 flexion    Time  6    Period  Weeks    Status  On-going      PT LONG TERM GOAL #4   Title  Patient will be able to stand for over 2 hours without AD 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 Patient demonstrates good form with exercises only requiring cueing to improve hip positioning with performing of leg press machine. Patient will benefit from further skilled therapy focused on improving limitations to return to prior level of function.     Rehab Potential  Good    Clinical Impairments Affecting Rehab Potential  (+) highly motivated, family support (-) Previous knee pain    PT Frequency  2x / week    PT Duration  6 weeks    PT Treatment/Interventions  Dry needling;Taping;Passive range of motion;Manual techniques;Neuromuscular re-education;Gait training;Stair training;Therapeutic activities;Therapeutic exercise;Balance training;Patient/family education;Moist Heat;Ultrasound;Electrical Stimulation;Cryotherapy;Aquatic Therapy;Iontophoresis 4mg /ml Dexamethasone    PT Next Visit Plan  Progress strengthening and AROM    PT Home Exercise Plan  See education section    Consulted and Agree with Plan of Care  Patient       Patient will benefit from skilled therapeutic intervention in order to improve the following deficits and impairments:  Abnormal gait, Increased fascial restricitons, Pain, Increased muscle spasms, Decreased coordination, Decreased endurance, Decreased range of motion, Decreased strength, Decreased balance, Decreased mobility  Visit Diagnosis: Stiffness of left knee, not elsewhere classified  Left knee pain, unspecified chronicity  Other lack of coordination     Problem List Patient Active Problem List   Diagnosis Date Noted  . S/P total knee arthroplasty, left 06/13/2017  . Bruising  03/25/2017  . Pre-op evaluation 10/25/2016  . Bilateral knee pain 11/01/2015  . Thrombocytopenia ([place]) 11/01/2015  . AP (abdominal pain)   . Diverticulitis large intestine 10/01/2015    Blythe Stanford, PT DPT 11/03/2017, 12:39 PM  [place] [facility] [address] [place], [address] Phone: [phone]   Fax:  [phone]  Name: Rebecca Watkins MRN: [id number] Date of Birth: Apr 30, 1956

## 2017-11-05 ENCOUNTER — Ambulatory Visit: Payer: 59

## 2017-11-05 DIAGNOSIS — M25562 Pain in left knee: Secondary | ICD-10-CM

## 2017-11-05 DIAGNOSIS — R278 Other lack of coordination: Secondary | ICD-10-CM | POA: Diagnosis not present

## 2017-11-05 DIAGNOSIS — M25662 Stiffness of left knee, not elsewhere classified: Secondary | ICD-10-CM | POA: Diagnosis not present

## 2017-11-05 NOTE — Therapy (Signed)
[place] [facility] [address] [place], [address] Phone: [phone]   Fax:  [phone]  Physical Therapy Treatment  Patient Details  Name: Rebecca Watkins MRN: [id number] Date of Birth: 12-08-1955 Referring Provider: Thornton Park   Encounter Date: 11/05/2017  PT End of Session - 11/05/17 1437    Visit Number  33    Number of Visits  40    Date for PT Re-Evaluation  11/11/17    PT Start Time  7893    PT Stop Time  1430    PT Time Calculation (min)  45 min    Activity Tolerance  Patient tolerated treatment well    Behavior During Therapy  [facility] for tasks assessed/performed       Past Medical History:  Diagnosis Date  . Arthritis    Osteoarthritis  . Breast discharge 04/2017   when expressed  . Diverticulitis 2017  . PONV (postoperative nausea and vomiting)   . Temporary low platelet count (HCC)    H/O    Past Surgical History:  Procedure Laterality Date  . COLONOSCOPY WITH PROPOFOL N/A 12/15/2015   Procedure: COLONOSCOPY WITH PROPOFOL;  Surgeon: Manya Silvas, MD;  [facility] END[facility]COPY;  Service: Endoscopy;  Laterality: N/A;  . KNEE ARTHROSCOPY WITH MEDIAL MENISECTOMY Left 10/31/2016   Procedure: KNEE ARTHROSCOPY WITH MEDIAL MENISECTOMY;  Surgeon: Thornton Park, MD;  Location: [facility] ORS;  Service: Orthopedics;  Laterality: Left;  . TOTAL KNEE ARTHROPLASTY Left 06/13/2017   Procedure: TOTAL KNEE ARTHROPLASTY;  Surgeon: Thornton Park, MD;  Location: [facility] ORS;  Service: Orthopedics;  Laterality: Left;  . TUBAL LIGATION      There were no vitals filed for this visit.  Subjective Assessment - 11/05/17 1419    Subjective  Patient reports her hip continues to feel better but reports continuous stiffness sensation along the knee.     Pertinent History  PMH: diverticulitis    Limitations  Standing    How long can you stand comfortably?  5min    Diagnostic tests  X-Ray    Patient Stated Goals   Return to dialy activities; dance     Currently in Pain?  No/denies    Pain Onset  More than a month ago       TREATMENT: Therapeutic Exercise Leg press for quadriceps - 45# 2 x 20 Squats with TRX straps - x 20  Hip abduction with slider - x 20 B Hip semi circles with slider - x 20 B   Manual Therapy   STM to patient's left knee with her positioned in sitting to decrease pain and spasms utilizing superficial techniques   Patient demonstrates increased fatigue with exercise   PT Education - 11/05/17 1436    Education provided  Yes    Education Details  form/technique with exercise    Person(s) Educated  Patient    Methods  Explanation;Demonstration    Comprehension  Verbalized understanding;Returned demonstration          PT Long Term Goals - 10/14/17 1655      PT LONG TERM GOAL #1   Title  Patient will be independent with HEP focused on improving limitations to return to prior level of function.     Baseline  Dependent with form/technique    Time  6    Period  Weeks    Status  On-going      PT LONG TERM GOAL #2   Title  Patient will improve 38mWT  to >1.0 m/s without use of AD to demonstrate significant improvement in function    Baseline  .3 m/s with FWW; .71 without AD; 09/15/2017: 1.23m/s    Time  6    Period  Weeks    Status  Achieved      PT LONG TERM GOAL #3   Title  Pt will demonstrate knee AROM of extension: 0 and flexion >120deg to demonstrate greater ability to walk without increase in pain    Baseline  extension 10 (from neutral); flexion: 80deg; 07/31/17: 6 from neutral, 105 flexion; 09/15/2017: 5 form neutral, 110 flexion; 10/14/2017: 3 from neutral, 115 flexion    Time  6    Period  Weeks    Status  On-going      PT LONG TERM GOAL #4   Title  Patient will be able to stand for over 2 hours without AD 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 Patient will benefit from further skilled therapy focused on improving current limitations to return to prior level of function.     Rehab Potential  Good    Clinical Impairments Affecting Rehab Potential  (+) highly motivated, family support (-) Previous knee pain    PT Frequency  2x / week    PT Duration  6 weeks    PT Treatment/Interventions  Dry needling;Taping;Passive range of motion;Manual techniques;Neuromuscular re-education;Gait training;Stair training;Therapeutic activities;Therapeutic exercise;Balance training;Patient/family education;Moist Heat;Ultrasound;Electrical Stimulation;Cryotherapy;Aquatic Therapy;Iontophoresis 4mg /ml Dexamethasone    PT Next Visit Plan  Progress strengthening and AROM    PT Home Exercise Plan  See education section    Consulted and Agree with Plan of Care  Patient       Patient will benefit from skilled therapeutic intervention in order to improve the following deficits and impairments:  Abnormal gait, Increased fascial restricitons, Pain, Increased muscle spasms, Decreased coordination, Decreased endurance, Decreased range of motion, Decreased strength, Decreased balance, Decreased mobility  Visit Diagnosis: Stiffness of left knee, not elsewhere classified  Left knee pain, unspecified chronicity     Problem List Patient Active Problem List   Diagnosis Date Noted  . S/P total knee arthroplasty, left 06/13/2017  . Bruising 03/25/2017  . Pre-op evaluation 10/25/2016  . Bilateral knee pain 11/01/2015  . Thrombocytopenia ([place])  11/01/2015  . AP (abdominal pain)   . Diverticulitis large intestine 10/01/2015    Blythe Stanford, PT DPT 11/05/2017, 2:58 PM  [place] [facility] [address], [place], [address] Phone: [phone]   Fax:  [phone]  Name: Rebecca Watkins MRN: [id number] Date of Birth: 09/11/1955

## 2017-11-06 ENCOUNTER — Ambulatory Visit
Admission: RE | Admit: 2017-11-06 | Discharge: 2017-11-06 | Disposition: A | Discharge status: Home / Self Care | Payer: 59 | Source: Ambulatory Visit | Attending: Orthopedic Surgery | Admitting: Orthopedic Surgery

## 2017-11-06 ENCOUNTER — Encounter
Admission: RE | Admit: 2017-11-06 | Discharge: 2017-11-06 | Disposition: A | Discharge status: Home / Self Care | Payer: 59 | Source: Ambulatory Visit | Attending: Orthopedic Surgery | Admitting: Orthopedic Surgery

## 2017-11-06 DIAGNOSIS — R948 Abnormal results of function studies of other organs and systems: Secondary | ICD-10-CM | POA: Diagnosis not present

## 2017-11-06 DIAGNOSIS — M25562 Pain in left knee: Secondary | ICD-10-CM | POA: Diagnosis not present

## 2017-11-06 MED ORDER — TECHNETIUM TC 99M MEDRONATE IV KIT
22.0000 | PACK | Freq: Once | INTRAVENOUS | Status: AC | PRN
  Administered 2017-11-06: 22.879 via INTRAVENOUS

## 2017-11-10 ENCOUNTER — Ambulatory Visit: Payer: 59

## 2017-11-10 DIAGNOSIS — M25562 Pain in left knee: Secondary | ICD-10-CM

## 2017-11-10 DIAGNOSIS — R278 Other lack of coordination: Secondary | ICD-10-CM | POA: Diagnosis not present

## 2017-11-10 DIAGNOSIS — M25662 Stiffness of left knee, not elsewhere classified: Secondary | ICD-10-CM | POA: Diagnosis not present

## 2017-11-10 NOTE — Therapy (Signed)
[facility] [address], [place], [address] Phone: [phone]   Fax:  [phone]  Physical Therapy Treatment  Patient Details  Name: Rebecca Watkins MRN: [id number] Date of Birth: 01-26-1956 Referring Provider: Thornton Park   Encounter Date: 11/10/2017  PT End of Session - 11/10/17 1632    Visit Number  34    Number of Visits  40    Date for PT Re-Evaluation  11/11/17    PT Start Time  1600    PT Stop Time  1645    PT Time Calculation (min)  45 min    Activity Tolerance  Patient tolerated treatment well    Behavior During Therapy  [facility] for tasks assessed/performed       Past Medical History:  Diagnosis Date  . Arthritis    Osteoarthritis  . Breast discharge 04/2017   when expressed  . Diverticulitis 2017  . PONV (postoperative nausea and vomiting)   . Temporary low platelet count (HCC)    H/O    Past Surgical History:  Procedure Laterality Date  . COLONOSCOPY WITH PROPOFOL N/A 12/15/2015   Procedure: COLONOSCOPY WITH PROPOFOL;  Surgeon: Manya Silvas, MD;  Location: [facility] ENDOSCOPY;  Service: Endoscopy;  Laterality: N/A;  . KNEE ARTHROSCOPY WITH MEDIAL MENISECTOMY Left 10/31/2016   Procedure: KNEE ARTHROSCOPY WITH MEDIAL MENISECTOMY;  Surgeon: Thornton Park, MD;  Location: [facility] ORS;  Service: Orthopedics;  Laterality: Left;  . TOTAL KNEE ARTHROPLASTY Left 06/13/2017   Procedure: TOTAL KNEE ARTHROPLASTY;  Surgeon: Thornton Park, MD;  Location: [facility] ORS;  Service: Orthopedics;  Laterality: Left;  . TUBAL LIGATION      There were no vitals filed for this visit.  Subjective Assessment - 11/10/17 1627    Subjective  Patient reports the knee has been feeling okay but continues to feel increased stiffness at baseline.     Pertinent History  PMH: diverticulitis    Limitations  Standing    How long can you stand comfortably?  65min    Diagnostic tests  X-Ray    Patient Stated Goals   Return to dialy activities; dance     Currently in Pain?  No/denies    Pain Onset  More than a month ago       TREATMENT: Therapeutic Exercise Leg press for quadriceps - 45# 2 x 20 Squats with TRX straps - x 20  Hip abduction with YTB - x 20  Hip hikes off of 3" step - x 20  Hip semi circles with slider - x 20 B   Manual Therapy   STM to patient's left knee with her positioned in sitting to decrease pain and spasms utilizing superficial techniques   Patient demonstrates increased fatigue with exercise    PT Education - 11/10/17 1631    Education provided  Yes    Education Details  form/technique with exercise    Person(s) Educated  Patient    Methods  Explanation;Demonstration    Comprehension  Verbalized understanding;Returned demonstration          PT Long Term Goals - 10/14/17 1655      PT LONG TERM GOAL #1   Title  Patient will be independent with HEP focused on improving limitations to return to prior level of function.     Baseline  Dependent with form/technique    Time  6    Period  Weeks    Status  On-going      PT LONG  TERM GOAL #2   Title  Patient will improve 8mWT to >1.0 m/s without use of AD to demonstrate significant improvement in function    Baseline  .3 m/s with FWW; .71 without AD; 09/15/2017: 1.29m/s    Time  6    Period  Weeks    Status  Achieved      PT LONG TERM GOAL #3   Title  Pt will demonstrate knee AROM of extension: 0 and flexion >120deg to demonstrate greater ability to walk without increase in pain    Baseline  extension 10 (from neutral); flexion: 80deg; 07/31/17: 6 from neutral, 105 flexion; 09/15/2017: 5 form neutral, 110 flexion; 10/14/2017: 3 from neutral, 115 flexion    Time  6    Period  Weeks    Status  On-going      PT LONG TERM GOAL #4   Title  Patient will be able to stand for over 2 hours without AD 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 Patient deomnstrates poor coordination requiring tactile cueing to engage lateral hip musculature to improve quad activation and decrease knee pain with exercise. Patient will benefit from further skilled therapy focused on improving limitations to return to prior level of function.     Rehab Potential  Good    Clinical Impairments Affecting Rehab Potential  (+) highly motivated, family support (-) Previous knee pain    PT Frequency  2x / week    PT Duration  6 weeks    PT Treatment/Interventions  Dry needling;Taping;Passive range of motion;Manual techniques;Neuromuscular re-education;Gait training;Stair training;Therapeutic activities;Therapeutic exercise;Balance training;Patient/family education;Moist Heat;Ultrasound;Electrical Stimulation;Cryotherapy;Aquatic Therapy;Iontophoresis 4mg /ml Dexamethasone    PT Next Visit Plan  Progress strengthening and AROM    PT Home Exercise Plan  See education section    Consulted and Agree with Plan of Care  Patient       Patient will benefit from skilled therapeutic intervention in order to improve the following deficits and impairments:  Abnormal gait, Increased fascial restricitons, Pain, Increased muscle spasms, Decreased coordination, Decreased endurance, Decreased range of motion, Decreased strength, Decreased balance, Decreased mobility  Visit Diagnosis: Stiffness of left knee, not elsewhere classified  Left knee pain, unspecified chronicity  Other lack of coordination     Problem List Patient Active Problem List   Diagnosis Date Noted  . S/P total knee arthroplasty, left 06/13/2017  . Bruising 03/25/2017  . Pre-op evaluation 10/25/2016  . Bilateral knee pain 11/01/2015  .  Thrombocytopenia ([place]) 11/01/2015  . AP (abdominal pain)   . Diverticulitis large intestine 10/01/2015    Blythe Stanford, PT DPT 11/10/2017, 4:46 PM  [facility] [address], [place], [address] Phone: [phone]   Fax:  [phone]  Name: Rebecca Watkins MRN: [id number] Date of Birth: 1956/05/07

## 2017-11-12 ENCOUNTER — Ambulatory Visit: Payer: 59

## 2017-11-12 DIAGNOSIS — M25662 Stiffness of left knee, not elsewhere classified: Secondary | ICD-10-CM | POA: Diagnosis not present

## 2017-11-12 DIAGNOSIS — M25562 Pain in left knee: Secondary | ICD-10-CM | POA: Diagnosis not present

## 2017-11-12 DIAGNOSIS — R278 Other lack of coordination: Secondary | ICD-10-CM | POA: Diagnosis not present

## 2017-11-12 NOTE — Therapy (Signed)
[facility] [address], [place], [address] Phone: [phone]   Fax:  [phone]  Physical Therapy Treatment  Patient Details  Name: Rebecca Watkins MRN: [id number] Date of Birth: 12-Sep-1955 Referring Provider: Thornton Park   Encounter Date: 11/12/2017  PT End of Session - 11/12/17 1456    Visit Number  35    Number of Visits  40    Date for PT Re-Evaluation  11/11/17    PT Start Time  1300    PT Stop Time  1345    PT Time Calculation (min)  45 min    Activity Tolerance  Patient tolerated treatment well    Behavior During Therapy  [facility] for tasks assessed/performed       Past Medical History:  Diagnosis Date  . Arthritis    Osteoarthritis  . Breast discharge 04/2017   when expressed  . Diverticulitis 2017  . PONV (postoperative nausea and vomiting)   . Temporary low platelet count (HCC)    H/O    Past Surgical History:  Procedure Laterality Date  . COLONOSCOPY WITH PROPOFOL N/A 12/15/2015   Procedure: COLONOSCOPY WITH PROPOFOL;  Surgeon: Manya Silvas, MD;  Location: [facility] ENDOSCOPY;  Service: Endoscopy;  Laterality: N/A;  . KNEE ARTHROSCOPY WITH MEDIAL MENISECTOMY Left 10/31/2016   Procedure: KNEE ARTHROSCOPY WITH MEDIAL MENISECTOMY;  Surgeon: Thornton Park, MD;  Location: [facility] ORS;  Service: Orthopedics;  Laterality: Left;  . TOTAL KNEE ARTHROPLASTY Left 06/13/2017   Procedure: TOTAL KNEE ARTHROPLASTY;  Surgeon: Thornton Park, MD;  Location: [facility] ORS;  Service: Orthopedics;  Laterality: Left;  . TUBAL LIGATION      There were no vitals filed for this visit.  Subjective Assessment - 11/12/17 1448    Subjective  Patient reports the knee has been feeling okay but continues to feel increased stiffness at baseline.     Pertinent History  PMH: diverticulitis    Limitations  Standing    How long can you stand comfortably?  26min    Diagnostic tests  X-Ray    Patient Stated Goals   Return to dialy activities; dance     Currently in Pain?  No/denies    Pain Onset  More than a month ago       TREATMENT: Therapeutic Exercise Hip extension in standing at hip machine - x 20 55# Squats with TRX straps - x 40 Heel raises in standing - x30 with focus on maintaining a straight leg posture Step ups - x 20 with UE support with focus on improving    Manual Therapy   STM to patient's left knee with her positioned in sitting to decrease pain and spasms utilizing superficial techniques   Patient demonstrates increased fatigue with exercise   PT Education - 11/12/17 1456    Education provided  Yes    Education Details  form/technique with exercise    Person(s) Educated  Patient    Methods  Explanation;Demonstration    Comprehension  Returned demonstration;Verbalized understanding          PT Long Term Goals - 11/12/17 1457      PT LONG TERM GOAL #1   Title  Patient will be independent with HEP focused on improving limitations to return to prior level of function.     Baseline  Dependent with form/technique; independent with HEP    Time  6    Period  Weeks    Status  Achieved  PT LONG TERM GOAL #2   Title  Patient will improve 11mWT to >1.0 m/s without use of AD to demonstrate significant improvement in function    Baseline  .3 m/s with FWW; .71 without AD; 09/15/2017: 1.63m/s    Time  6    Period  Weeks    Status  Achieved      PT LONG TERM GOAL #3   Title  Pt will demonstrate knee AROM of extension: 0 and flexion >120deg to demonstrate greater ability to walk without increase in pain    Baseline  extension 10 (from neutral); flexion: 80deg; 07/31/17: 6 from neutral, 105 flexion; 09/15/2017: 5 form neutral, 110 flexion; 10/14/2017: 3 from neutral, 115 flexion; 11/12/2017: 1 deg from neutral and 119 of knee flexion     Time  6    Period  Weeks    Status  Achieved      PT LONG TERM GOAL #4   Title  Patient will be able to stand for over 2 hours without AD  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 Although patient is improving, she continues to have increased pain and stiffness along the patient's affected knee and in ability to perform standing activities for a long period of time. Patient will benefit from further skilled therapy to return to prior level of function.     Rehab Potential  Good    Clinical Impairments Affecting Rehab Potential  (+) highly motivated, family support (-) Previous knee pain    PT Frequency  2x / week    PT Duration  6 weeks    PT Treatment/Interventions  Dry needling;Taping;Passive range of motion;Manual techniques;Neuromuscular re-education;Gait training;Stair training;Therapeutic activities;Therapeutic exercise;Balance training;Patient/family education;Moist Heat;Ultrasound;Electrical Stimulation;Cryotherapy;Aquatic Therapy;Iontophoresis 4mg /ml Dexamethasone    PT Next Visit Plan  Progress strengthening and AROM    PT Home Exercise Plan  See education section    Consulted and Agree with Plan of Care  Patient       Patient will benefit from skilled therapeutic intervention in order to improve the following deficits and impairments:  Abnormal gait, Increased fascial restricitons, Pain, Increased muscle spasms, Decreased coordination, Decreased endurance, Decreased range of motion, Decreased strength, Decreased balance, Decreased mobility  Visit Diagnosis: Stiffness of left knee, not elsewhere classified  Left knee pain, unspecified chronicity  Other lack of coordination     Problem List Patient Active Problem List   Diagnosis Date  Noted  . S/P total knee arthroplasty, left 06/13/2017  . Bruising 03/25/2017  . Pre-op evaluation 10/25/2016  . Bilateral knee pain 11/01/2015  . Thrombocytopenia ([place]) 11/01/2015  . AP (abdominal pain)   . Diverticulitis large intestine 10/01/2015    Blythe Stanford, PT DPT 11/12/2017, 3:03 PM  [facility] [address] [place], [address] Phone: [phone]   Fax:  [phone]  Name: Cleaster Shiffer MRN: [id number] Date of Birth: 03/16/1956

## 2017-11-12 NOTE — Addendum Note (Signed)
Addended by: Blain Pais on: 11/12/2017 03:07 PM   Modules accepted: Orders

## 2017-11-17 ENCOUNTER — Ambulatory Visit: Payer: 59 | Attending: Orthopedic Surgery

## 2017-11-17 DIAGNOSIS — R278 Other lack of coordination: Secondary | ICD-10-CM | POA: Diagnosis not present

## 2017-11-17 DIAGNOSIS — M25662 Stiffness of left knee, not elsewhere classified: Secondary | ICD-10-CM | POA: Insufficient documentation

## 2017-11-17 DIAGNOSIS — M25562 Pain in left knee: Secondary | ICD-10-CM | POA: Insufficient documentation

## 2017-11-17 NOTE — Therapy (Signed)
[facility] [address], [place], [address] Phone: [phone]   Fax:  [phone]  Physical Therapy Treatment  Patient Details  Name: Rebecca Watkins MRN: [id number] Date of Birth: [birth year] Referring Provider: Thornton Park   Encounter Date: 11/17/2017  PT End of Session - 11/17/17 1324    Visit Number  36    Number of Visits  44    Date for PT Re-Evaluation  12/10/17    PT Start Time  1302    PT Stop Time  1345    PT Time Calculation (min)  43 min    Activity Tolerance  Patient tolerated treatment well    Behavior During Therapy  [facility] for tasks assessed/performed       Past Medical History:  Diagnosis Date  . Arthritis    Osteoarthritis  . Breast discharge 04/2017   when expressed  . Diverticulitis 2017  . PONV (postoperative nausea and vomiting)   . Temporary low platelet count (HCC)    H/O    Past Surgical History:  Procedure Laterality Date  . COLONOSCOPY WITH PROPOFOL N/A 12/15/2015   Procedure: COLONOSCOPY WITH PROPOFOL;  Surgeon: Manya Silvas, MD;  Location: [facility] ENDOSCOPY;  Service: Endoscopy;  Laterality: N/A;  . KNEE ARTHROSCOPY WITH MEDIAL MENISECTOMY Left 10/31/2016   Procedure: KNEE ARTHROSCOPY WITH MEDIAL MENISECTOMY;  Surgeon: Thornton Park, MD;  Location: [facility] ORS;  Service: Orthopedics;  Laterality: Left;  . TOTAL KNEE ARTHROPLASTY Left 06/13/2017   Procedure: TOTAL KNEE ARTHROPLASTY;  Surgeon: Thornton Park, MD;  Location: [facility] ORS;  Service: Orthopedics;  Laterality: Left;  . TUBAL LIGATION      There were no vitals filed for this visit.  Subjective Assessment - 11/17/17 1320    Subjective  Patient reports she continues to have knee tightness and read her knee report. Patient states she sees her MD tomorrow.     Pertinent History  PMH: diverticulitis    Limitations  Standing    How long can you stand comfortably?  19min    Diagnostic tests  X-Ray    Patient  Stated Goals  Return to dialy activities; dance     Currently in Pain?  No/denies    Pain Onset  More than a month ago          TREATMENT: Therapeutic Exercise Hip abduction with YTB around knees - 2x 20  Leg Press in sitting 45# -- 2 x 25 Step ups onto 4" step - 2 x 20 Hip external rotation with foot on a step - 2 x 20    Manual Therapy   STM to patient's left knee with her positioned in sitting to decrease pain and spasms utilizing superficial techniques   Patient demonstrates increased fatigue with exercise    PT Education - 11/17/17 1322    Education provided  Yes    Education Details  form/technique with exercise     Person(s) Educated  Patient    Methods  Explanation;Demonstration    Comprehension  Verbalized understanding;Returned demonstration          PT Long Term Goals - 11/12/17 1457      PT LONG TERM GOAL #1   Title  Patient will be independent with HEP focused on improving limitations to return to prior level of function.     Baseline  Dependent with form/technique; independent with HEP    Time  6    Period  Weeks    Status  Achieved      PT LONG TERM GOAL #2   Title  Patient will improve 60mWT to >1.0 m/s without use of AD to demonstrate significant improvement in function    Baseline  .3 m/s with FWW; .71 without AD; 09/15/2017: 1.54m/s    Time  6    Period  Weeks    Status  Achieved      PT LONG TERM GOAL #3   Title  Pt will demonstrate knee AROM of extension: 0 and flexion >120deg to demonstrate greater ability to walk without increase in pain    Baseline  extension 10 (from neutral); flexion: 80deg; 07/31/17: 6 from neutral, 105 flexion; 09/15/2017: 5 form neutral, 110 flexion; 10/14/2017: 3 from neutral, 115 flexion; 11/12/2017: 1 deg from neutral and 119 of knee flexion     Time  6    Period  Weeks    Status  Achieved      PT LONG TERM GOAL #4   Title  Patient will be able to stand for over 2 hours without 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 Patient continues to demonstrates increased knee pain with performing step ups and squatting requiring frequent cueing on knee positioning to perform without knee pain. Patient will benefit from further skilled therapy to return to prior level of function.     Rehab Potential  Good    Clinical Impairments Affecting Rehab Potential  (+) highly motivated, family support (-) Previous knee pain    PT Frequency  2x / week    PT Duration  6 weeks    PT Treatment/Interventions  Dry needling;Taping;Passive range of motion;Manual techniques;Neuromuscular re-education;Gait training;Stair training;Therapeutic activities;Therapeutic exercise;Balance training;Patient/family education;Moist Heat;Ultrasound;Electrical Stimulation;Cryotherapy;Aquatic Therapy;Iontophoresis 4mg /ml Dexamethasone    PT Next Visit Plan  Progress strengthening and AROM    PT Home Exercise Plan  See education section    Consulted and Agree with Plan of Care  Patient       Patient will benefit from skilled therapeutic intervention in order to improve the following deficits and impairments:  Abnormal gait, Increased fascial restricitons, Pain, Increased muscle spasms, Decreased coordination, Decreased endurance, Decreased range of motion, Decreased strength, Decreased balance, Decreased mobility  Visit Diagnosis: Stiffness of left knee, not elsewhere classified  Left knee pain, unspecified chronicity  Other lack of coordination     Problem List Patient Active Problem List   Diagnosis Date Noted  . S/P total knee arthroplasty, left  06/13/2017  . Bruising 03/25/2017  . Pre-op evaluation 10/25/2016  . Bilateral knee pain 11/01/2015  . Thrombocytopenia ([place]) 11/01/2015  . AP (abdominal pain)   . Diverticulitis large intestine 10/01/2015    Blythe Stanford, PT DPT 11/17/2017, 1:51 PM  [facility] [address], [place], [address] Phone: [phone]   Fax:  [phone]  Name: Lalita Ebel MRN: [id number] Date of Birth: 1955-11-06

## 2017-11-18 DIAGNOSIS — M25562 Pain in left knee: Secondary | ICD-10-CM | POA: Diagnosis not present

## 2017-11-20 ENCOUNTER — Ambulatory Visit: Payer: 59

## 2017-11-20 DIAGNOSIS — M25562 Pain in left knee: Secondary | ICD-10-CM

## 2017-11-20 DIAGNOSIS — M25662 Stiffness of left knee, not elsewhere classified: Secondary | ICD-10-CM | POA: Diagnosis not present

## 2017-11-20 DIAGNOSIS — R278 Other lack of coordination: Secondary | ICD-10-CM

## 2017-11-20 NOTE — Therapy (Signed)
[facility] [address], [place], [address] Phone: [phone]   Fax:  [phone]  Physical Therapy Treatment  Patient Details  Name: Rebecca Watkins MRN: [id number] Date of Birth: 03/02/1956 Referring Provider: Thornton Park   Encounter Date: 11/20/2017  PT End of Session - 11/20/17 1343    Visit Number  37    Number of Visits  44    Date for PT Re-Evaluation  12/10/17    PT Start Time  1300    PT Stop Time  1330    PT Time Calculation (min)  30 min    Activity Tolerance  Patient tolerated treatment well    Behavior During Therapy  [facility] for tasks assessed/performed       Past Medical History:  Diagnosis Date  . Arthritis    Osteoarthritis  . Breast discharge 04/2017   when expressed  . Diverticulitis 2017  . PONV (postoperative nausea and vomiting)   . Temporary low platelet count (HCC)    H/O    Past Surgical History:  Procedure Laterality Date  . COLONOSCOPY WITH PROPOFOL N/A 12/15/2015   Procedure: COLONOSCOPY WITH PROPOFOL;  Surgeon: Manya Silvas, MD;  Location: [facility] ENDOSCOPY;  Service: Endoscopy;  Laterality: N/A;  . KNEE ARTHROSCOPY WITH MEDIAL MENISECTOMY Left 10/31/2016   Procedure: KNEE ARTHROSCOPY WITH MEDIAL MENISECTOMY;  Surgeon: Thornton Park, MD;  Location: [facility] ORS;  Service: Orthopedics;  Laterality: Left;  . TOTAL KNEE ARTHROPLASTY Left 06/13/2017   Procedure: TOTAL KNEE ARTHROPLASTY;  Surgeon: Thornton Park, MD;  Location: [facility] ORS;  Service: Orthopedics;  Laterality: Left;  . TUBAL LIGATION      There were no vitals filed for this visit.  Subjective Assessment - 11/20/17 1340    Subjective  Patient reports she went to her MD's office and got a second opinion from a doctor in the same office. Patient reports her knee continues to feel stiff. Patient states the MD said her MD told her to stop Physical therapy and perform exercises at home. Patient report she would  like another opinion from another doctor.     Pertinent History  PMH: diverticulitis    Limitations  Standing    How long can you stand comfortably?  4min    Diagnostic tests  X-Ray    Patient Stated Goals  Return to dialy activities; dance     Currently in Pain?  No/denies    Pain Onset  More than a month ago       TREATMENT: Therapeutic Exercise: Single leg Deadlifts in standing with UE support - x 20  Single leg stance heel raises in standing - x 20  Lunges in standing - x 20 Educated on knee physiology and rates of healing after having a TKA. Educated on rates of performing HEP at home.    PT Education - 11/20/17 1342    Education provided  Yes    Education Details  form/technique exercise    Person(s) Educated  Patient    Methods  Explanation;Demonstration    Comprehension  Verbalized understanding;Returned demonstration          PT Long Term Goals - 11/12/17 1457      PT LONG TERM GOAL #1   Title  Patient will be independent with HEP focused on improving limitations to return to prior level of function.     Baseline  Dependent with form/technique; independent with HEP    Time  6    Period  Weeks    Status  Achieved      PT LONG TERM GOAL #2   Title  Patient will improve 59mWT to >1.0 m/s without use of AD to demonstrate significant improvement in function    Baseline  .3 m/s with FWW; .71 without AD; 09/15/2017: 1.36m/s    Time  6    Period  Weeks    Status  Achieved      PT LONG TERM GOAL #3   Title  Pt will demonstrate knee AROM of extension: 0 and flexion >120deg to demonstrate greater ability to walk without increase in pain    Baseline  extension 10 (from neutral); flexion: 80deg; 07/31/17: 6 from neutral, 105 flexion; 09/15/2017: 5 form neutral, 110 flexion; 10/14/2017: 3 from neutral, 115 flexion; 11/12/2017: 1 deg from neutral and 119 of knee flexion     Time  6    Period  Weeks    Status  Achieved      PT LONG TERM GOAL #4   Title  Patient will be  able to stand for over 2 hours without AD to allow for 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 Patient reports she's hesistant to stop therapy. Patient demosntrates improvement with exercise performance and requires only minor cueing on hip and knee positioning throughout. Stopping therapy secondary to doctor recommendation.     Rehab Potential  Good    Clinical Impairments Affecting Rehab Potential  (+) highly motivated, family support (-) Previous knee pain    PT Frequency  2x / week    PT Duration  6 weeks    PT Treatment/Interventions  Dry needling;Taping;Passive range of motion;Manual techniques;Neuromuscular re-education;Gait training;Stair training;Therapeutic activities;Therapeutic exercise;Balance training;Patient/family education;Moist Heat;Ultrasound;Electrical Stimulation;Cryotherapy;Aquatic Therapy;Iontophoresis 4mg /ml Dexamethasone    PT Next Visit Plan  Progress strengthening and AROM    PT Home Exercise Plan  See education section    Consulted and Agree with Plan of Care  Patient       Patient will benefit from skilled therapeutic intervention in order to improve the following deficits and impairments:  Abnormal gait, Increased fascial restricitons, Pain, Increased muscle spasms, Decreased coordination, Decreased endurance, Decreased range of motion, Decreased strength, Decreased balance, Decreased mobility  Visit Diagnosis: Stiffness of left knee, not elsewhere classified  Left knee pain, unspecified chronicity  Other lack of coordination     Problem List Patient Active Problem List   Diagnosis Date Noted   . S/P total knee arthroplasty, left 06/13/2017  . Bruising 03/25/2017  . Pre-op evaluation 10/25/2016  . Bilateral knee pain 11/01/2015  . Thrombocytopenia ([place]) 11/01/2015  . AP (abdominal pain)   . Diverticulitis large intestine 10/01/2015    Blythe Stanford, PT DPT 11/20/2017, 2:30 PM  [facility] [address] [place], [address] Phone: [phone]   Fax:  [phone]  Name: Rebecca Watkins MRN: [id number] Date of Birth: 08-15-1956

## 2017-11-24 ENCOUNTER — Ambulatory Visit: Payer: 59

## 2018-04-23 DIAGNOSIS — R350 Frequency of micturition: Secondary | ICD-10-CM | POA: Diagnosis not present

## 2018-04-23 DIAGNOSIS — N390 Urinary tract infection, site not specified: Secondary | ICD-10-CM | POA: Diagnosis not present

## 2018-05-12 DIAGNOSIS — H5213 Myopia, bilateral: Secondary | ICD-10-CM | POA: Diagnosis not present

## 2018-05-21 DIAGNOSIS — Z96659 Presence of unspecified artificial knee joint: Secondary | ICD-10-CM | POA: Diagnosis not present

## 2018-05-21 DIAGNOSIS — M25552 Pain in left hip: Secondary | ICD-10-CM | POA: Diagnosis not present

## 2018-06-01 ENCOUNTER — Ambulatory Visit (INDEPENDENT_AMBULATORY_CARE_PROVIDER_SITE_OTHER): Payer: 59 | Admitting: Family Medicine

## 2018-06-01 VITALS — BP 122/70 | HR 69 | Temp 98.3°F | Ht 67.0 in | Wt 148.6 lb

## 2018-06-01 DIAGNOSIS — R300 Dysuria: Secondary | ICD-10-CM | POA: Diagnosis not present

## 2018-06-01 DIAGNOSIS — N3001 Acute cystitis with hematuria: Secondary | ICD-10-CM

## 2018-06-01 DIAGNOSIS — Z23 Encounter for immunization: Secondary | ICD-10-CM | POA: Diagnosis not present

## 2018-06-01 LAB — POCT URINALYSIS DIPSTICK
BILIRUBIN UA: 1.015
CLARITY UA: NEGATIVE
Color, UA: NEGATIVE
Glucose, UA: NEGATIVE
Protein, UA: NEGATIVE
SPEC GRAV UA: 1.015 (ref 1.010–1.025)
Urobilinogen, UA: 0.2 E.U./dL
pH, UA: 6.5 (ref 5.0–8.0)

## 2018-06-01 MED ORDER — CEPHALEXIN 500 MG PO CAPS: 500.0000 mg | ORAL_CAPSULE | Freq: Two times a day (BID) | ORAL | 0 refills | Status: AC

## 2018-06-01 NOTE — Progress Notes (Signed)
   Subjective:    Patient ID: Rebecca Watkins, female    DOB: 18-Nov-1955, 62 y.o.   MRN: [id number]  HPI  Presents to clinic c/o urinary frequency/urgency/dysuria & blood in urine for 1 day. States she had a UTI 3 to 4 weeks ago, was seen in urgent care and given 3 days worth of Macrobid.  States symptoms cleared up for short time, but then returned.  Denies any fever or chills.  Denies nausea, vomiting or diarrhea.  Patient usually does not get frequent UTIs.  States this is a first issue she has had UTI in a few years.  Patient Active Problem List   Diagnosis Date Noted  . S/P total knee arthroplasty, left 06/13/2017  . Bruising 03/25/2017  . Pre-op evaluation 10/25/2016  . Bilateral knee pain 11/01/2015  . Thrombocytopenia ([place]) 11/01/2015  . AP (abdominal pain)   . Diverticulitis large intestine 10/01/2015   Social History   Tobacco Use  . Smoking status: Never Smoker  . Smokeless tobacco: Never Used  Substance Use Topics  . Alcohol use: No    Alcohol/week: 0.0 standard drinks    Review of Systems   Constitutional: Negative for chills, fatigue and fever.  HENT: Negative for congestion, ear pain, sinus pain and sore throat.   Eyes: Negative.   Respiratory: Negative for cough, shortness of breath and wheezing.   Cardiovascular: Negative for chest pain, palpitations and leg swelling.  Gastrointestinal: Negative for abdominal pain, diarrhea, nausea and vomiting.  Genitourinary: +dysuria, frequency and urgency.  Musculoskeletal: Negative for arthralgias and myalgias.  Skin: Negative for color change, pallor and rash.  Neurological: Negative for syncope, light-headedness and headaches.  Psychiatric/Behavioral: The patient is not nervous/anxious.       Objective:   Physical Exam  Constitutional: She is oriented to person, place, and time. No distress.  HENT:  Head: Normocephalic and atraumatic.  Eyes: No scleral icterus.  Neck: Neck supple. No tracheal deviation present.   Cardiovascular: Normal rate and regular rhythm.  Pulmonary/Chest: Effort normal and breath sounds normal. No respiratory distress.  Abdominal: Soft. Bowel sounds are normal. She exhibits no distension. There is tenderness.  Mild suprapubic tenderness.   Neurological: She is alert and oriented to person, place, and time. No cranial nerve deficit.  Skin: Skin is warm and dry. No pallor.  Psychiatric: She has a normal mood and affect. Her behavior is normal.  Nursing note and vitals reviewed.     Vitals:   06/01/18 1312  BP: 122/70  Pulse: 69  Temp: 98.3 F (36.8 C)  SpO2: 97%    Assessment & Plan:   UTI with hematuria/dysuria - patient took AZO coming to clinic, so UA results can be skewed.  Due to symptoms and patient seen blood in urine at home we will treat with a course of Keflex twice daily for 7 days.  Urine culture collected & sent to lab.  Advised to increase water intake, avoid excess caffeine and sugary drinks, wear cotton underwear, wipe front to back, do good handwashing.  Flu vaccine in clinic  Return to clinic is issues persist or worsen.

## 2018-06-02 LAB — URINE CULTURE
MICRO NUMBER: 91232396
SPECIMEN QUALITY: ADEQUATE

## 2018-06-03 ENCOUNTER — Encounter: Payer: Self-pay | Admitting: *Deleted

## 2018-06-11 ENCOUNTER — Other Ambulatory Visit: Payer: Self-pay | Admitting: Obstetrics and Gynecology

## 2018-06-11 DIAGNOSIS — Z1211 Encounter for screening for malignant neoplasm of colon: Secondary | ICD-10-CM | POA: Diagnosis not present

## 2018-06-11 DIAGNOSIS — Z01419 Encounter for gynecological examination (general) (routine) without abnormal findings: Secondary | ICD-10-CM | POA: Diagnosis not present

## 2018-06-11 DIAGNOSIS — Z1231 Encounter for screening mammogram for malignant neoplasm of breast: Secondary | ICD-10-CM

## 2018-06-11 DIAGNOSIS — Z1239 Encounter for other screening for malignant neoplasm of breast: Secondary | ICD-10-CM | POA: Diagnosis not present

## 2018-07-07 ENCOUNTER — Ambulatory Visit
Admission: RE | Admit: 2018-07-07 | Discharge: 2018-07-07 | Disposition: A | Discharge status: Home / Self Care | Payer: 59 | Source: Ambulatory Visit | Attending: Obstetrics and Gynecology | Admitting: Obstetrics and Gynecology

## 2018-07-07 DIAGNOSIS — Z1231 Encounter for screening mammogram for malignant neoplasm of breast: Secondary | ICD-10-CM | POA: Diagnosis not present

## 2018-10-01 DIAGNOSIS — Z96652 Presence of left artificial knee joint: Secondary | ICD-10-CM | POA: Diagnosis not present

## 2018-10-01 DIAGNOSIS — Z96659 Presence of unspecified artificial knee joint: Secondary | ICD-10-CM | POA: Diagnosis not present

## 2018-10-01 DIAGNOSIS — T8484XA Pain due to internal orthopedic prosthetic devices, implants and grafts, initial encounter: Secondary | ICD-10-CM | POA: Diagnosis not present

## 2018-10-01 DIAGNOSIS — M5416 Radiculopathy, lumbar region: Secondary | ICD-10-CM | POA: Diagnosis not present

## 2018-10-01 DIAGNOSIS — M25562 Pain in left knee: Secondary | ICD-10-CM | POA: Diagnosis not present

## 2018-10-02 IMAGING — US US BREAST*L* LIMITED INC AXILLA
1 series · 10 of 10 positions shown · non-contrast
Comparison: 06/05/2016 and earlier

CLINICAL DATA: Six-month follow-up for probably benign left breast
cysts. Postmenopausal on Prempro.

EXAM:
ULTRASOUND OF THE LEFT BREAST

[Series 1: us breast*left* limited inc axilla · 0.06mm/px · 10 of 10 slices shown]
[im 1/10]
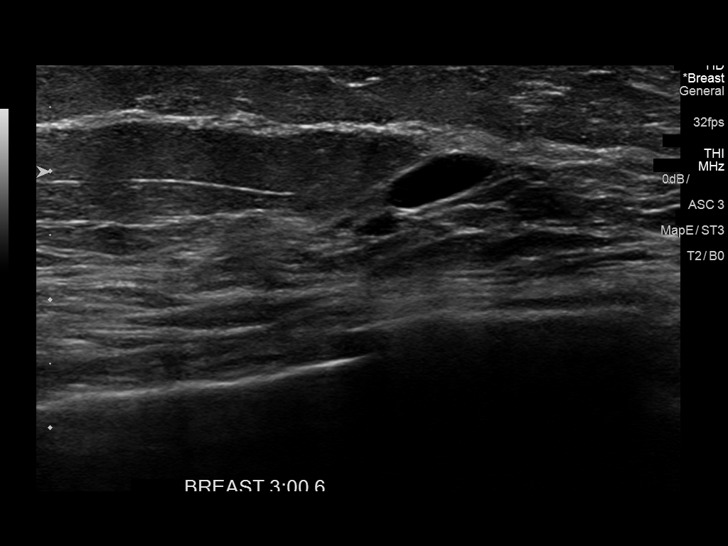
[im 2/10]
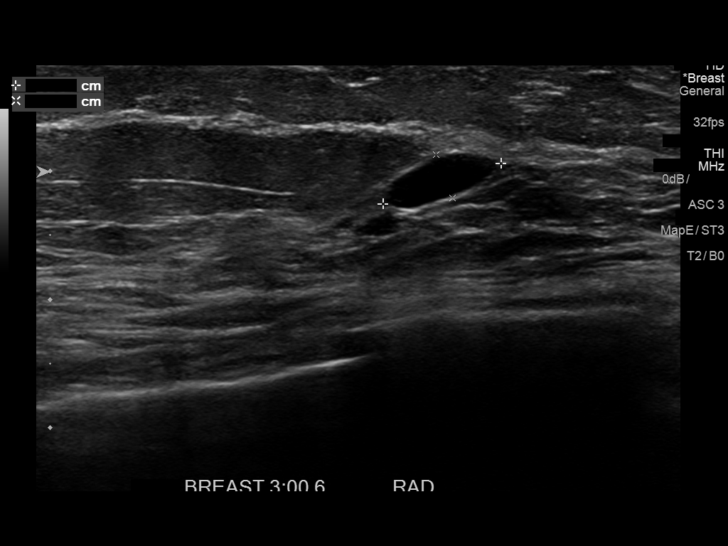
[im 3/10]
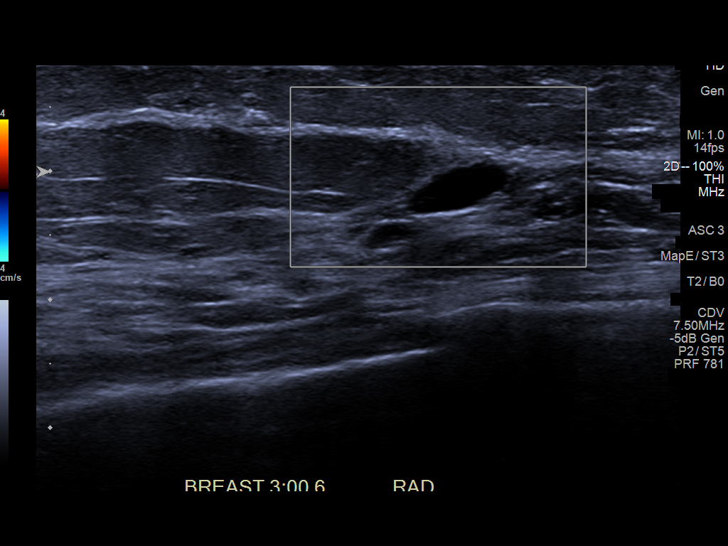
[im 4/10]
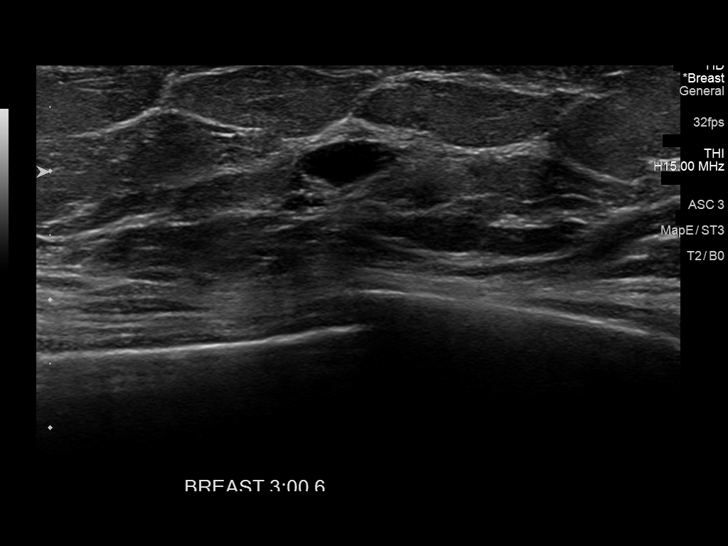
[im 5/10]
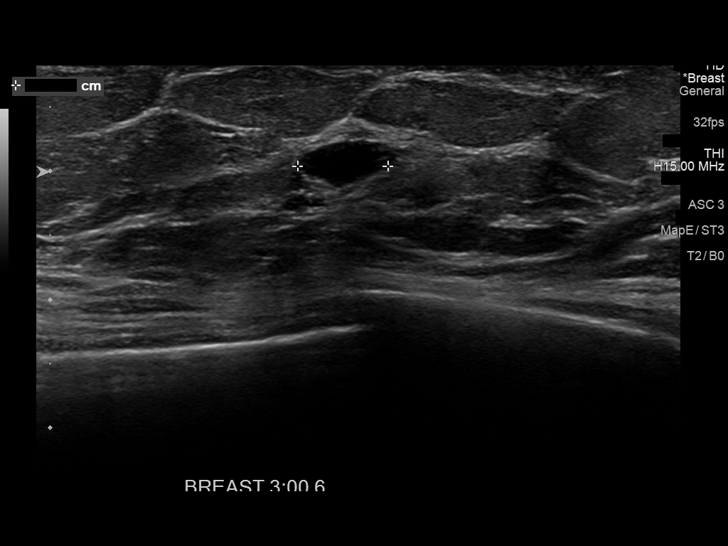
[im 6/10]
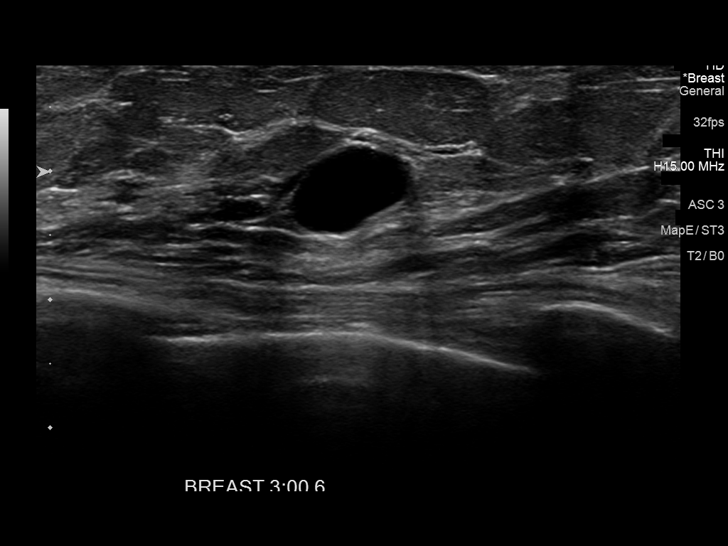
[im 7/10]
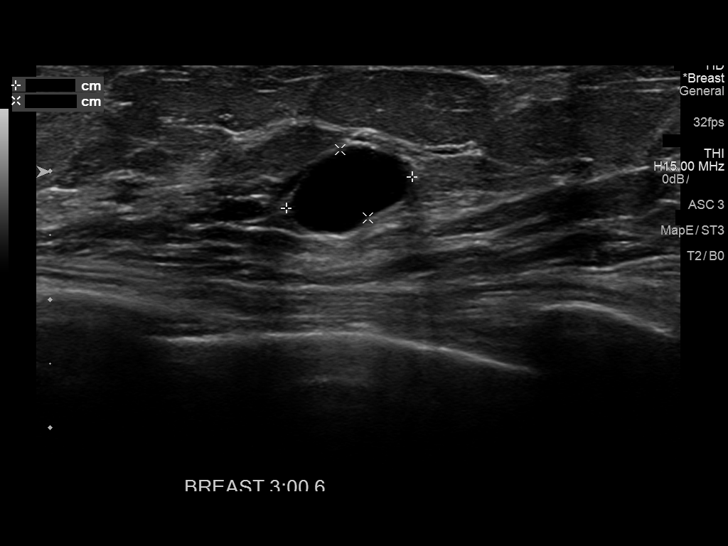
[im 8/10]
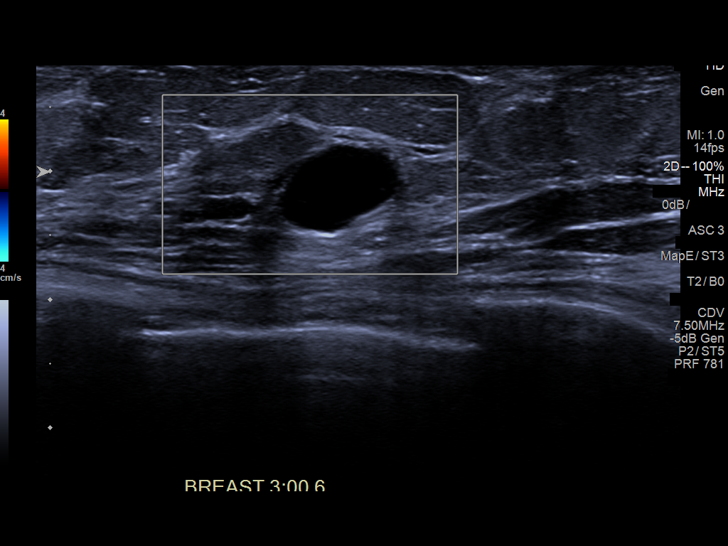
[im 9/10]
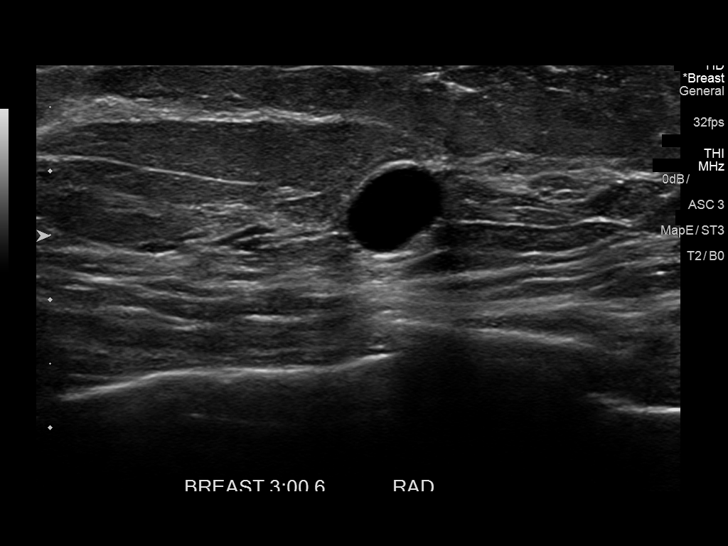
[im 10/10]
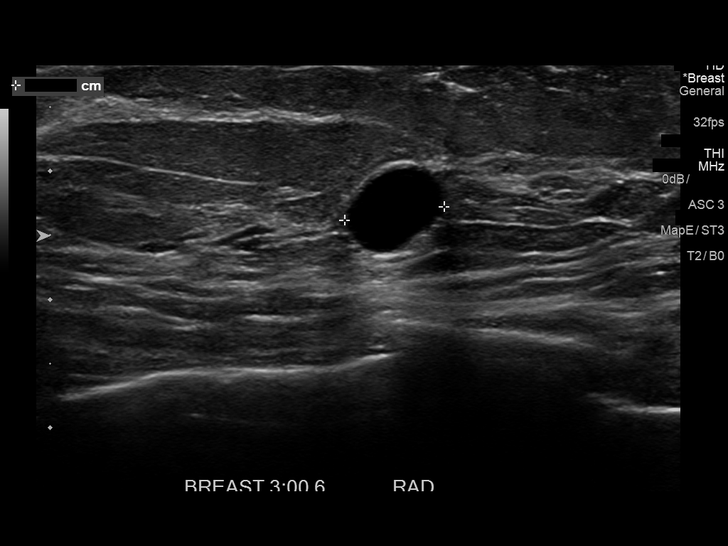

[10 of 10 positions shown; findings below may reference images not displayed]

FINDINGS: Targeted ultrasound is performed, showing a simple cyst in the 3
o'clock location of the left breast 6 cm from nipple which measures
1.0 x 0.6 x 0.8 cm. And adjacent simple cyst is 1.0 x 0.4 x 0.7 cm.
No solid components or areas of acoustic shadowing identified.
IMPRESSION: Adjacent simple cysts in the lateral portion of the left breast. No
suspicious abnormalities identified.

RECOMMENDATION:
Follow-up bilateral diagnostic mammogram and possible left breast
ultrasound suggested in 6 months.

I have discussed the findings and recommendations with the patient.
Results were also provided in writing at the conclusion of the
visit. If applicable, a reminder letter will be sent to the patient
regarding the next appointment.

BI-RADS CATEGORY  2: Benign.

## 2018-11-03 DIAGNOSIS — R200 Anesthesia of skin: Secondary | ICD-10-CM | POA: Diagnosis not present

## 2018-11-12 DIAGNOSIS — Z96659 Presence of unspecified artificial knee joint: Secondary | ICD-10-CM | POA: Diagnosis not present

## 2018-11-12 DIAGNOSIS — T8484XD Pain due to internal orthopedic prosthetic devices, implants and grafts, subsequent encounter: Secondary | ICD-10-CM | POA: Diagnosis not present

## 2018-11-12 DIAGNOSIS — T8484XA Pain due to internal orthopedic prosthetic devices, implants and grafts, initial encounter: Secondary | ICD-10-CM | POA: Diagnosis not present

## 2018-11-16 ENCOUNTER — Other Ambulatory Visit: Payer: Self-pay | Admitting: Orthopedic Surgery

## 2018-11-16 DIAGNOSIS — T8484XA Pain due to internal orthopedic prosthetic devices, implants and grafts, initial encounter: Secondary | ICD-10-CM

## 2018-11-16 DIAGNOSIS — Z96659 Presence of unspecified artificial knee joint: Secondary | ICD-10-CM

## 2018-12-01 DIAGNOSIS — R200 Anesthesia of skin: Secondary | ICD-10-CM | POA: Diagnosis not present

## 2018-12-04 DIAGNOSIS — R200 Anesthesia of skin: Secondary | ICD-10-CM | POA: Diagnosis not present

## 2018-12-04 DIAGNOSIS — E538 Deficiency of other specified B group vitamins: Secondary | ICD-10-CM | POA: Diagnosis not present

## 2018-12-04 DIAGNOSIS — E559 Vitamin D deficiency, unspecified: Secondary | ICD-10-CM | POA: Diagnosis not present

## 2018-12-18 ENCOUNTER — Other Ambulatory Visit: Payer: Self-pay

## 2018-12-18 ENCOUNTER — Ambulatory Visit: Admission: RE | Admit: 2018-12-18 | Payer: 59 | Source: Ambulatory Visit

## 2018-12-18 ENCOUNTER — Ambulatory Visit
Admission: RE | Admit: 2018-12-18 | Discharge: 2018-12-18 | Disposition: A | Discharge status: Home / Self Care | Payer: 59 | Source: Ambulatory Visit | Attending: Orthopedic Surgery | Admitting: Orthopedic Surgery

## 2018-12-18 DIAGNOSIS — M1711 Unilateral primary osteoarthritis, right knee: Secondary | ICD-10-CM | POA: Diagnosis not present

## 2018-12-18 DIAGNOSIS — Z96659 Presence of unspecified artificial knee joint: Secondary | ICD-10-CM | POA: Diagnosis not present

## 2018-12-18 DIAGNOSIS — T8484XA Pain due to internal orthopedic prosthetic devices, implants and grafts, initial encounter: Secondary | ICD-10-CM | POA: Diagnosis not present

## 2018-12-18 MED ORDER — TECHNETIUM TC 99M MEDRONATE IV KIT
20.0000 | PACK | Freq: Once | INTRAVENOUS | Status: AC | PRN
  Administered 2018-12-18: 20.84 via INTRAVENOUS

## 2018-12-23 IMAGING — MG DIGITAL SCREENING BILATERAL MAMMOGRAM WITH TOMO AND CAD
8 series · 8 of 24 positions shown · non-contrast
Comparison: Previous exam(s).

CLINICAL DATA: Screening.

EXAM:
DIGITAL SCREENING BILATERAL MAMMOGRAM WITH TOMO AND CAD

[L CC synth-2D]
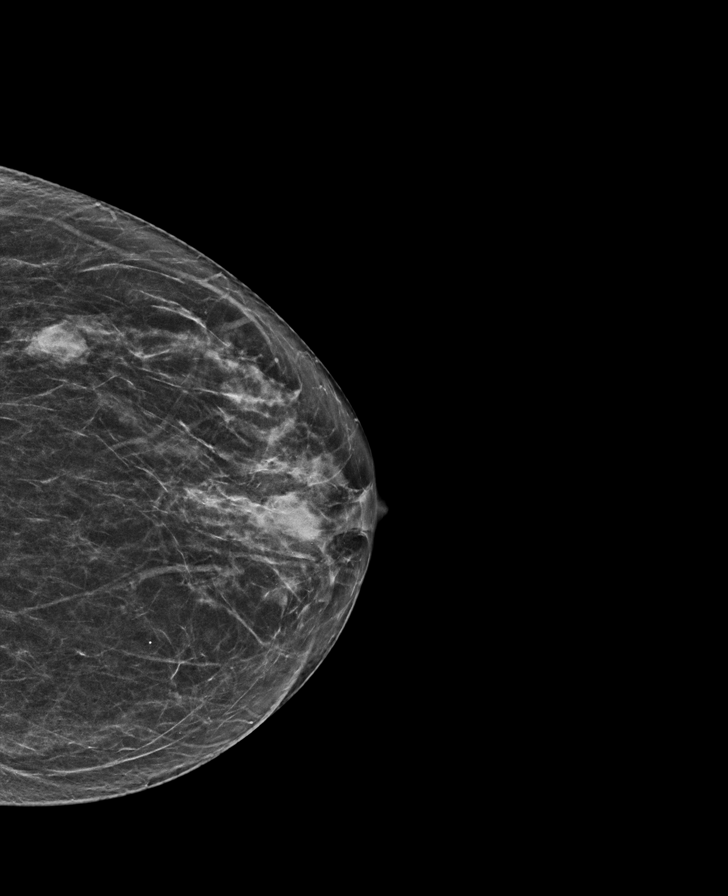

[R CC synth-2D]
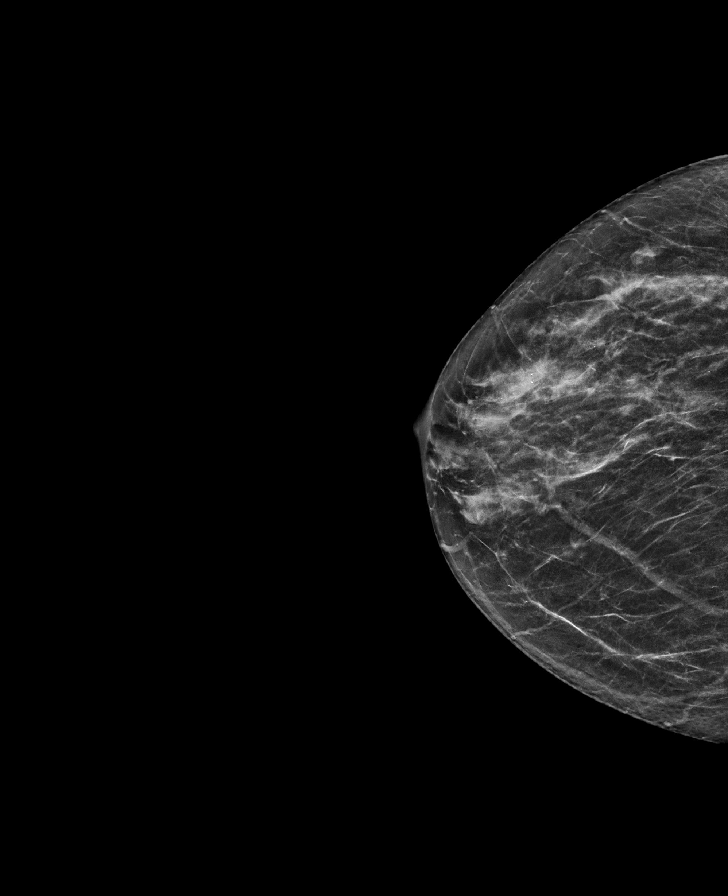

[L MLO synth-2D]
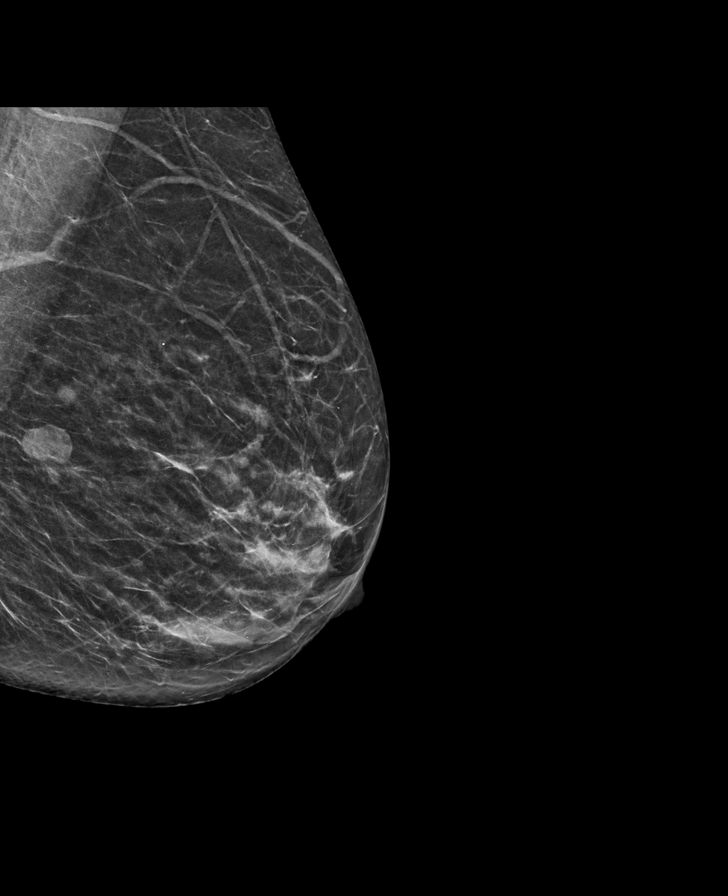

[R MLO synth-2D]
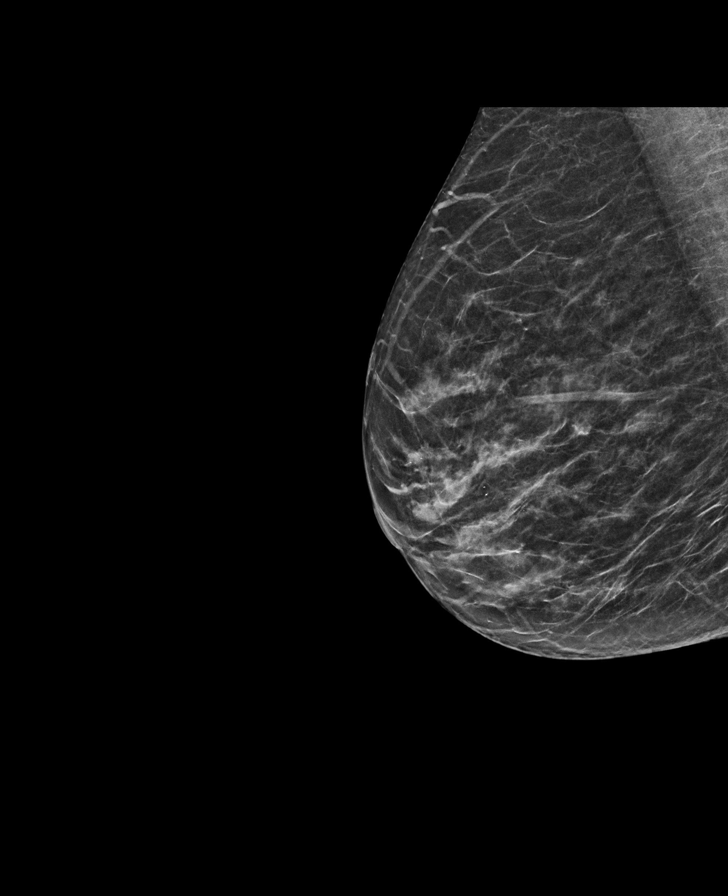

[R CC tomo · tomo slice 27/54.0]
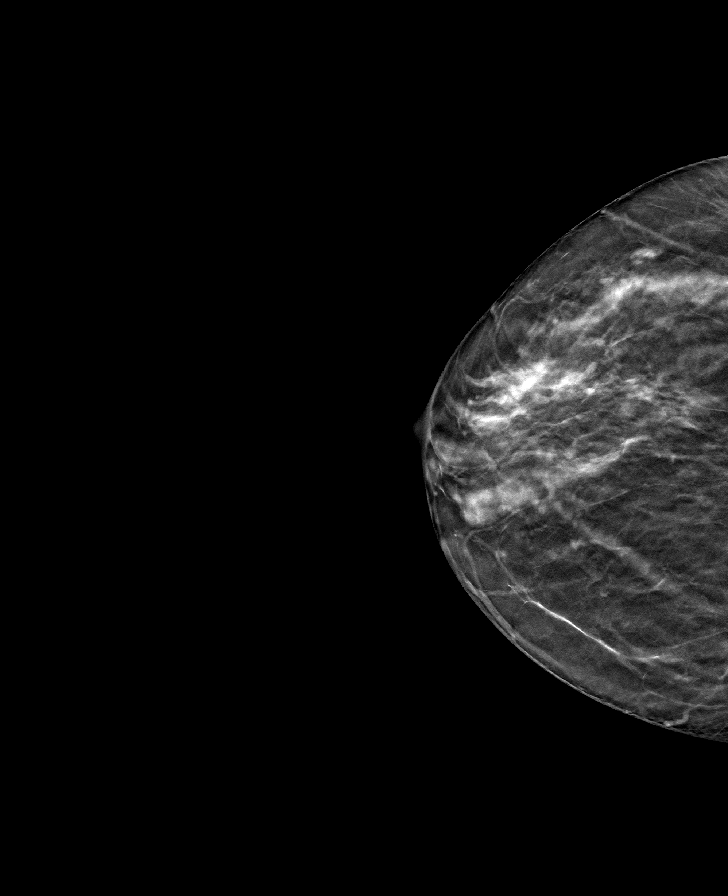

[L CC tomo · tomo slice 31/60.0]
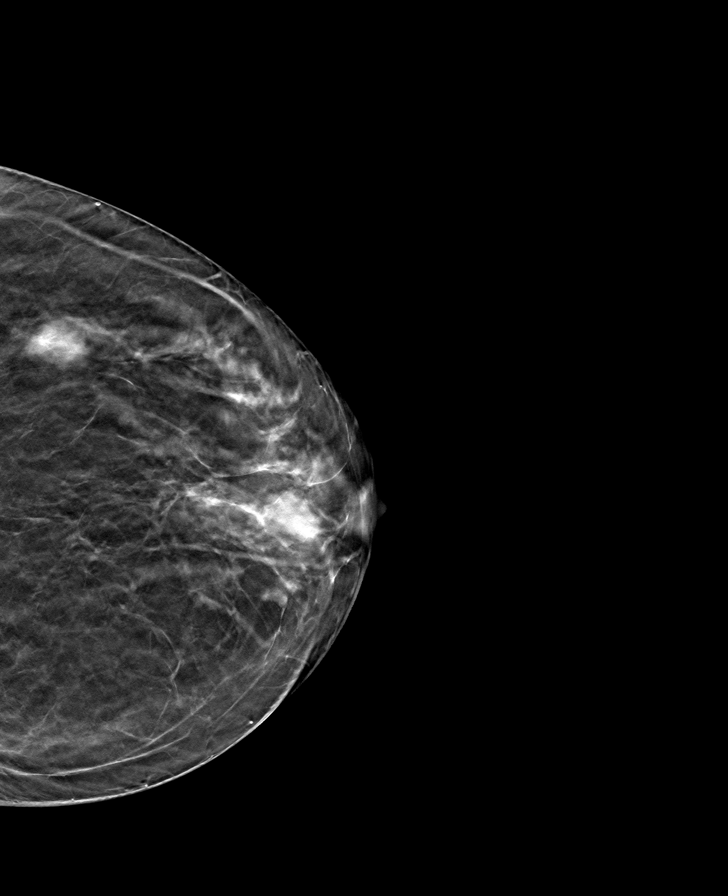

[R MLO tomo · tomo slice 29/57.0]
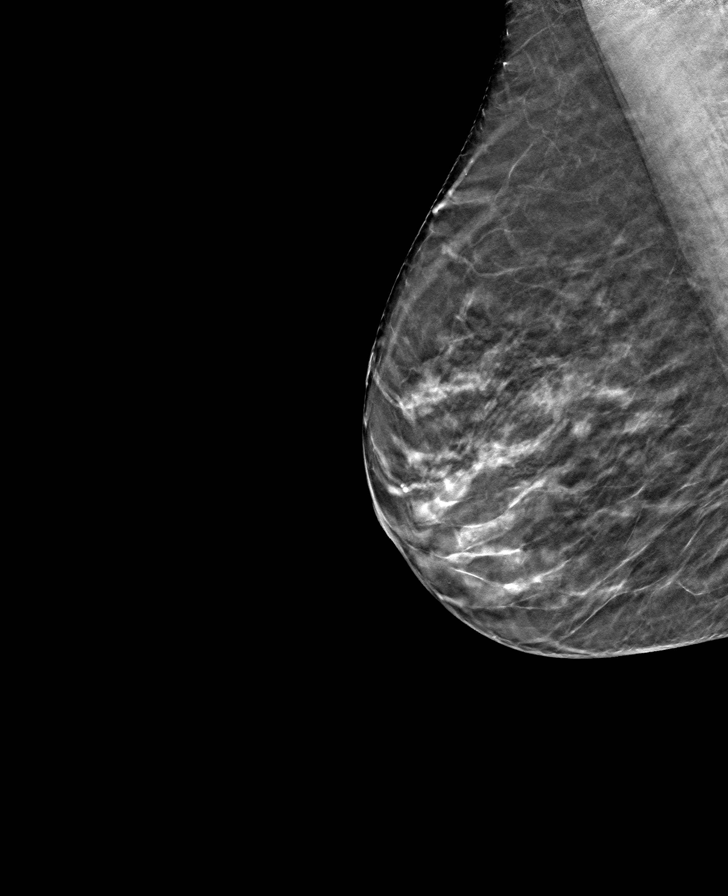

[L MLO tomo · tomo slice 30/59.0]
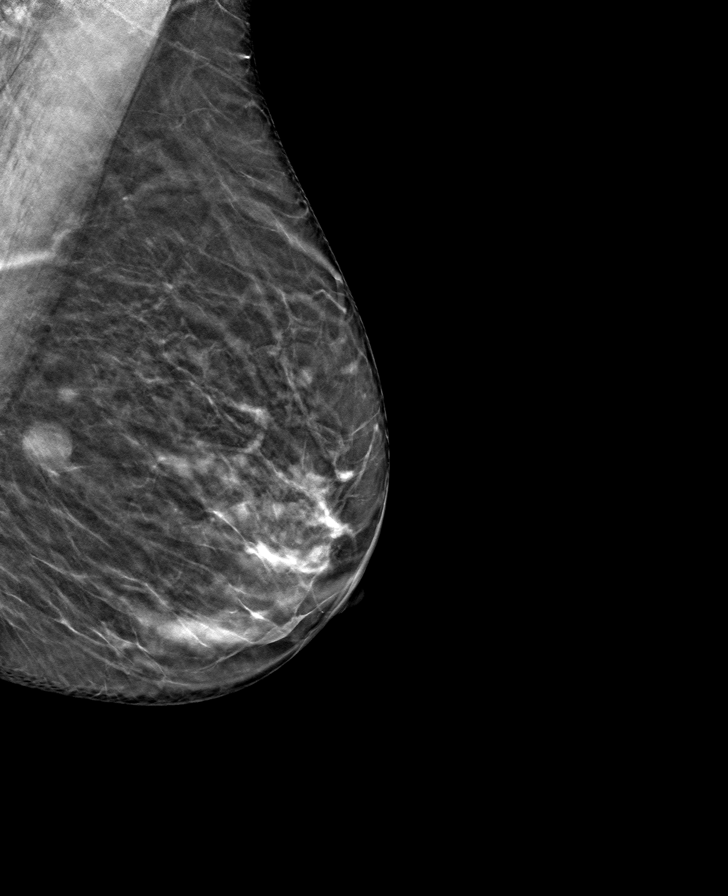

[8 of 24 positions shown; findings below may reference images not displayed]

ACR Breast Density Category b: There are scattered areas of
fibroglandular density.
FINDINGS: There are no findings suspicious for malignancy. Images were
processed with CAD.
IMPRESSION: No mammographic evidence of malignancy. A result letter of this
screening mammogram will be mailed directly to the patient.

RECOMMENDATION:
Screening mammogram in one year. (Code:[id number])

BI-RADS CATEGORY  1: Negative.

## 2018-12-24 DIAGNOSIS — Z96652 Presence of left artificial knee joint: Secondary | ICD-10-CM | POA: Diagnosis not present

## 2018-12-24 DIAGNOSIS — G629 Polyneuropathy, unspecified: Secondary | ICD-10-CM | POA: Diagnosis not present

## 2019-03-01 ENCOUNTER — Telehealth: Payer: Self-pay

## 2019-03-01 NOTE — Telephone Encounter (Signed)
Copied from [place] [id number]. Topic: Appointment Scheduling - Scheduling Inquiry for Clinic >> Mar 01, 2019  4:20 PM Mathis Bud wrote: Reason for CRM: patient called to get shingles vaccines appt  Call back [phone]

## 2019-03-03 NOTE — Telephone Encounter (Signed)
Please confirm whether or not she has had a shingrix vaccine previously. If she has not had a shingrix vaccine previously she can be scheduled for a nurse visit for the shingrix vaccine, though she should be informed that there will be two vaccines that she will need. She will have one at this visit and then she will need one in 2-6 months. There is the potential that she could feel sick for 1-2 days after getting the vaccine. There is also the potential that her insurance may not cover this vaccine and it can cost around $270 per shot depending on coverage.

## 2019-03-03 NOTE — Telephone Encounter (Signed)
Pt would like to get shingles shot. Pt will be out of town and will be back 07/17. Pt needs it by 03/19/2019. Possible schedule by 7/28-7/30. Thank you!

## 2019-03-03 NOTE — Telephone Encounter (Signed)
Pt would like to get shingles shot. Pt will be out of town and will be back 07/17. Pt needs it by 03/19/2019. Possible schedule by 7/28-7/30. Thank you!  Call pt @ [phone].

## 2019-03-05 DIAGNOSIS — R200 Anesthesia of skin: Secondary | ICD-10-CM | POA: Diagnosis not present

## 2019-03-08 DIAGNOSIS — R200 Anesthesia of skin: Secondary | ICD-10-CM | POA: Diagnosis not present

## 2019-03-10 ENCOUNTER — Ambulatory Visit (INDEPENDENT_AMBULATORY_CARE_PROVIDER_SITE_OTHER): Payer: 59

## 2019-03-10 ENCOUNTER — Other Ambulatory Visit: Payer: Self-pay

## 2019-03-10 DIAGNOSIS — Z23 Encounter for immunization: Secondary | ICD-10-CM

## 2019-03-10 NOTE — Telephone Encounter (Signed)
Pt called to schedule nurse appt  I told her what Dr. Caryl Bis said in previous message and she will call her insurance and then call us back  Pt stated she has never had a Shingrix before

## 2019-03-19 DIAGNOSIS — E559 Vitamin D deficiency, unspecified: Secondary | ICD-10-CM | POA: Diagnosis not present

## 2019-03-19 DIAGNOSIS — E538 Deficiency of other specified B group vitamins: Secondary | ICD-10-CM | POA: Diagnosis not present

## 2019-03-19 DIAGNOSIS — Z79899 Other long term (current) drug therapy: Secondary | ICD-10-CM | POA: Diagnosis not present

## 2019-05-07 ENCOUNTER — Telehealth: Payer: Self-pay

## 2019-05-07 NOTE — Telephone Encounter (Signed)
Copied from [place] [phone]. Topic: General - Other >> May 07, 2019  1:50 PM Keene Breath wrote: Reason for CRM: Patient called to request that her 2nd shingrix vaccination [facility].  Please let patient know if this is possible.  CB# [phone]

## 2019-05-10 NOTE — Telephone Encounter (Signed)
Just let her know to call pharmacy we have to send documentation she had the first injection.

## 2019-05-10 NOTE — Telephone Encounter (Signed)
Can we do this or what should I tell the patient about this message.  ni Patient called to request that her 2nd shingrix vaccination [facility].  Please let patient know if this is possible  Thanks Suki Crockett,cma

## 2019-05-10 NOTE — Telephone Encounter (Signed)
Called and left a message for the patient stating that we do not do transfer of vaccines but [facility] can [facility] and [facility] and we can send proof of her 1st one she had here.  Lindon Kiel,cma

## 2019-05-12 ENCOUNTER — Ambulatory Visit: Payer: 59 | Admitting: Family Medicine

## 2019-07-07 ENCOUNTER — Other Ambulatory Visit: Payer: Self-pay | Admitting: Obstetrics and Gynecology

## 2019-07-07 DIAGNOSIS — Z1211 Encounter for screening for malignant neoplasm of colon: Secondary | ICD-10-CM | POA: Diagnosis not present

## 2019-07-07 DIAGNOSIS — Z01419 Encounter for gynecological examination (general) (routine) without abnormal findings: Secondary | ICD-10-CM | POA: Diagnosis not present

## 2019-07-07 DIAGNOSIS — Z1231 Encounter for screening mammogram for malignant neoplasm of breast: Secondary | ICD-10-CM

## 2019-07-07 DIAGNOSIS — Z124 Encounter for screening for malignant neoplasm of cervix: Secondary | ICD-10-CM | POA: Diagnosis not present

## 2019-08-02 DIAGNOSIS — N762 Acute vulvitis: Secondary | ICD-10-CM | POA: Diagnosis not present

## 2019-08-02 DIAGNOSIS — N898 Other specified noninflammatory disorders of vagina: Secondary | ICD-10-CM | POA: Diagnosis not present

## 2019-08-18 ENCOUNTER — Ambulatory Visit
Admission: RE | Admit: 2019-08-18 | Discharge: 2019-08-18 | Disposition: A | Discharge status: Home / Self Care | Payer: PPO | Source: Ambulatory Visit | Attending: Obstetrics and Gynecology | Admitting: Obstetrics and Gynecology

## 2019-08-18 DIAGNOSIS — Z1231 Encounter for screening mammogram for malignant neoplasm of breast: Secondary | ICD-10-CM | POA: Diagnosis not present

## 2019-10-28 DIAGNOSIS — L578 Other skin changes due to chronic exposure to nonionizing radiation: Secondary | ICD-10-CM | POA: Diagnosis not present

## 2019-10-28 DIAGNOSIS — Z85828 Personal history of other malignant neoplasm of skin: Secondary | ICD-10-CM | POA: Diagnosis not present

## 2019-10-28 DIAGNOSIS — L820 Inflamed seborrheic keratosis: Secondary | ICD-10-CM | POA: Diagnosis not present

## 2019-10-28 DIAGNOSIS — D1801 Hemangioma of skin and subcutaneous tissue: Secondary | ICD-10-CM | POA: Diagnosis not present

## 2019-10-28 DIAGNOSIS — L570 Actinic keratosis: Secondary | ICD-10-CM | POA: Diagnosis not present

## 2019-10-28 DIAGNOSIS — L821 Other seborrheic keratosis: Secondary | ICD-10-CM | POA: Diagnosis not present

## 2019-12-09 ENCOUNTER — Other Ambulatory Visit: Payer: Self-pay | Admitting: Neurology

## 2020-02-23 DIAGNOSIS — Z1159 Encounter for screening for other viral diseases: Secondary | ICD-10-CM | POA: Diagnosis not present

## 2020-02-23 DIAGNOSIS — Z114 Encounter for screening for human immunodeficiency virus [HIV]: Secondary | ICD-10-CM | POA: Diagnosis not present

## 2020-02-23 DIAGNOSIS — E538 Deficiency of other specified B group vitamins: Secondary | ICD-10-CM | POA: Diagnosis not present

## 2020-02-23 DIAGNOSIS — Z0000 Encounter for general adult medical examination without abnormal findings: Secondary | ICD-10-CM | POA: Diagnosis not present

## 2020-02-23 DIAGNOSIS — Z23 Encounter for immunization: Secondary | ICD-10-CM | POA: Diagnosis not present

## 2020-03-09 ENCOUNTER — Encounter: Payer: PPO | Admitting: Dermatology

## 2020-03-27 DIAGNOSIS — Z09 Encounter for follow-up examination after completed treatment for conditions other than malignant neoplasm: Secondary | ICD-10-CM | POA: Diagnosis not present

## 2020-05-04 ENCOUNTER — Telehealth: Payer: Self-pay | Admitting: Family Medicine

## 2020-05-04 NOTE — Telephone Encounter (Signed)
-----   Message from Salvatore Marvel sent at 05/03/2020  2:54 PM EDT ----- Regarding: remove pcp Pt is seeing Dr. Tressia Miners at [facility] now. Please remove Dr. Caryl Bis   Thank you

## 2020-05-04 NOTE — Telephone Encounter (Signed)
PCP removed. 

## 2020-07-11 ENCOUNTER — Other Ambulatory Visit: Payer: Self-pay | Admitting: Obstetrics and Gynecology

## 2020-07-11 DIAGNOSIS — Z01419 Encounter for gynecological examination (general) (routine) without abnormal findings: Secondary | ICD-10-CM | POA: Diagnosis not present

## 2020-07-11 DIAGNOSIS — Z1331 Encounter for screening for depression: Secondary | ICD-10-CM | POA: Diagnosis not present

## 2020-07-11 DIAGNOSIS — Z124 Encounter for screening for malignant neoplasm of cervix: Secondary | ICD-10-CM | POA: Diagnosis not present

## 2020-07-11 DIAGNOSIS — Z803 Family history of malignant neoplasm of breast: Secondary | ICD-10-CM | POA: Diagnosis not present

## 2020-07-20 ENCOUNTER — Other Ambulatory Visit: Payer: Self-pay | Admitting: Obstetrics and Gynecology

## 2020-07-20 DIAGNOSIS — Z13820 Encounter for screening for osteoporosis: Secondary | ICD-10-CM

## 2020-07-20 DIAGNOSIS — Z1231 Encounter for screening mammogram for malignant neoplasm of breast: Secondary | ICD-10-CM

## 2020-08-03 ENCOUNTER — Other Ambulatory Visit: Payer: Self-pay | Admitting: Neurology

## 2020-08-03 DIAGNOSIS — Z13820 Encounter for screening for osteoporosis: Secondary | ICD-10-CM | POA: Diagnosis not present

## 2020-08-03 DIAGNOSIS — M8588 Other specified disorders of bone density and structure, other site: Secondary | ICD-10-CM | POA: Diagnosis not present

## 2020-08-08 ENCOUNTER — Other Ambulatory Visit: Payer: Self-pay | Admitting: Neurology

## 2020-08-08 DIAGNOSIS — R200 Anesthesia of skin: Secondary | ICD-10-CM | POA: Diagnosis not present

## 2020-08-08 DIAGNOSIS — G629 Polyneuropathy, unspecified: Secondary | ICD-10-CM | POA: Diagnosis not present

## 2020-08-24 ENCOUNTER — Other Ambulatory Visit: Payer: Self-pay

## 2020-08-24 ENCOUNTER — Ambulatory Visit
Admission: RE | Admit: 2020-08-24 | Discharge: 2020-08-24 | Disposition: A | Discharge status: Home / Self Care | Payer: PPO | Source: Ambulatory Visit | Attending: Obstetrics and Gynecology | Admitting: Obstetrics and Gynecology

## 2020-08-24 DIAGNOSIS — Z1231 Encounter for screening mammogram for malignant neoplasm of breast: Secondary | ICD-10-CM | POA: Insufficient documentation

## 2020-11-20 MED FILL — Conjugated Estrogen-Medroxyprogest Acetate Tab 0.45-1.5 MG: ORAL | 28 days supply | Qty: 28 | Fill #0 | Status: AC

## 2020-11-21 ENCOUNTER — Other Ambulatory Visit: Payer: Self-pay

## 2020-12-13 ENCOUNTER — Other Ambulatory Visit: Payer: Self-pay

## 2020-12-13 DIAGNOSIS — G2581 Restless legs syndrome: Secondary | ICD-10-CM | POA: Diagnosis not present

## 2020-12-13 DIAGNOSIS — G629 Polyneuropathy, unspecified: Secondary | ICD-10-CM | POA: Diagnosis not present

## 2020-12-13 MED ORDER — PREGABALIN 25 MG PO CAPS
ORAL_CAPSULE | ORAL | 1 refills | Status: DC
  Filled 2020-12-13: qty 90, 30d supply, fill #0

## 2020-12-19 MED FILL — Conjugated Estrogen-Medroxyprogest Acetate Tab 0.45-1.5 MG: ORAL | 28 days supply | Qty: 28 | Fill #1 | Status: AC

## 2020-12-20 ENCOUNTER — Other Ambulatory Visit: Payer: Self-pay

## 2021-01-25 MED FILL — Conjugated Estrogen-Medroxyprogest Acetate Tab 0.45-1.5 MG: ORAL | 28 days supply | Qty: 28 | Fill #2 | Status: AC

## 2021-01-26 ENCOUNTER — Other Ambulatory Visit: Payer: Self-pay

## 2021-02-19 MED FILL — Gabapentin Cap 300 MG: ORAL | 90 days supply | Qty: 90 | Fill #0 | Status: AC

## 2021-02-20 ENCOUNTER — Other Ambulatory Visit: Payer: Self-pay

## 2021-02-20 DIAGNOSIS — Z803 Family history of malignant neoplasm of breast: Secondary | ICD-10-CM | POA: Diagnosis not present

## 2021-02-22 MED FILL — Conjugated Estrogen-Medroxyprogest Acetate Tab 0.45-1.5 MG: ORAL | 28 days supply | Qty: 28 | Fill #3 | Status: AC

## 2021-02-23 ENCOUNTER — Other Ambulatory Visit: Payer: Self-pay

## 2021-02-27 ENCOUNTER — Other Ambulatory Visit: Payer: Self-pay

## 2021-02-27 MED FILL — Clobetasol Propionate Cream 0.05%: CUTANEOUS | 20 days supply | Qty: 30 | Fill #0 | Status: AC

## 2021-03-14 DIAGNOSIS — G629 Polyneuropathy, unspecified: Secondary | ICD-10-CM | POA: Diagnosis not present

## 2021-03-14 DIAGNOSIS — G2581 Restless legs syndrome: Secondary | ICD-10-CM | POA: Diagnosis not present

## 2021-03-26 ENCOUNTER — Other Ambulatory Visit: Payer: Self-pay

## 2021-03-26 MED FILL — Conjugated Estrogen-Medroxyprogest Acetate Tab 0.45-1.5 MG: ORAL | 28 days supply | Qty: 28 | Fill #4 | Status: AC

## 2021-04-24 ENCOUNTER — Other Ambulatory Visit: Payer: Self-pay

## 2021-04-24 MED ORDER — CLOBETASOL PROPIONATE 0.05 % EX CREA
TOPICAL_CREAM | CUTANEOUS | 1 refills | Status: DC
  Filled 2021-04-24: qty 30, 20d supply, fill #0

## 2021-04-24 MED FILL — Conjugated Estrogen-Medroxyprogest Acetate Tab 0.45-1.5 MG: ORAL | 28 days supply | Qty: 28 | Fill #5 | Status: AC

## 2021-05-16 ENCOUNTER — Other Ambulatory Visit: Payer: Self-pay

## 2021-05-16 MED FILL — Gabapentin Cap 300 MG: ORAL | 90 days supply | Qty: 90 | Fill #1 | Status: AC

## 2021-05-23 ENCOUNTER — Other Ambulatory Visit: Payer: Self-pay

## 2021-05-23 MED FILL — Conjugated Estrogen-Medroxyprogest Acetate Tab 0.45-1.5 MG: ORAL | 28 days supply | Qty: 28 | Fill #6 | Status: AC

## 2021-06-26 ENCOUNTER — Other Ambulatory Visit: Payer: Self-pay

## 2021-06-26 MED FILL — Conjugated Estrogen-Medroxyprogest Acetate Tab 0.45-1.5 MG: ORAL | 28 days supply | Qty: 28 | Fill #7 | Status: AC

## 2021-07-18 ENCOUNTER — Other Ambulatory Visit: Payer: Self-pay

## 2021-07-18 ENCOUNTER — Other Ambulatory Visit: Payer: Self-pay | Admitting: Obstetrics and Gynecology

## 2021-07-18 DIAGNOSIS — Z1211 Encounter for screening for malignant neoplasm of colon: Secondary | ICD-10-CM | POA: Diagnosis not present

## 2021-07-18 DIAGNOSIS — Z1231 Encounter for screening mammogram for malignant neoplasm of breast: Secondary | ICD-10-CM

## 2021-07-18 DIAGNOSIS — N63 Unspecified lump in unspecified breast: Secondary | ICD-10-CM | POA: Diagnosis not present

## 2021-07-18 DIAGNOSIS — Z803 Family history of malignant neoplasm of breast: Secondary | ICD-10-CM | POA: Diagnosis not present

## 2021-07-18 DIAGNOSIS — Z124 Encounter for screening for malignant neoplasm of cervix: Secondary | ICD-10-CM | POA: Diagnosis not present

## 2021-07-18 MED ORDER — PREMPRO 0.45-1.5 MG PO TABS
1.0000 | ORAL_TABLET | Freq: Every day | ORAL | 11 refills | Status: DC
  Filled 2021-07-18: qty 28, 28d supply, fill #0
  Filled 2021-08-27 – 2021-09-17 (×3): qty 28, 28d supply, fill #1
  Filled 2021-11-06: qty 28, 28d supply, fill #2
  Filled 2021-12-31: qty 28, 28d supply, fill #3
  Filled 2022-03-04: qty 28, 28d supply, fill #4
  Filled 2022-04-11: qty 28, 28d supply, fill #5
  Filled 2022-05-15: qty 28, 28d supply, fill #6
  Filled 2022-07-15: qty 28, 28d supply, fill #7

## 2021-07-26 DIAGNOSIS — Z780 Asymptomatic menopausal state: Secondary | ICD-10-CM | POA: Diagnosis not present

## 2021-07-26 DIAGNOSIS — Z23 Encounter for immunization: Secondary | ICD-10-CM | POA: Diagnosis not present

## 2021-07-26 DIAGNOSIS — Z0000 Encounter for general adult medical examination without abnormal findings: Secondary | ICD-10-CM | POA: Diagnosis not present

## 2021-07-26 DIAGNOSIS — Z79899 Other long term (current) drug therapy: Secondary | ICD-10-CM | POA: Diagnosis not present

## 2021-07-26 DIAGNOSIS — D696 Thrombocytopenia, unspecified: Secondary | ICD-10-CM | POA: Diagnosis not present

## 2021-07-26 DIAGNOSIS — G629 Polyneuropathy, unspecified: Secondary | ICD-10-CM | POA: Diagnosis not present

## 2021-08-01 ENCOUNTER — Other Ambulatory Visit: Payer: Self-pay

## 2021-08-01 MED FILL — Gabapentin Cap 300 MG: ORAL | 90 days supply | Qty: 90 | Fill #2 | Status: AC

## 2021-08-27 ENCOUNTER — Other Ambulatory Visit: Payer: Self-pay

## 2021-08-28 ENCOUNTER — Other Ambulatory Visit: Payer: Self-pay

## 2021-08-28 ENCOUNTER — Ambulatory Visit
Admission: RE | Admit: 2021-08-28 | Discharge: 2021-08-28 | Disposition: A | Discharge status: Home / Self Care | Payer: PPO | Source: Ambulatory Visit | Attending: Obstetrics and Gynecology | Admitting: Obstetrics and Gynecology

## 2021-08-28 DIAGNOSIS — Z1231 Encounter for screening mammogram for malignant neoplasm of breast: Secondary | ICD-10-CM | POA: Insufficient documentation

## 2021-08-29 ENCOUNTER — Other Ambulatory Visit: Payer: Self-pay

## 2021-08-31 ENCOUNTER — Other Ambulatory Visit: Payer: Self-pay | Admitting: Obstetrics and Gynecology

## 2021-08-31 DIAGNOSIS — R928 Other abnormal and inconclusive findings on diagnostic imaging of breast: Secondary | ICD-10-CM

## 2021-08-31 DIAGNOSIS — N6320 Unspecified lump in the left breast, unspecified quadrant: Secondary | ICD-10-CM

## 2021-09-03 ENCOUNTER — Other Ambulatory Visit: Payer: Self-pay

## 2021-09-05 ENCOUNTER — Other Ambulatory Visit: Payer: Self-pay

## 2021-09-05 ENCOUNTER — Ambulatory Visit
Admission: RE | Admit: 2021-09-05 | Discharge: 2021-09-05 | Disposition: A | Discharge status: Home / Self Care | Payer: PPO | Source: Ambulatory Visit | Attending: Obstetrics and Gynecology | Admitting: Obstetrics and Gynecology

## 2021-09-05 DIAGNOSIS — R928 Other abnormal and inconclusive findings on diagnostic imaging of breast: Secondary | ICD-10-CM | POA: Insufficient documentation

## 2021-09-05 DIAGNOSIS — Z803 Family history of malignant neoplasm of breast: Secondary | ICD-10-CM | POA: Diagnosis not present

## 2021-09-05 DIAGNOSIS — N6320 Unspecified lump in the left breast, unspecified quadrant: Secondary | ICD-10-CM | POA: Insufficient documentation

## 2021-09-11 ENCOUNTER — Other Ambulatory Visit: Payer: Self-pay | Admitting: Obstetrics and Gynecology

## 2021-09-11 DIAGNOSIS — N6320 Unspecified lump in the left breast, unspecified quadrant: Secondary | ICD-10-CM

## 2021-09-11 DIAGNOSIS — R928 Other abnormal and inconclusive findings on diagnostic imaging of breast: Secondary | ICD-10-CM

## 2021-09-13 ENCOUNTER — Other Ambulatory Visit: Payer: Self-pay

## 2021-09-17 ENCOUNTER — Other Ambulatory Visit: Payer: Self-pay

## 2021-09-27 ENCOUNTER — Ambulatory Visit
Admission: RE | Admit: 2021-09-27 | Discharge: 2021-09-27 | Disposition: A | Discharge status: Home / Self Care | Payer: PPO | Source: Ambulatory Visit | Attending: Obstetrics and Gynecology | Admitting: Obstetrics and Gynecology

## 2021-09-27 ENCOUNTER — Other Ambulatory Visit: Payer: Self-pay

## 2021-09-27 DIAGNOSIS — N6320 Unspecified lump in the left breast, unspecified quadrant: Secondary | ICD-10-CM

## 2021-09-27 DIAGNOSIS — N6002 Solitary cyst of left breast: Secondary | ICD-10-CM | POA: Diagnosis not present

## 2021-09-27 DIAGNOSIS — R928 Other abnormal and inconclusive findings on diagnostic imaging of breast: Secondary | ICD-10-CM

## 2021-09-27 DIAGNOSIS — R922 Inconclusive mammogram: Secondary | ICD-10-CM | POA: Diagnosis not present

## 2021-10-15 DIAGNOSIS — G629 Polyneuropathy, unspecified: Secondary | ICD-10-CM | POA: Diagnosis not present

## 2021-10-15 DIAGNOSIS — R2689 Other abnormalities of gait and mobility: Secondary | ICD-10-CM | POA: Diagnosis not present

## 2021-10-15 DIAGNOSIS — L659 Nonscarring hair loss, unspecified: Secondary | ICD-10-CM | POA: Diagnosis not present

## 2021-10-15 DIAGNOSIS — G2581 Restless legs syndrome: Secondary | ICD-10-CM | POA: Diagnosis not present

## 2021-10-18 ENCOUNTER — Other Ambulatory Visit: Payer: Self-pay

## 2021-10-18 ENCOUNTER — Ambulatory Visit: Payer: PPO | Admitting: Dermatology

## 2021-10-18 DIAGNOSIS — L578 Other skin changes due to chronic exposure to nonionizing radiation: Secondary | ICD-10-CM

## 2021-10-18 DIAGNOSIS — Z85828 Personal history of other malignant neoplasm of skin: Secondary | ICD-10-CM | POA: Diagnosis not present

## 2021-10-18 DIAGNOSIS — L814 Other melanin hyperpigmentation: Secondary | ICD-10-CM | POA: Diagnosis not present

## 2021-10-18 DIAGNOSIS — D1800 Hemangioma unspecified site: Secondary | ICD-10-CM | POA: Diagnosis not present

## 2021-10-18 DIAGNOSIS — L821 Other seborrheic keratosis: Secondary | ICD-10-CM

## 2021-10-18 DIAGNOSIS — D229 Melanocytic nevi, unspecified: Secondary | ICD-10-CM | POA: Diagnosis not present

## 2021-10-18 DIAGNOSIS — L820 Inflamed seborrheic keratosis: Secondary | ICD-10-CM

## 2021-10-18 DIAGNOSIS — D692 Other nonthrombocytopenic purpura: Secondary | ICD-10-CM

## 2021-10-18 DIAGNOSIS — Z1283 Encounter for screening for malignant neoplasm of skin: Secondary | ICD-10-CM

## 2021-10-18 NOTE — Patient Instructions (Addendum)
Seborrheic Keratosis  What causes seborrheic keratoses? Seborrheic keratoses are harmless, common skin growths that first appear during adult life.  As time goes by, more growths appear.  Some people may develop a large number of them.  Seborrheic keratoses appear on both covered and uncovered body parts.  They are not caused by sunlight.  The tendency to develop seborrheic keratoses can be inherited.  They vary in color from skin-colored to gray, brown, or even black.  They can be either smooth or have a rough, warty surface.   Seborrheic keratoses are superficial and look as if they were stuck on the skin.  Under the microscope this type of keratosis looks like layers upon layers of skin.  That is why at times the top layer may seem to fall off, but the rest of the growth remains and re-grows.    Treatment Seborrheic keratoses do not need to be treated, but can easily be removed in the office.  Seborrheic keratoses often cause symptoms when they rub on clothing or jewelry.  Lesions can be in the way of shaving.  If they become inflamed, they can cause itching, soreness, or burning.  Removal of a seborrheic keratosis can be accomplished by freezing, burning, or surgery. If any spot bleeds, scabs, or grows rapidly, please return to have it checked, as these can be an indication of a skin cancer.  Cryotherapy Aftercare  Wash gently with soap and water everyday.   Apply Vaseline and Band-Aid daily until healed.     Melanoma ABCDEs  Melanoma is the most dangerous type of skin cancer, and is the leading cause of death from skin disease.  You are more likely to develop melanoma if you: Have light-colored skin, light-colored eyes, or red or blond hair Spend a lot of time in the sun Tan regularly, either outdoors or in a tanning bed Have had blistering sunburns, especially during childhood Have a close family member who has had a melanoma Have atypical moles or large birthmarks  Early detection  of melanoma is key since treatment is typically straightforward and cure rates are extremely high if we catch it early.   The first sign of melanoma is often a change in a mole or a new dark spot.  The ABCDE system is a way of remembering the signs of melanoma.  A for asymmetry:  The two halves do not match. B for border:  The edges of the growth are irregular. C for color:  A mixture of colors are present instead of an even brown color. D for diameter:  Melanomas are usually (but not always) greater than 72mm - the size of a pencil eraser. E for evolution:  The spot keeps changing in size, shape, and color.  Please check your skin once per month between visits. You can use a small mirror in front and a large mirror behind you to keep an eye on the back side or your body.   If you see any new or changing lesions before your next follow-up, please call to schedule a visit.  Please continue daily skin protection including broad spectrum sunscreen SPF 30+ to sun-exposed areas, reapplying every 2 hours as needed when you're outdoors.   Staying in the shade or wearing long sleeves, sun glasses (UVA+UVB protection) and wide brim hats (4-inch brim around the entire circumference of the hat) are also recommended for sun protection.    If You Need Anything After Your Visit  If you have any questions or concerns  for your doctor, please call our main line at [phone] and press option 4 to reach your doctor's medical assistant. If no one answers, please leave a voicemail as directed and we will return your call as soon as possible. Messages left after 4 pm will be answered the following business day.   You may also send [place] a message via [place]. We typically respond to MyChart messages within 1-2 business days.  For prescription refills, please ask your pharmacy to contact our office. Our fax number is [phone].  If you have an urgent issue when the clinic is closed that cannot wait until the  next business day, you can page your doctor at the number below.    Please note that while we do our best to be available for urgent issues outside of office hours, we are not available 24/7.   If you have an urgent issue and are unable to reach [place], you may choose to seek medical care at your doctor's office, retail clinic, urgent care center, or emergency room.  If you have a medical emergency, please immediately call 911 or go to the emergency department.  Pager Numbers  - Dr. Nehemiah Massed: [phone]  - Dr. Laurence Ferrari: [phone]  - Dr. Nicole Kindred: [phone]  In the event of inclement weather, please call our main line at [phone] for an update on the status of any delays or closures.  Dermatology Medication Tips: Please keep the boxes that topical medications come in in order to help keep track of the instructions about where and how to use these. Pharmacies typically print the medication instructions only on the boxes and not directly on the medication tubes.   If your medication is too expensive, please contact our office at [phone] option 4 or send [place] a message through [place].   We are unable to tell what your co-pay for medications will be in advance as this is different depending on your insurance coverage. However, we may be able to find a substitute medication at lower cost or fill out paperwork to get insurance to cover a needed medication.   If a prior authorization is required to get your medication covered by your insurance company, please allow [place] 1-2 business days to complete this process.  Drug prices often vary depending on where the prescription is filled and some pharmacies may offer cheaper prices.  The website www.goodrx.com contains coupons for medications through different pharmacies. The prices here do not account for what the cost may be with help from insurance (it may be cheaper with your insurance), but the website can give you the price if you did not  use any insurance.  - You can print the associated coupon and take it with your prescription to the pharmacy.  - You may also stop by our office during regular business hours and pick up a GoodRx coupon card.  - If you need your prescription sent electronically to a different pharmacy, notify our office through [facility] or by phone at [phone] option 4.     Si Usted Necesita Algo Despus de Su Visita  Tambin puede enviarnos un mensaje a travs de Pharmacist, community. Por lo general respondemos a los mensajes de MyChart en el transcurso de 1 a 2 das hbiles.  Para renovar recetas, por favor pida a su farmacia que se ponga en contacto con nuestra oficina. Harland Dingwall de fax es [place] [phone].  Si tiene un asunto urgente cuando la clnica est cerrada y que no puede esperar Nurse, children's  el siguiente da hbil, puede llamar/localizar a su doctor(a) al nmero que aparece a continuacin.   Por favor, tenga en cuenta que aunque hacemos todo lo posible para estar disponibles para asuntos urgentes fuera del horario de [place], no estamos disponibles las 24 horas del da, los 7 das de la [place].   Si tiene un problema urgente y no puede comunicarse con nosotros, puede optar por buscar atencin mdica  en el consultorio de su doctor(a), en una clnica privada, en un centro de atencin urgente o en una sala de emergencias.  Si tiene Engineering geologist, por favor llame inmediatamente al 911 o vaya a la sala de emergencias.  Nmeros de bper  - Dr. Nehemiah Massed: [phone]  - Dra. Moye: [phone]  - Dra. Nicole Kindred: [phone]  En caso de inclemencias del [place], por favor llame a Johnsie Kindred principal al [phone] para una actualizacin sobre el [place] de cualquier retraso o cierre.  Consejos para la medicacin en dermatologa: Por favor, guarde las cajas en las que vienen los medicamentos de uso tpico para ayudarle a seguir las instrucciones sobre dnde y cmo usarlos. Las farmacias  generalmente imprimen las instrucciones del medicamento slo en las cajas y no directamente en los tubos del [place].   Si su medicamento es muy caro, por favor, pngase en contacto con Zigmund Daniel llamando al [phone] y presione la opcin 4 o envenos un mensaje a travs de Pharmacist, community.   No podemos decirle cul ser su copago por los medicamentos por adelantado ya que esto es diferente dependiendo de la cobertura de su seguro. Sin embargo, es posible que podamos encontrar un medicamento sustituto a Electrical engineer un formulario para que el seguro cubra el medicamento que se considera necesario.   Si se requiere una autorizacin previa para que su compaa de seguros [place] su medicamento, por favor permtanos de 1 a 2 das hbiles para completar este proceso.  Los precios de los medicamentos varan con frecuencia dependiendo del Environmental consultant de dnde se surte la receta y alguna farmacias pueden ofrecer precios ms baratos.  El sitio web www.goodrx.com tiene cupones para medicamentos de Airline pilot. Los precios aqu no tienen en cuenta lo que podra costar con la ayuda del seguro (puede ser ms barato con su seguro), pero el sitio web puede darle el precio si no utiliz Research scientist (physical sciences).  - Puede imprimir el cupn correspondiente y llevarlo con su receta a la farmacia.  - Tambin puede pasar por nuestra oficina durante el horario de atencin regular y Charity fundraiser una tarjeta de cupones de GoodRx.  - Si necesita que su receta se enve electrnicamente a una farmacia diferente, informe a nuestra oficina a travs de MyChart de [place] o por telfono llamando al [phone] y presione la opcin 4.

## 2021-10-18 NOTE — Progress Notes (Signed)
? ?Follow-Up Visit ?  ?Subjective  ?Rebecca Watkins is a 66 y.o. female who presents for the following: Follow-up (Patient here today for tbse. Patient reports a spot at left forearm that she would like checked. Patient reports her husband seen a few spots that looked concerning at back.   Patient has history of bcc. ). ?The patient presents for Total-Body Skin Exam (TBSE) for skin cancer screening and mole check.  The patient has spots, moles and lesions to be evaluated, some may be new or changing and the patient has concerns that these could be cancer. ? ?The following portions of the chart were reviewed this encounter and updated as appropriate:  Tobacco  Allergies  Meds  Problems  Med Hx  Surg Hx  Fam Hx   ?  ?Review of Systems: No other skin or systemic complaints except as noted in HPI or Assessment and Plan. ? ?Objective  ?Well appearing patient in no apparent distress; mood and affect are within normal limits. ? ?A full examination was performed including scalp, head, eyes, ears, nose, lips, neck, chest, axillae, abdomen, back, buttocks, bilateral upper extremities, bilateral lower extremities, hands, feet, fingers, toes, fingernails, and toenails. All findings within normal limits unless otherwise noted below. ? ?back x 3, right forearm x 1 (4) ?Erythematous stuck-on, waxy papule or plaque ? ? ?Assessment & Plan  ?Inflamed seborrheic keratosis (4) ?back x 3, right forearm x 1 ? ?Irritated and bothers patient  ? ?Destruction of lesion - back x 3, right forearm x 1 ?Complexity: simple   ?Destruction method: cryotherapy   ?Informed consent: discussed and consent obtained   ?Timeout:  patient name, date of birth, surgical site, and procedure verified ?Lesion destroyed using liquid nitrogen: Yes   ?Region frozen until ice ball extended beyond lesion: Yes   ?Outcome: patient tolerated procedure well with no complications   ?Post-procedure details: wound care instructions given   ?Additional details:  Prior  to procedure, discussed risks of blister formation, small wound, skin dyspigmentation, or rare scar following cryotherapy. Recommend Vaseline ointment to treated areas while healing. ? ? ?Skin cancer screening ? ?Lentigines ?- Scattered tan macules ?- Due to sun exposure ?- Benign-appearing, observe ?- Recommend daily broad spectrum sunscreen SPF 30+ to sun-exposed areas, reapply every 2 hours as needed. ?- Call for any changes ? ?Seborrheic Keratoses ?- Stuck-on, waxy, tan-brown papules and/or plaques  ?- Benign-appearing ?- Discussed benign etiology and prognosis. ?- Observe ?- Call for any changes ? ?Purpura - Chronic; persistent and recurrent.  Treatable, but not curable. ?- Violaceous macules and patches ?- Benign ?- Related to trauma, age, sun damage and/or use of blood thinners, chronic use of topical and/or oral steroids ?- Observe ?- Can use OTC arnica containing moisturizer such as Dermend Bruise Formula if desired ?- Call for worsening or other concerns ? ?Melanocytic Nevi ?- Tan-brown and/or pink-flesh-colored symmetric macules and papules ?- Benign appearing on exam today ?- Observation ?- Call clinic for new or changing moles ?- Recommend daily use of broad spectrum spf 30+ sunscreen to sun-exposed areas.  ? ?Hemangiomas ?- Red papules ?- Discussed benign nature ?- Observe ?- Call for any changes ? ?Actinic Damage ?- Chronic condition, secondary to cumulative UV/sun exposure ?- diffuse scaly erythematous macules with underlying dyspigmentation ?- Recommend daily broad spectrum sunscreen SPF 30+ to sun-exposed areas, reapply every 2 hours as needed.  ?- Staying in the shade or wearing long sleeves, sun glasses (UVA+UVB protection) and wide brim hats (4-inch brim around the  entire circumference of the hat) are also recommended for sun protection.  ?- Call for new or changing lesions. ? ?History of Basal Cell Carcinoma of the Skin ?- No evidence of recurrence today left upper back (2014) ?- Recommend  regular full body skin exams ?- Recommend daily broad spectrum sunscreen SPF 30+ to sun-exposed areas, reapply every 2 hours as needed.  ?- Call if any new or changing lesions are noted between office visits ? ?Skin cancer screening performed today. ?Return for 1 year tbse . ?Garry Heater, CMA, am acting as scribe for Sarina Ser, MD. ?Documentation: I have reviewed the above documentation for accuracy and completeness, and I agree with the above. ? ?Sarina Ser, MD ? ?

## 2021-10-20 ENCOUNTER — Encounter: Payer: Self-pay | Admitting: Dermatology

## 2021-11-06 ENCOUNTER — Other Ambulatory Visit: Payer: Self-pay

## 2021-11-06 MED ORDER — GABAPENTIN 300 MG PO CAPS
ORAL_CAPSULE | ORAL | 3 refills | Status: DC
  Filled 2021-11-06: qty 90, 90d supply, fill #0
  Filled 2022-02-12: qty 90, 90d supply, fill #1
  Filled 2022-05-15: qty 90, 90d supply, fill #2
  Filled 2022-08-15: qty 90, 90d supply, fill #3

## 2021-12-12 DIAGNOSIS — N9489 Other specified conditions associated with female genital organs and menstrual cycle: Secondary | ICD-10-CM | POA: Diagnosis not present

## 2022-01-01 ENCOUNTER — Other Ambulatory Visit: Payer: Self-pay

## 2022-02-12 ENCOUNTER — Other Ambulatory Visit: Payer: Self-pay

## 2022-03-04 ENCOUNTER — Other Ambulatory Visit: Payer: Self-pay

## 2022-03-05 ENCOUNTER — Other Ambulatory Visit: Payer: Self-pay

## 2022-03-05 DIAGNOSIS — R300 Dysuria: Secondary | ICD-10-CM | POA: Diagnosis not present

## 2022-03-05 MED ORDER — NITROFURANTOIN MONOHYD MACRO 100 MG PO CAPS
100.0000 mg | ORAL_CAPSULE | Freq: Two times a day (BID) | ORAL | 0 refills | Status: AC
  Filled 2022-03-05: qty 14, 7d supply, fill #0

## 2022-03-18 DIAGNOSIS — R310 Gross hematuria: Secondary | ICD-10-CM | POA: Diagnosis not present

## 2022-04-08 DIAGNOSIS — R2689 Other abnormalities of gait and mobility: Secondary | ICD-10-CM | POA: Diagnosis not present

## 2022-04-08 DIAGNOSIS — G629 Polyneuropathy, unspecified: Secondary | ICD-10-CM | POA: Diagnosis not present

## 2022-04-08 DIAGNOSIS — G2581 Restless legs syndrome: Secondary | ICD-10-CM | POA: Diagnosis not present

## 2022-04-08 DIAGNOSIS — R29898 Other symptoms and signs involving the musculoskeletal system: Secondary | ICD-10-CM | POA: Diagnosis not present

## 2022-04-08 DIAGNOSIS — R32 Unspecified urinary incontinence: Secondary | ICD-10-CM | POA: Diagnosis not present

## 2022-04-11 ENCOUNTER — Other Ambulatory Visit: Payer: Self-pay

## 2022-05-15 ENCOUNTER — Other Ambulatory Visit: Payer: Self-pay

## 2022-07-15 ENCOUNTER — Other Ambulatory Visit: Payer: Self-pay

## 2022-07-23 ENCOUNTER — Other Ambulatory Visit: Payer: Self-pay

## 2022-07-23 ENCOUNTER — Other Ambulatory Visit: Payer: Self-pay | Admitting: Obstetrics and Gynecology

## 2022-07-23 DIAGNOSIS — Z1211 Encounter for screening for malignant neoplasm of colon: Secondary | ICD-10-CM | POA: Diagnosis not present

## 2022-07-23 DIAGNOSIS — Z124 Encounter for screening for malignant neoplasm of cervix: Secondary | ICD-10-CM | POA: Diagnosis not present

## 2022-07-23 DIAGNOSIS — Z1231 Encounter for screening mammogram for malignant neoplasm of breast: Secondary | ICD-10-CM | POA: Diagnosis not present

## 2022-07-23 MED ORDER — PREMPRO 0.45-1.5 MG PO TABS
1.0000 | ORAL_TABLET | Freq: Every day | ORAL | 11 refills | Status: DC
Start: 2022-07-23 — End: 2023-07-25
  Filled 2022-07-23 – 2022-09-05 (×2): qty 28, 28d supply, fill #0
  Filled 2022-11-04: qty 28, 28d supply, fill #1
  Filled 2022-12-30: qty 28, 28d supply, fill #2
  Filled 2023-02-26: qty 28, 28d supply, fill #3
  Filled 2023-04-28: qty 28, 28d supply, fill #4
  Filled 2023-06-17: qty 28, 28d supply, fill #5

## 2022-07-29 ENCOUNTER — Other Ambulatory Visit: Payer: Self-pay | Admitting: Internal Medicine

## 2022-07-29 DIAGNOSIS — Z780 Asymptomatic menopausal state: Secondary | ICD-10-CM | POA: Diagnosis not present

## 2022-07-29 DIAGNOSIS — M85859 Other specified disorders of bone density and structure, unspecified thigh: Secondary | ICD-10-CM | POA: Diagnosis not present

## 2022-07-29 DIAGNOSIS — Z0000 Encounter for general adult medical examination without abnormal findings: Secondary | ICD-10-CM

## 2022-07-29 DIAGNOSIS — G959 Disease of spinal cord, unspecified: Secondary | ICD-10-CM | POA: Diagnosis not present

## 2022-07-29 DIAGNOSIS — D696 Thrombocytopenia, unspecified: Secondary | ICD-10-CM | POA: Diagnosis not present

## 2022-07-29 DIAGNOSIS — I10 Essential (primary) hypertension: Secondary | ICD-10-CM | POA: Diagnosis not present

## 2022-07-29 DIAGNOSIS — G629 Polyneuropathy, unspecified: Secondary | ICD-10-CM | POA: Diagnosis not present

## 2022-07-29 DIAGNOSIS — Z79899 Other long term (current) drug therapy: Secondary | ICD-10-CM | POA: Diagnosis not present

## 2022-08-08 ENCOUNTER — Ambulatory Visit
Admission: RE | Admit: 2022-08-08 | Discharge: 2022-08-08 | Disposition: A | Discharge status: Home / Self Care | Payer: PPO | Source: Ambulatory Visit | Attending: Internal Medicine | Admitting: Internal Medicine

## 2022-08-08 DIAGNOSIS — Z780 Asymptomatic menopausal state: Secondary | ICD-10-CM | POA: Diagnosis not present

## 2022-08-08 DIAGNOSIS — G959 Disease of spinal cord, unspecified: Secondary | ICD-10-CM | POA: Diagnosis not present

## 2022-08-08 DIAGNOSIS — Z0000 Encounter for general adult medical examination without abnormal findings: Secondary | ICD-10-CM | POA: Diagnosis not present

## 2022-08-08 DIAGNOSIS — M47812 Spondylosis without myelopathy or radiculopathy, cervical region: Secondary | ICD-10-CM | POA: Diagnosis not present

## 2022-08-30 DIAGNOSIS — R2689 Other abnormalities of gait and mobility: Secondary | ICD-10-CM | POA: Diagnosis not present

## 2022-08-30 DIAGNOSIS — R29898 Other symptoms and signs involving the musculoskeletal system: Secondary | ICD-10-CM | POA: Diagnosis not present

## 2022-08-30 DIAGNOSIS — M6281 Muscle weakness (generalized): Secondary | ICD-10-CM | POA: Diagnosis not present

## 2022-08-30 DIAGNOSIS — R296 Repeated falls: Secondary | ICD-10-CM | POA: Diagnosis not present

## 2022-08-30 DIAGNOSIS — G629 Polyneuropathy, unspecified: Secondary | ICD-10-CM | POA: Diagnosis not present

## 2022-09-05 ENCOUNTER — Other Ambulatory Visit: Payer: Self-pay

## 2022-09-12 ENCOUNTER — Other Ambulatory Visit: Payer: Self-pay | Admitting: Student

## 2022-09-12 DIAGNOSIS — R29898 Other symptoms and signs involving the musculoskeletal system: Secondary | ICD-10-CM

## 2022-09-12 DIAGNOSIS — R296 Repeated falls: Secondary | ICD-10-CM

## 2022-09-12 DIAGNOSIS — R2689 Other abnormalities of gait and mobility: Secondary | ICD-10-CM

## 2022-09-25 ENCOUNTER — Ambulatory Visit
Admission: RE | Admit: 2022-09-25 | Discharge: 2022-09-25 | Disposition: A | Discharge status: Home / Self Care | Payer: PPO | Source: Ambulatory Visit | Attending: Student | Admitting: Student

## 2022-09-25 DIAGNOSIS — R29898 Other symptoms and signs involving the musculoskeletal system: Secondary | ICD-10-CM

## 2022-09-25 DIAGNOSIS — I6782 Cerebral ischemia: Secondary | ICD-10-CM | POA: Diagnosis not present

## 2022-09-25 DIAGNOSIS — R2689 Other abnormalities of gait and mobility: Secondary | ICD-10-CM

## 2022-09-25 DIAGNOSIS — R296 Repeated falls: Secondary | ICD-10-CM | POA: Diagnosis not present

## 2022-09-27 DIAGNOSIS — G959 Disease of spinal cord, unspecified: Secondary | ICD-10-CM | POA: Diagnosis not present

## 2022-09-27 DIAGNOSIS — R296 Repeated falls: Secondary | ICD-10-CM | POA: Diagnosis not present

## 2022-09-27 DIAGNOSIS — G2581 Restless legs syndrome: Secondary | ICD-10-CM | POA: Diagnosis not present

## 2022-09-27 DIAGNOSIS — G629 Polyneuropathy, unspecified: Secondary | ICD-10-CM | POA: Diagnosis not present

## 2022-09-27 DIAGNOSIS — R29898 Other symptoms and signs involving the musculoskeletal system: Secondary | ICD-10-CM | POA: Diagnosis not present

## 2022-10-07 ENCOUNTER — Ambulatory Visit
Admission: RE | Admit: 2022-10-07 | Discharge: 2022-10-07 | Disposition: A | Discharge status: Home / Self Care | Payer: PPO | Source: Ambulatory Visit | Attending: Obstetrics and Gynecology | Admitting: Obstetrics and Gynecology

## 2022-10-07 DIAGNOSIS — Z1231 Encounter for screening mammogram for malignant neoplasm of breast: Secondary | ICD-10-CM | POA: Diagnosis not present

## 2022-10-16 ENCOUNTER — Ambulatory Visit: Payer: PPO | Attending: Neurology | Admitting: Physical Therapy

## 2022-10-16 DIAGNOSIS — R2681 Unsteadiness on feet: Secondary | ICD-10-CM | POA: Insufficient documentation

## 2022-10-16 DIAGNOSIS — R296 Repeated falls: Secondary | ICD-10-CM | POA: Diagnosis not present

## 2022-10-16 NOTE — Therapy (Signed)
OUTPATIENT PHYSICAL THERAPY NEURO EVALUATION   Patient Name: Rebecca Watkins MRN: [id number] DOB:07-23-1956, 67 y.o., female Today's Date: 10/16/2022   PCP: Tressia Miners MD  REFERRING PROVIDER: Manuella Ghazi MD  END OF SESSION:  PT End of Session - 10/16/22 1305     Visit Number 1    Number of Visits 17    Date for PT Re-Evaluation 12/20/22    Authorization - Visit Number 1    Authorization - Number of Visits 10    Progress Note Due on Visit 10    PT Start Time 1300    PT Stop Time 1345    PT Time Calculation (min) 45 min    Activity Tolerance Patient tolerated treatment well    Behavior During Therapy [facility] for tasks assessed/performed             Past Medical History:  Diagnosis Date   Arthritis    Osteoarthritis   Basal cell carcinoma 04/29/2013   left upper back   Diverticulitis 2017   PONV (postoperative nausea and vomiting)    Temporary low platelet count ([place])    H/O   Past Surgical History:  Procedure Laterality Date   COLONOSCOPY WITH PROPOFOL N/A 12/15/2015   Procedure: COLONOSCOPY WITH PROPOFOL;  Surgeon: Manya Silvas, MD;  Location: [facility] ENDOSCOPY;  Service: Endoscopy;  Laterality: N/A;   KNEE ARTHROSCOPY WITH MEDIAL MENISECTOMY Left 10/31/2016   Procedure: KNEE ARTHROSCOPY WITH MEDIAL MENISECTOMY;  Surgeon: Thornton Park, MD;  Location: [facility] ORS;  Service: Orthopedics;  Laterality: Left;   TOTAL KNEE ARTHROPLASTY Left 06/13/2017   Procedure: TOTAL KNEE ARTHROPLASTY;  Surgeon: Thornton Park, MD;  Location: [facility] ORS;  Service: Orthopedics;  Laterality: Left;   TUBAL LIGATION     Patient Active Problem List   Diagnosis Date Noted   S/P total knee arthroplasty, left 06/13/2017   Bruising 03/25/2017   Pre-op evaluation 10/25/2016   Bilateral knee pain 11/01/2015   Thrombocytopenia ([place]) 11/01/2015   AP (abdominal pain)    Diverticulitis large intestine 10/01/2015    ONSET DATE: Oct 2018  REFERRING DIAG: Frequent falls  THERAPY DIAG:  Repeated  falls  Unsteadiness on feet  Rationale for Evaluation and Treatment: Rehabilitation  SUBJECTIVE:                                                                                                                                                                                             SUBJECTIVE STATEMENT: Frequent falls Pt accompanied by: self  PERTINENT HISTORY: Pt is a 67 year old female presenting following multiple falls over the past year. Pertinent history of L TKA Oct 2018 with  extensive bout of PT (through April 2019) and testing following due to lack of predictable progress. EMG of LLE March 2020 demonstrating generalized severe sensorimotor polyneuropathy. Known imaging of MRI brain with contrast and cspine MRI unremarkable for current symptoms. Pt endorses 4 falls in the past 6 months, reporting they were all while walking some inside, some outside, reporting she has fallen forward and backward. Reports feeling more unsteady walking in the dark. Denies any severe injuries from falling, dizziness, LOC, visual changes. When asked on how falls are occurring reports "I just go down" not that she is tripping on anything or her own feet. Pt ambulates without AD. Has step over tub with grab bars. Pt lives in a one story home; with 4 steps without handrail, and reports she feels unsteady on this. No falls on step, but has tripped on curb. Lives at home with her husband, reports she walks holding his arm in the community to steady herself, but he provides no other physical assistance for her. Pt is retired, previously enjoyed road biking, and running prior to surgery. Reports she is unable to ply with her grandchildren due to fear of falling. She completes weight lifting 3x/week 31mn walking on treadmill, and full body weight lifting. Has a hard time getting in and out of the floor.  Pain in L knee ranges from 3/10 - 8/10; currently: 3/10. Knee pain is worsened by closed chain bending (leg press,  eliptical, squatting), and cold weather. This pain is sharp and stabbing.   PAIN:  Are you having pain? Yes: NPRS scale: 3/10 Pain location: L knee Pain description: Sharp stabbing Aggravating factors: closed chain bending (leg press, eliptical, squatting), and cold weather Relieving factors: rest  PRECAUTIONS: Fall  WEIGHT BEARING RESTRICTIONS: No  FALLS: Has patient fallen in last 6 months? Yes. Number of falls 4  LIVING ENVIRONMENT: Lives with: lives with their spouse Lives in: House/apartment Stairs: Yes: External: 4 steps; none Has following equipment at home: None  PLOF: Independent with basic ADLs  PATIENT GOALS: Decrease my falls  OBJECTIVE:   DIAGNOSTIC FINDINGS: unremarkable ead CT 09/26/22; unremarkable cervical MRI 08/10/22 EMG of L lower leg "severe neuropathy  COGNITION: Overall cognitive status: Within functional limits for tasks assessed   SENSATION: Severe neuropathy in LLE ; mild in RLE  COORDINATION: WNL  EDEMA:  None noted  MUSCLE LENGTH: Hamstrings: Right ~10% limited; Left shortened 25%  Elys test: shortened bilat L.R  DTRs:  Patella 2+ = Normal and Achilles 1 = Trace   POSTURE: decreased lumbar lordosis, increased thoracic kyphosis, and weight shift right  LOWER EXTREMITY ROM:     Active /PROM Right Eval Left Eval  Hip flexion WNL 25% limited/WNL  Hip extension 10% limited bilat  Hip abduction    Hip adduction    Hip internal rotation 50% limited bilat AROM and PROM  Hip external rotation WNL WNL  Knee flexion 25% limited bilat AROM and PROM  Knee extension wnl wnl  Ankle dorsiflexion 15/15d 5d/11d  Ankle plantarflexion WNL WNL  Ankle inversion    Ankle eversion     (Blank rows = not tested)  LOWER EXTREMITY MMT:    MMT Right Eval Left Eval  Hip flexion 4+ 4-  Hip extension 4 4-  Hip abduction 4+ 4+  Hip adduction    Hip internal rotation 5 5  Hip external rotation 5 4+  Knee flexion 4+ 3+   Knee extension 4+ 4+   Ankle dorsiflexion 4+ 3  Ankle plantarflexion  SL heel raise 21 bilat  Ankle inversion    Ankle eversion    (Blank rows = not tested) SL Heel raise R: 21 L: 21  BED MOBILITY:  Sit to supine Complete Independence Supine to sit Complete Independence Rolling to Right Complete Independence Rolling to Left Complete Independence  TRANSFERS: Assistive device utilized: None  Sit to stand: Modified independence UE needed for push off Stand to sit: Modified independence UE needed for control lower Floor: Modified independence Able to rise from floor with heavy use of Ues push up from q-ped   STAIRS: Level of Assistance: CGA Stair Negotiation Technique: Alternating Pattern  with No Rails Number of Stairs: 4  Height of Stairs: 6in  Comments: decreased speed for ascent and descent; HHA needed for confidence with step down  GAIT: Gait pattern:  R trunk lean throughout, minimal L foot clearance with slight hike compensation  Distance walked: 36M Assistive device utilized: None Level of assistance: Modified independence Comments: fastest 1.7ms self selected 0.854mec  FUNCTIONAL TESTS:  5 times sit to stand: 19sec  Functional gait assessment: 16/30 SLS R: 6sec L2sec  PATIENT SURVEYS:  FOTO 46 goal of 54  TODAY'S TREATMENT:                                                                                                                              DATE: 10/16/22  PT reviewed the following HEP with patient with patient able to demonstrate a set of the following with min cuing for correction needed. PT educated patient on parameters of therex (how/when to inc/decrease intensity, frequency, rep/set range, stretch hold time, and purpose of therex) with verbalized understanding. Education on aspects of dynamic balance (proprioception, sensation, vision, and strength) and training for each with understanding  Access Code: [email or web address] - Standing Single Leg Stance with Counter Support  - 1-2 x  daily - 7 x weekly - 30 hold - Standing Toe Raises at Chair  - 1 x daily - 7 x weekly - 12-20 reps    PATIENT EDUCATION: Education details: Patient was educated on diagnosis, anatomy and pathology involved, prognosis, role of PT, and was given an HEP, demonstrating exercise with proper form following verbal and tactile cues, and was given a paper hand out to continue exercise at home. Pt was educated on and agreed to plan of care.  Person educated: Patient Education method: Explanation, Demonstration, and Handouts Education comprehension: verbalized understanding and returned demonstration  HOME EXERCISE PROGRAM: [email or web address]  GOALS: Goals reviewed with patient? Yes  SHORT TERM GOALS: Target date:   Pt will be independent with HEP in order to improve strength and balance in order to decrease fall risk and improve function at home and work.  Baseline: HEP given  Goal status: INITIAL   LONG TERM GOALS: Target date: 12/20/22  Patient will increase FOTO score to 54 to demonstrate predicted increase in functional mobility to complete ADLs  Baseline: 46 Goal status: INITIAL  2.  Pt will demonstrate a score of 22/30 or higher on FGA to demonstrate decreased fall risk  Baseline: 16/30 Goal status: INITIAL  3.  Pt will demonstrate SLS of 18sec each LLE to demonstrate age matched norms of static balance Baseline: R: 6sec L 2sec Goal status: INITIAL  4.  Pt will decrease worst pain as reported on NPRS by at least 3 points in order to demonstrate clinically significant reduction in pain.  Baseline: 6/10 L knee pain  Goal status: INITIAL  5.  Pt will decrease 5TSTS by at least 3 seconds in order to demonstrate clinically significant improvement in LE strength  Baseline: 19sec Goal status: INITIAL   ASSESSMENT:  CLINICAL IMPRESSION: Patient is a 67 y.o. female who was seen today for physical therapy evaluation and treatment for frequent falls. Pertinent history of L TKA Oct 2018 with  extensive bout of PT (through April 2019) and testing following due to lack of predictable progress. EMG of LLE March 2020 demonstrating generalized severe sensorimotor polyneuropathy. Known imaging of MRI brain with contrast and cspine MRI unremarkable for current symptoms. Patient with impairments of decreased L ankle DF PROM/AROM, LLE strength, core strength, abnormal gait, decreased dynamic balance, decreased static balance, and pain. Activity limitations in stair negotiation, curb negotiation, community and household ambulation, transfers from chair and floor, squatting, and stooping; inhibiting participation in community and household ADLs, and increasing fall risk. Would benefit from skilled PT to address above deficits and promote optimal return to PLOF.   OBJECTIVE IMPAIRMENTS: Abnormal gait, cardiopulmonary status limiting activity, decreased activity tolerance, decreased balance, decreased coordination, decreased endurance, decreased mobility, difficulty walking, decreased ROM, decreased strength, increased fascial restrictions, impaired flexibility, impaired UE functional use, improper body mechanics, postural dysfunction, and pain.   ACTIVITY LIMITATIONS: carrying, lifting, bending, standing, stairs, transfers, and bathing  PARTICIPATION LIMITATIONS: meal prep, cleaning, laundry, shopping, community activity, and yard work  PERSONAL FACTORS: Age, Education, Past/current experiences, Time since onset of injury/illness/exacerbation, and 1-2 comorbidities: OA, chronic pain, LLE severe neuropathy  are also affecting patient's functional outcome.   REHAB POTENTIAL: Good  CLINICAL DECISION MAKING: Evolving/moderate complexity  EVALUATION COMPLEXITY: Moderate  PLAN:  PT FREQUENCY: 1-2x/week  PT DURATION: 8 weeks  PLANNED INTERVENTIONS: Therapeutic exercises, Therapeutic activity, Neuromuscular re-education, Balance training, Gait training, Patient/Family education, Self Care, Joint  mobilization, Joint manipulation, DME instructions, Dry Needling, Electrical stimulation, Spinal manipulation, Spinal mobilization, Cryotherapy, Moist heat, Traction, Ultrasound, Manual therapy, and Re-evaluation  PLAN FOR NEXT SESSION: plank test, ankle mobility assessment  Durwin Reges DPT Durwin Reges, PT 10/16/2022, 2:27 PM

## 2022-10-21 ENCOUNTER — Encounter: Payer: Self-pay | Admitting: Physical Therapy

## 2022-10-21 ENCOUNTER — Ambulatory Visit: Payer: PPO | Attending: Neurology | Admitting: Physical Therapy

## 2022-10-21 DIAGNOSIS — R2681 Unsteadiness on feet: Secondary | ICD-10-CM | POA: Insufficient documentation

## 2022-10-21 DIAGNOSIS — R296 Repeated falls: Secondary | ICD-10-CM

## 2022-10-21 NOTE — Therapy (Signed)
OUTPATIENT PHYSICAL THERAPY NEURO EVALUATION   Patient Name: Rebecca Watkins MRN: [id number]:[id number] DOB:Sep 24, 1955, 67 y.o., female Today's Date: 10/21/2022   PCP: Tressia Miners MD  REFERRING PROVIDER: Manuella Ghazi MD  END OF SESSION:    Past Medical History:  Diagnosis Date   Arthritis    Osteoarthritis   Basal cell carcinoma 04/29/2013   left upper back   Diverticulitis 2017   PONV (postoperative nausea and vomiting)    Temporary low platelet count ([place])    H/O   Past Surgical History:  Procedure Laterality Date   COLONOSCOPY WITH PROPOFOL N/A 12/15/2015   Procedure: COLONOSCOPY WITH PROPOFOL;  Surgeon: Manya Silvas, MD;  Location: [facility] ENDOSCOPY;  Service: Endoscopy;  Laterality: N/A;   KNEE ARTHROSCOPY WITH MEDIAL MENISECTOMY Left 10/31/2016   Procedure: KNEE ARTHROSCOPY WITH MEDIAL MENISECTOMY;  Surgeon: Thornton Park, MD;  Location: [facility] ORS;  Service: Orthopedics;  Laterality: Left;   TOTAL KNEE ARTHROPLASTY Left 06/13/2017   Procedure: TOTAL KNEE ARTHROPLASTY;  Surgeon: Thornton Park, MD;  Location: [facility] ORS;  Service: Orthopedics;  Laterality: Left;   TUBAL LIGATION     Patient Active Problem List   Diagnosis Date Noted   S/P total knee arthroplasty, left 06/13/2017   Bruising 03/25/2017   Pre-op evaluation 10/25/2016   Bilateral knee pain 11/01/2015   Thrombocytopenia ([facility]) 11/01/2015   AP (abdominal pain)    Diverticulitis large intestine 10/01/2015    ONSET DATE: Oct 2018  REFERRING DIAG: Frequent falls  THERAPY DIAG:  No diagnosis found.  Rationale for Evaluation and Treatment: Rehabilitation  SUBJECTIVE:                                                                                                                                                                                             SUBJECTIVE STATEMENT: Pt reports no new pain, or falls since last visit. Has been completing HEP   PERTINENT HISTORY: Pt is a 67 year old female presenting following  multiple falls over the past year. Pertinent history of L TKA Oct 2018 with extensive bout of PT (through April 2019) and testing following due to lack of predictable progress. EMG of LLE March 2020 demonstrating generalized severe sensorimotor polyneuropathy. Known imaging of MRI brain with contrast and cspine MRI unremarkable for current symptoms. Pt endorses 4 falls in the past 6 months, reporting they were all while walking some inside, some outside, reporting she has fallen forward and backward. Reports feeling more unsteady walking in the dark. Denies any severe injuries from falling, dizziness, LOC, visual changes. When asked on how falls are occurring reports "I just go down" not that she is tripping  on anything or her own feet. Pt ambulates without AD. Has step over tub with grab bars. Pt lives in a one story home; with 4 steps without handrail, and reports she feels unsteady on this. No falls on step, but has tripped on curb. Lives at home with her husband, reports she walks holding his arm in the community to steady herself, but he provides no other physical assistance for her. Pt is retired, previously enjoyed road biking, and running prior to surgery. Reports she is unable to ply with her grandchildren due to fear of falling. She completes weight lifting 3x/week 70mn walking on treadmill, and full body weight lifting. Has a hard time getting in and out of the floor.  Pain in L knee ranges from 3/10 - 8/10; currently: 3/10. Knee pain is worsened by closed chain bending (leg press, eliptical, squatting), and cold weather. This pain is sharp and stabbing.   PAIN:  Are you having pain? Yes: NPRS scale: 3/10 Pain location: L knee Pain description: Sharp stabbing Aggravating factors: closed chain bending (leg press, eliptical, squatting), and cold weather Relieving factors: rest  PRECAUTIONS: Fall  WEIGHT BEARING RESTRICTIONS: No  FALLS: Has patient fallen in last 6 months? Yes. Number of falls  4  LIVING ENVIRONMENT: Lives with: lives with their spouse Lives in: House/apartment Stairs: Yes: External: 4 steps; none Has following equipment at home: None  PLOF: Independent with basic ADLs  PATIENT GOALS: Decrease my falls  OBJECTIVE:   DIAGNOSTIC FINDINGS: unremarkable ead CT 09/26/22; unremarkable cervical MRI 08/10/22 EMG of L lower leg "severe neuropathy  COGNITION: Overall cognitive status: Within functional limits for tasks assessed   SENSATION: Severe neuropathy in LLE ; mild in RLE  COORDINATION: WNL  EDEMA:  None noted  MUSCLE LENGTH: Hamstrings: Right ~10% limited; Left shortened 25%  Elys test: shortened bilat L.R  DTRs:  Patella 2+ = Normal and Achilles 1 = Trace   POSTURE: decreased lumbar lordosis, increased thoracic kyphosis, and weight shift right  LOWER EXTREMITY ROM:     Active /PROM Right Eval Left Eval  Hip flexion WNL 25% limited/WNL  Hip extension 10% limited bilat  Hip abduction    Hip adduction    Hip internal rotation 50% limited bilat AROM and PROM  Hip external rotation WNL WNL  Knee flexion 25% limited bilat AROM and PROM  Knee extension wnl wnl  Ankle dorsiflexion 15/15d 5d/11d  Ankle plantarflexion WNL WNL  Ankle inversion    Ankle eversion     (Blank rows = not tested)  LOWER EXTREMITY MMT:    MMT Right Eval Left Eval  Hip flexion 4+ 4-  Hip extension 4 4-  Hip abduction 4+ 4+  Hip adduction    Hip internal rotation 5 5  Hip external rotation 5 4+  Knee flexion 4+ 3+   Knee extension 4+ 4+  Ankle dorsiflexion 4+ 3  Ankle plantarflexion SL heel raise 21 bilat  Ankle inversion    Ankle eversion    (Blank rows = not tested) SL Heel raise R: 21 L: 21  BED MOBILITY:  Sit to supine Complete Independence Supine to sit Complete Independence Rolling to Right Complete Independence Rolling to Left Complete Independence  TRANSFERS: Assistive device utilized: None  Sit to stand: Modified independence UE needed  for push off Stand to sit: Modified independence UE needed for control lower Floor: Modified independence Able to rise from floor with heavy use of Ues push up from q-ped   STAIRS: Level of  Assistance: CGA Stair Negotiation Technique: Alternating Pattern  with No Rails Number of Stairs: 4  Height of Stairs: 6in  Comments: decreased speed for ascent and descent; HHA needed for confidence with step down  GAIT: Gait pattern:  R trunk lean throughout, minimal L foot clearance with slight hike compensation  Distance walked: 51M Assistive device utilized: None Level of assistance: Modified independence Comments: fastest 1.42ms self selected 0.880mec  FUNCTIONAL TESTS:  5 times sit to stand: 19sec  Functional gait assessment: 16/30 SLS R: 6sec L2sec  PATIENT SURVEYS:  FOTO 46 goal of 54  TODAY'S TREATMENT:                                                                                                                              DATE: 10/16/22 Treadmill amb 2.87m56m2mi19m With 10% incline 3min57m1min 34ml down 0% grade 2mph  52mnding toe raises x12; from step with eccentric lower 2x 12  Standing L march with DF against 10# KB 2x 12 with good carry over of initil cuing for technique  DF walks 2x 30ft [id number] carry over of technique   Standing on LLE drawing "BIG"  alphabet with hands with multiple instnces of R foot down for balance  Semi tandem wt'd ball toss at rebounded 2x 12 bilat inc difficulty L tandem > R  gastroc stretch on stair 30sec  PATIENT EDUCATION: Education details: Patient was educated on diagnosis, anatomy and pathology involved, prognosis, role of PT, and was given an HEP, demonstrating exercise with proper form following verbal and tactile cues, and was given a paper hand out to continue exercise at home. Pt was educated on and agreed to plan of care.  Person educated: Patient Education method: Explanation, Demonstration, and Handouts Education  comprehension: verbalized understanding and returned demonstration  HOME EXERCISE PROGRAM: [id number]  GOALS: Goals reviewed with patient? Yes  SHORT TERM GOALS: Target date:   Pt will be independent with HEP in order to improve strength and balance in order to decrease fall risk and improve function at home and work.  Baseline: HEP given  Goal status: INITIAL   LONG TERM GOALS: Target date: 12/20/22  Patient will increase FOTO score to 54 to demonstrate predicted increase in functional mobility to complete ADLs  Baseline: 46 Goal status: INITIAL  2.  Pt will demonstrate a score of 22/30 or higher on FGA to demonstrate decreased fall risk  Baseline: 16/30 Goal status: INITIAL  3.  Pt will demonstrate SLS of 18sec each LLE to demonstrate age matched norms of static balance Baseline: R: 6sec L 2sec Goal status: INITIAL  4.  Pt will decrease worst pain as reported on NPRS by at least 3 points in order to demonstrate clinically significant reduction in pain.  Baseline: 6/10 L knee pain  Goal status: INITIAL  5.  Pt will decrease 5TSTS by at least 3 seconds in order to demonstrate clinically significant improvement in LE strength  Baseline:  19sec Goal status: INITIAL   ASSESSMENT:  CLINICAL IMPRESSION: PT initiated therex progression for increased LLE DF strength, static and dynmic balance with success. Pt is able to comply with all cuing for proper technique of therex with good motivation throughout session. Pt without report of pain throughout session. PT will continue progression as able.    OBJECTIVE IMPAIRMENTS: Abnormal gait, cardiopulmonary status limiting activity, decreased activity tolerance, decreased balance, decreased coordination, decreased endurance, decreased mobility, difficulty walking, decreased ROM, decreased strength, increased fascial restrictions, impaired flexibility, impaired UE functional use, improper body mechanics, postural dysfunction, and pain.    ACTIVITY LIMITATIONS: carrying, lifting, bending, standing, stairs, transfers, and bathing  PARTICIPATION LIMITATIONS: meal prep, cleaning, laundry, shopping, community activity, and yard work  PERSONAL FACTORS: Age, Education, Past/current experiences, Time since onset of injury/illness/exacerbation, and 1-2 comorbidities: OA, chronic pain, LLE severe neuropathy  are also affecting patient's functional outcome.   REHAB POTENTIAL: Good  CLINICAL DECISION MAKING: Evolving/moderate complexity  EVALUATION COMPLEXITY: Moderate  PLAN:  PT FREQUENCY: 1-2x/week  PT DURATION: 8 weeks  PLANNED INTERVENTIONS: Therapeutic exercises, Therapeutic activity, Neuromuscular re-education, Balance training, Gait training, Patient/Family education, Self Care, Joint mobilization, Joint manipulation, DME instructions, Dry Needling, Electrical stimulation, Spinal manipulation, Spinal mobilization, Cryotherapy, Moist heat, Traction, Ultrasound, Manual therapy, and Re-evaluation  PLAN FOR NEXT SESSION: plank test, ankle mobility assessment  Durwin Reges DPT Durwin Reges, PT 10/21/2022, 1:06 PM

## 2022-10-23 ENCOUNTER — Encounter: Payer: Self-pay | Admitting: Physical Therapy

## 2022-10-23 ENCOUNTER — Ambulatory Visit: Payer: PPO | Admitting: Physical Therapy

## 2022-10-23 DIAGNOSIS — R2681 Unsteadiness on feet: Secondary | ICD-10-CM

## 2022-10-23 DIAGNOSIS — R296 Repeated falls: Secondary | ICD-10-CM | POA: Diagnosis not present

## 2022-10-23 NOTE — Therapy (Signed)
OUTPATIENT PHYSICAL THERAPY NEURO EVALUATION   Patient Name: Rebecca Watkins MRN: [place]:[id number] DOB:03/15/1956, 67 y.o., female Today's Date: 10/23/2022   PCP: Tressia Miners MD  REFERRING PROVIDER: Manuella Ghazi MD  END OF SESSION:  PT End of Session - 10/23/22 1348     Visit Number 3    Number of Visits 17    Date for PT Re-Evaluation 12/20/22    Authorization - Visit Number 3    Authorization - Number of Visits 10    Progress Note Due on Visit 10    PT Start Time [id number]    PT Stop Time 1426    PT Time Calculation (min) 38 min    Activity Tolerance Patient tolerated treatment well    Behavior During Therapy [facility] for tasks assessed/performed              Past Medical History:  Diagnosis Date   Arthritis    Osteoarthritis   Basal cell carcinoma 04/29/2013   left upper back   Diverticulitis 2017   PONV (postoperative nausea and vomiting)    Temporary low platelet count ([place])    H/O   Past Surgical History:  Procedure Laterality Date   COLONOSCOPY WITH PROPOFOL N/A 12/15/2015   Procedure: COLONOSCOPY WITH PROPOFOL;  Surgeon: Manya Silvas, MD;  Location: [facility] ENDOSCOPY;  Service: Endoscopy;  Laterality: N/A;   KNEE ARTHROSCOPY WITH MEDIAL MENISECTOMY Left 10/31/2016   Procedure: KNEE ARTHROSCOPY WITH MEDIAL MENISECTOMY;  Surgeon: Thornton Park, MD;  Location: [facility] ORS;  Service: Orthopedics;  Laterality: Left;   TOTAL KNEE ARTHROPLASTY Left 06/13/2017   Procedure: TOTAL KNEE ARTHROPLASTY;  Surgeon: Thornton Park, MD;  Location: [facility] ORS;  Service: Orthopedics;  Laterality: Left;   TUBAL LIGATION     Patient Active Problem List   Diagnosis Date Noted   S/P total knee arthroplasty, left 06/13/2017   Bruising 03/25/2017   Pre-op evaluation 10/25/2016   Bilateral knee pain 11/01/2015   Thrombocytopenia ([place]) 11/01/2015   AP (abdominal pain)    Diverticulitis large intestine 10/01/2015    ONSET DATE: Oct 2018  REFERRING DIAG: Frequent falls  THERAPY DIAG:  Repeated  falls  Unsteadiness on feet  Rationale for Evaluation and Treatment: Rehabilitation  SUBJECTIVE:                                                                                                                                                                                             SUBJECTIVE STATEMENT: Pt reports no new pain, or falls since last visit. Has been completing HEP   PERTINENT HISTORY: Pt is a 67 year old female presenting following multiple falls over the  past year. Pertinent history of L TKA Oct 2018 with extensive bout of PT (through April 2019) and testing following due to lack of predictable progress. EMG of LLE March 2020 demonstrating generalized severe sensorimotor polyneuropathy. Known imaging of MRI brain with contrast and cspine MRI unremarkable for current symptoms. Pt endorses 4 falls in the past 6 months, reporting they were all while walking some inside, some outside, reporting she has fallen forward and backward. Reports feeling more unsteady walking in the dark. Denies any severe injuries from falling, dizziness, LOC, visual changes. When asked on how falls are occurring reports "I just go down" not that she is tripping on anything or her own feet. Pt ambulates without AD. Has step over tub with grab bars. Pt lives in a one story home; with 4 steps without handrail, and reports she feels unsteady on this. No falls on step, but has tripped on curb. Lives at home with her husband, reports she walks holding his arm in the community to steady herself, but he provides no other physical assistance for her. Pt is retired, previously enjoyed road biking, and running prior to surgery. Reports she is unable to ply with her grandchildren due to fear of falling. She completes weight lifting 3x/week 16mn walking on treadmill, and full body weight lifting. Has a hard time getting in and out of the floor.  Pain in L knee ranges from 3/10 - 8/10; currently: 3/10. Knee pain is worsened by  closed chain bending (leg press, eliptical, squatting), and cold weather. This pain is sharp and stabbing.   PAIN:  Are you having pain? Yes: NPRS scale: 3/10 Pain location: L knee Pain description: Sharp stabbing Aggravating factors: closed chain bending (leg press, eliptical, squatting), and cold weather Relieving factors: rest  PRECAUTIONS: Fall  WEIGHT BEARING RESTRICTIONS: No  FALLS: Has patient fallen in last 6 months? Yes. Number of falls 4  LIVING ENVIRONMENT: Lives with: lives with their spouse Lives in: House/apartment Stairs: Yes: External: 4 steps; none Has following equipment at home: None  PLOF: Independent with basic ADLs  PATIENT GOALS: Decrease my falls  OBJECTIVE:   DIAGNOSTIC FINDINGS: unremarkable ead CT 09/26/22; unremarkable cervical MRI 08/10/22 EMG of L lower leg "severe neuropathy  COGNITION: Overall cognitive status: Within functional limits for tasks assessed   SENSATION: Severe neuropathy in LLE ; mild in RLE  COORDINATION: WNL  EDEMA:  None noted  MUSCLE LENGTH: Hamstrings: Right ~10% limited; Left shortened 25%  Elys test: shortened bilat L.R  DTRs:  Patella 2+ = Normal and Achilles 1 = Trace   POSTURE: decreased lumbar lordosis, increased thoracic kyphosis, and weight shift right  LOWER EXTREMITY ROM:     Active /PROM Right Eval Left Eval  Hip flexion WNL 25% limited/WNL  Hip extension 10% limited bilat  Hip abduction    Hip adduction    Hip internal rotation 50% limited bilat AROM and PROM  Hip external rotation WNL WNL  Knee flexion 25% limited bilat AROM and PROM  Knee extension wnl wnl  Ankle dorsiflexion 15/15d 5d/11d  Ankle plantarflexion WNL WNL  Ankle inversion    Ankle eversion     (Blank rows = not tested)  LOWER EXTREMITY MMT:    MMT Right Eval Left Eval  Hip flexion 4+ 4-  Hip extension 4 4-  Hip abduction 4+ 4+  Hip adduction    Hip internal rotation 5 5  Hip external rotation 5 4+  Knee flexion  4+ 3+   Knee extension  4+ 4+  Ankle dorsiflexion 4+ 3  Ankle plantarflexion SL heel raise 21 bilat  Ankle inversion    Ankle eversion    (Blank rows = not tested) SL Heel raise R: 21 L: 21  BED MOBILITY:  Sit to supine Complete Independence Supine to sit Complete Independence Rolling to Right Complete Independence Rolling to Left Complete Independence  TRANSFERS: Assistive device utilized: None  Sit to stand: Modified independence UE needed for push off Stand to sit: Modified independence UE needed for control lower Floor: Modified independence Able to rise from floor with heavy use of Ues push up from q-ped   STAIRS: Level of Assistance: CGA Stair Negotiation Technique: Alternating Pattern  with No Rails Number of Stairs: 4  Height of Stairs: 6in  Comments: decreased speed for ascent and descent; HHA needed for confidence with step down  GAIT: Gait pattern:  R trunk lean throughout, minimal L foot clearance with slight hike compensation  Distance walked: 29M Assistive device utilized: None Level of assistance: Modified independence Comments: fastest 1.51ms self selected 0.863mec  FUNCTIONAL TESTS:  5 times sit to stand: 19sec  Functional gait assessment: 16/30 SLS R: 6sec L2sec  PATIENT SURVEYS:  FOTO 46 goal of 54  TODAY'S TREATMENT:                                                                                                                              DATE: 10/16/22 Treadmill amb 2.33m89m70m60m With 10% incline 4min74m1min 8ml down 0% grade 2mph  333mner step up 8in step 2x 12 bilat with cuing for prevention of toe push off by CLLE; attempted 12in step initially too difficult for technique success  Soccer ball kicks from pass passing to alt feet at random 2x 20 next to bar for balance  Standing on foam narrow BOS with PT tossing ball outside BOS x12; L tandem x12 R tandem x12 with 3x instances of stepping strategy for balance in L tandem  gastroc stretch  on stair 30sec  PATIENT EDUCATION: Education details: Patient was educated on diagnosis, anatomy and pathology involved, prognosis, role of PT, and was given an HEP, demonstrating exercise with proper form following verbal and tactile cues, and was given a paper hand out to continue exercise at home. Pt was educated on and agreed to plan of care.  Person educated: Patient Education method: Explanation, Demonstration, and Handouts Education comprehension: verbalized understanding and returned demonstration  HOME EXERCISE PROGRAM: [id number]  GOALS: Goals reviewed with patient? Yes  SHORT TERM GOALS: Target date:   Pt will be independent with HEP in order to improve strength and balance in order to decrease fall risk and improve function at home and work.  Baseline: HEP given  Goal status: INITIAL   LONG TERM GOALS: Target date: 12/20/22  Patient will increase FOTO score to 54 to demonstrate predicted increase in functional mobility to complete ADLs  Baseline: 46 Goal status: INITIAL  2.  Pt  will demonstrate a score of 22/30 or higher on FGA to demonstrate decreased fall risk  Baseline: 16/30 Goal status: INITIAL  3.  Pt will demonstrate SLS of 18sec each LLE to demonstrate age matched norms of static balance Baseline: R: 6sec L 2sec Goal status: INITIAL  4.  Pt will decrease worst pain as reported on NPRS by at least 3 points in order to demonstrate clinically significant reduction in pain.  Baseline: 6/10 L knee pain  Goal status: INITIAL  5.  Pt will decrease 5TSTS by at least 3 seconds in order to demonstrate clinically significant improvement in LE strength  Baseline: 19sec Goal status: INITIAL   ASSESSMENT:  CLINICAL IMPRESSION: PT continued  therex progression for increased LLE DF strength, and dynmic balance with success. Pt is able to comply with all cuing for proper technique of therex with good motivation throughout session. Pt without report of pain throughout  session. PT will continue progression as able.    OBJECTIVE IMPAIRMENTS: Abnormal gait, cardiopulmonary status limiting activity, decreased activity tolerance, decreased balance, decreased coordination, decreased endurance, decreased mobility, difficulty walking, decreased ROM, decreased strength, increased fascial restrictions, impaired flexibility, impaired UE functional use, improper body mechanics, postural dysfunction, and pain.   ACTIVITY LIMITATIONS: carrying, lifting, bending, standing, stairs, transfers, and bathing  PARTICIPATION LIMITATIONS: meal prep, cleaning, laundry, shopping, community activity, and yard work  PERSONAL FACTORS: Age, Education, Past/current experiences, Time since onset of injury/illness/exacerbation, and 1-2 comorbidities: OA, chronic pain, LLE severe neuropathy  are also affecting patient's functional outcome.   REHAB POTENTIAL: Good  CLINICAL DECISION MAKING: Evolving/moderate complexity  EVALUATION COMPLEXITY: Moderate  PLAN:  PT FREQUENCY: 1-2x/week  PT DURATION: 8 weeks  PLANNED INTERVENTIONS: Therapeutic exercises, Therapeutic activity, Neuromuscular re-education, Balance training, Gait training, Patient/Family education, Self Care, Joint mobilization, Joint manipulation, DME instructions, Dry Needling, Electrical stimulation, Spinal manipulation, Spinal mobilization, Cryotherapy, Moist heat, Traction, Ultrasound, Manual therapy, and Re-evaluation  PLAN FOR NEXT SESSION: plank test, ankle mobility assessment  Durwin Reges DPT Durwin Reges, PT 10/23/2022, 2:28 PM

## 2022-10-24 ENCOUNTER — Ambulatory Visit: Payer: PPO | Admitting: Dermatology

## 2022-10-24 VITALS — BP 127/77 | HR 69

## 2022-10-24 DIAGNOSIS — L578 Other skin changes due to chronic exposure to nonionizing radiation: Secondary | ICD-10-CM | POA: Diagnosis not present

## 2022-10-24 DIAGNOSIS — Z1283 Encounter for screening for malignant neoplasm of skin: Secondary | ICD-10-CM

## 2022-10-24 DIAGNOSIS — L814 Other melanin hyperpigmentation: Secondary | ICD-10-CM

## 2022-10-24 DIAGNOSIS — D229 Melanocytic nevi, unspecified: Secondary | ICD-10-CM

## 2022-10-24 DIAGNOSIS — Z85828 Personal history of other malignant neoplasm of skin: Secondary | ICD-10-CM

## 2022-10-24 DIAGNOSIS — L820 Inflamed seborrheic keratosis: Secondary | ICD-10-CM | POA: Diagnosis not present

## 2022-10-24 DIAGNOSIS — D1801 Hemangioma of skin and subcutaneous tissue: Secondary | ICD-10-CM

## 2022-10-24 DIAGNOSIS — L821 Other seborrheic keratosis: Secondary | ICD-10-CM

## 2022-10-24 NOTE — Patient Instructions (Signed)
Due to recent changes in healthcare laws, you may see results of your pathology and/or laboratory studies on MyChart before the doctors have had a chance to review them. We understand that in some cases there may be results that are confusing or concerning to you. Please understand that not all results are received at the same time and often the doctors may need to interpret multiple results in order to provide you with the best plan of care or course of treatment. Therefore, we ask that you please give us 2 business days to thoroughly review all your results before contacting the office for clarification. Should we see a critical lab result, you will be contacted sooner.   If You Need Anything After Your Visit  If you have any questions or concerns for your doctor, please call our main line at [phone] and press option 4 to reach your doctor's medical assistant. If no one answers, please leave a voicemail as directed and we will return your call as soon as possible. Messages left after 4 pm will be answered the following business day.   You may also send us a message via MyChart. We typically respond to MyChart messages within 1-2 business days.  For prescription refills, please ask your pharmacy to contact our office. Our fax number is [phone].  If you have an urgent issue when the clinic is closed that cannot wait until the next business day, you can page your doctor at the number below.    Please note that while we do our best to be available for urgent issues outside of office hours, we are not available 24/7.   If you have an urgent issue and are unable to reach us, you may choose to seek medical care at your doctor's office, retail clinic, urgent care center, or emergency room.  If you have a medical emergency, please immediately call 911 or go to the emergency department.  Pager Numbers  - Dr. Kowalski: [phone]  - Dr. Moye: [phone]  - Dr. Stewart:  [phone]  In the event of inclement weather, please call our main line at [phone] for an update on the status of any delays or closures.  Dermatology Medication Tips: Please keep the boxes that topical medications come in in order to help keep track of the instructions about where and how to use these. Pharmacies typically print the medication instructions only on the boxes and not directly on the medication tubes.   If your medication is too expensive, please contact our office at [phone] option 4 or send us a message through MyChart.   We are unable to tell what your co-pay for medications will be in advance as this is different depending on your insurance coverage. However, we may be able to find a substitute medication at lower cost or fill out paperwork to get insurance to cover a needed medication.   If a prior authorization is required to get your medication covered by your insurance company, please allow us 1-2 business days to complete this process.  Drug prices often vary depending on where the prescription is filled and some pharmacies may offer cheaper prices.  The website www.goodrx.com contains coupons for medications through different pharmacies. The prices here do not account for what the cost may be with help from insurance (it may be cheaper with your insurance), but the website can give you the price if you did not use any insurance.  - You can print the associated coupon and take it with   your prescription to the pharmacy.  - You may also stop by our office during regular business hours and pick up a GoodRx coupon card.  - If you need your prescription sent electronically to a different pharmacy, notify our office through [facility] MyChart or by phone at [phone] option 4.     Si Usted Necesita Algo Despus de Su Visita  Tambin puede enviarnos un mensaje a travs de MyChart. Por lo general respondemos a los mensajes de MyChart en el transcurso de 1 a 2  das hbiles.  Para renovar recetas, por favor pida a su farmacia que se ponga en contacto con nuestra oficina. Nuestro nmero de fax es el [phone].  Si tiene un asunto urgente cuando la clnica est cerrada y que no puede esperar hasta el siguiente da hbil, puede llamar/localizar a su doctor(a) al nmero que aparece a continuacin.   Por favor, tenga en cuenta que aunque hacemos todo lo posible para estar disponibles para asuntos urgentes fuera del horario de oficina, no estamos disponibles las 24 horas del da, los 7 das de la semana.   Si tiene un problema urgente y no puede comunicarse con nosotros, puede optar por buscar atencin mdica  en el consultorio de su doctor(a), en una clnica privada, en un centro de atencin urgente o en una sala de emergencias.  Si tiene una emergencia mdica, por favor llame inmediatamente al 911 o vaya a la sala de emergencias.  Nmeros de bper  - Dr. Kowalski: [phone]  - Dra. Moye: [phone]  - Dra. Stewart: [phone]  En caso de inclemencias del tiempo, por favor llame a nuestra lnea principal al [phone] para una actualizacin sobre el estado de cualquier retraso o cierre.  Consejos para la medicacin en dermatologa: Por favor, guarde las cajas en las que vienen los medicamentos de uso tpico para ayudarle a seguir las instrucciones sobre dnde y cmo usarlos. Las farmacias generalmente imprimen las instrucciones del medicamento slo en las cajas y no directamente en los tubos del medicamento.   Si su medicamento es muy caro, por favor, pngase en contacto con nuestra oficina llamando al [phone] y presione la opcin 4 o envenos un mensaje a travs de MyChart.   No podemos decirle cul ser su copago por los medicamentos por adelantado ya que esto es diferente dependiendo de la cobertura de su seguro. Sin embargo, es posible que podamos encontrar un medicamento sustituto a menor costo o llenar un formulario para que el  seguro cubra el medicamento que se considera necesario.   Si se requiere una autorizacin previa para que su compaa de seguros cubra su medicamento, por favor permtanos de 1 a 2 das hbiles para completar este proceso.  Los precios de los medicamentos varan con frecuencia dependiendo del lugar de dnde se surte la receta y alguna farmacias pueden ofrecer precios ms baratos.  El sitio web www.goodrx.com tiene cupones para medicamentos de diferentes farmacias. Los precios aqu no tienen en cuenta lo que podra costar con la ayuda del seguro (puede ser ms barato con su seguro), pero el sitio web puede darle el precio si no utiliz ningn seguro.  - Puede imprimir el cupn correspondiente y llevarlo con su receta a la farmacia.  - Tambin puede pasar por nuestra oficina durante el horario de atencin regular y recoger una tarjeta de cupones de GoodRx.  - Si necesita que su receta se enve electrnicamente a una farmacia diferente, informe a nuestra oficina a travs de MyChart de [facility]   o por telfono llamando al [phone] y presione la opcin 4.  

## 2022-10-24 NOTE — Progress Notes (Signed)
   Follow-Up Visit   Subjective  Rebecca Watkins is a 67 y.o. female who presents for the following: Annual Exam (Hx BCC). The patient presents for Total-Body Skin Exam (TBSE) for skin cancer screening and mole check.  The patient has spots, moles and lesions to be evaluated, some may be new or changing and the patient has concerns that these could be cancer.  The following portions of the chart were reviewed this encounter and updated as appropriate:   Tobacco  Allergies  Meds  Problems  Med Hx  Surg Hx  Fam Hx     Review of Systems:  No other skin or systemic complaints except as noted in HPI or Assessment and Plan.  Objective  Well appearing patient in no apparent distress; mood and affect are within normal limits.  A focused examination was performed including the face, scalp, trunk, arms, and legs. Relevant physical exam findings are noted in the Assessment and Plan.  Trunk, extremities x 16 (16) Erythematous stuck-on, waxy papule or plaque   Assessment & Plan  Inflamed seborrheic keratosis (16) Trunk, extremities x 16 Symptomatic, irritating, patient would like treated. Destruction of lesion - Trunk, extremities x 16 Complexity: simple   Destruction method: cryotherapy   Informed consent: discussed and consent obtained   Timeout:  patient name, date of birth, surgical site, and procedure verified Lesion destroyed using liquid nitrogen: Yes   Region frozen until ice ball extended beyond lesion: Yes   Outcome: patient tolerated procedure well with no complications   Post-procedure details: wound care instructions given    Lentigines - Scattered tan macules - Due to sun exposure - Benign-appearing, observe - Recommend daily broad spectrum sunscreen SPF 30+ to sun-exposed areas, reapply every 2 hours as needed. - Call for any changes  Seborrheic Keratoses - Stuck-on, waxy, tan-brown papules and/or plaques  - Benign-appearing - Discussed benign etiology and  prognosis. - Observe - Call for any changes  Melanocytic Nevi - Tan-brown and/or pink-flesh-colored symmetric macules and papules - Benign appearing on exam today - Observation - Call clinic for new or changing moles - Recommend daily use of broad spectrum spf 30+ sunscreen to sun-exposed areas.   Hemangiomas - Red papules - Discussed benign nature - Observe - Call for any changes  Actinic Damage - Chronic condition, secondary to cumulative UV/sun exposure - diffuse scaly erythematous macules with underlying dyspigmentation - Recommend daily broad spectrum sunscreen SPF 30+ to sun-exposed areas, reapply every 2 hours as needed.  - Staying in the shade or wearing long sleeves, sun glasses (UVA+UVB protection) and wide brim hats (4-inch brim around the entire circumference of the hat) are also recommended for sun protection.  - Call for new or changing lesions.  History of Basal Cell Carcinoma of the Skin - No evidence of recurrence today - Recommend regular full body skin exams - Recommend daily broad spectrum sunscreen SPF 30+ to sun-exposed areas, reapply every 2 hours as needed.  - Call if any new or changing lesions are noted between office visits  Skin cancer screening performed today.  Return in about 1 year (around 10/24/2023) for TBSE.  Luther Redo, CMA, am acting as scribe for Sarina Ser, MD . Documentation: I have reviewed the above documentation for accuracy and completeness, and I agree with the above.  Sarina Ser, MD

## 2022-10-27 ENCOUNTER — Encounter: Payer: Self-pay | Admitting: Dermatology

## 2022-10-28 ENCOUNTER — Ambulatory Visit: Payer: PPO

## 2022-10-28 DIAGNOSIS — R296 Repeated falls: Secondary | ICD-10-CM

## 2022-10-28 DIAGNOSIS — R2681 Unsteadiness on feet: Secondary | ICD-10-CM

## 2022-10-28 NOTE — Therapy (Signed)
OUTPATIENT PHYSICAL THERAPY TREATMENT   Patient Name: Rebecca Watkins MRN: [id number]:[id number] DOB:Aug 07, 1956, 67 y.o., female Today's Date: 10/28/2022   PCP: Tressia Miners MD  REFERRING PROVIDER: Manuella Ghazi MD  END OF SESSION:  PT End of Session - 10/28/22 1138     Visit Number 4    Number of Visits 17    Date for PT Re-Evaluation 12/20/22    Authorization Type [employer]    Authorization Time Period 10/16/22-12/20/22    Progress Note Due on Visit 10    PT Start Time 1130    PT Stop Time 1210    PT Time Calculation (min) 40 min    Equipment Utilized During Treatment Gait belt    Activity Tolerance Patient tolerated treatment well;No increased pain    Behavior During Therapy [facility] for tasks assessed/performed              Past Medical History:  Diagnosis Date   Arthritis    Osteoarthritis   Basal cell carcinoma 04/29/2013   left upper back   Diverticulitis 2017   PONV (postoperative nausea and vomiting)    Temporary low platelet count ([place])    H/O   Past Surgical History:  Procedure Laterality Date   COLONOSCOPY WITH PROPOFOL N/A 12/15/2015   Procedure: COLONOSCOPY WITH PROPOFOL;  Surgeon: Manya Silvas, MD;  [facility] ENDOSCOPY;  Service: Endoscopy;  Laterality: N/A;   KNEE ARTHROSCOPY WITH MEDIAL MENISECTOMY Left 10/31/2016   Procedure: KNEE ARTHROSCOPY WITH MEDIAL MENISECTOMY;  Surgeon: Thornton Park, MD;  Location: [facility] ORS;  Service: Orthopedics;  Laterality: Left;   TOTAL KNEE ARTHROPLASTY Left 06/13/2017   Procedure: TOTAL KNEE ARTHROPLASTY;  Surgeon: Thornton Park, MD;  Location: [facility] ORS;  Service: Orthopedics;  Laterality: Left;   TUBAL LIGATION     Patient Active Problem List   Diagnosis Date Noted   S/P total knee arthroplasty, left 06/13/2017   Bruising 03/25/2017   Pre-op evaluation 10/25/2016   Bilateral knee pain 11/01/2015   Thrombocytopenia ([place]) 11/01/2015   AP (abdominal pain)    Diverticulitis large intestine 10/01/2015    ONSET  DATE: Oct 2018  REFERRING DIAG: Frequent falls  THERAPY DIAG:  Repeated falls  Unsteadiness on feet  Rationale for Evaluation and Treatment: Rehabilitation  SUBJECTIVE:                                                                                                                                                                                             SUBJECTIVE STATEMENT: Just finished walking treadmill at ths gym., No pain today. Pt didn't have to do any exercise over the weekend at PT's request. No  recent falls.   PERTINENT HISTORY: Pt is a 67 year old female presenting following multiple falls over the past year. Pertinent history of L TKA Oct 2018 with extensive bout of PT (through April 2019) and testing following due to lack of predictable progress. EMG of LLE March 2020 demonstrating generalized severe sensorimotor polyneuropathy. Known imaging of MRI brain with contrast and cspine MRI unremarkable for current symptoms. Pt endorses 4 falls in the past 6 months, reporting they were all while walking some inside, some outside, reporting she has fallen forward and backward. Reports feeling more unsteady walking in the dark. Denies any severe injuries from falling, dizziness, LOC, visual changes. When asked on how falls are occurring reports "I just go down" not that she is tripping on anything or her own feet. Pt ambulates without AD. Has step over tub with grab bars. Pt lives in a one story home; with 4 steps without handrail, and reports she feels unsteady on this. No falls on step, but has tripped on curb. Lives at home with her husband, reports she walks holding his arm in the community to steady herself, but he provides no other physical assistance for her. Pt is retired, previously enjoyed road biking, and running prior to surgery. Reports she is unable to ply with her grandchildren due to fear of falling. She completes weight lifting 3x/week 3mn walking on treadmill, and full body  weight lifting. Has a hard time getting in and out of the floor.  Pain in L knee ranges from 3/10 - 8/10; currently: 3/10. Knee pain is worsened by closed chain bending (leg press, eliptical, squatting), and cold weather. This pain is sharp and stabbing.   PAIN:  Are you having pain? Yes: NPRS scale: 3/10 Pain location: L knee Pain description: Sharp stabbing Aggravating factors: closed chain bending (leg press, eliptical, squatting), and cold weather Relieving factors: rest  PRECAUTIONS: Fall  WEIGHT BEARING RESTRICTIONS: No  FALLS: Has patient fallen in last 6 months? Yes. Number of falls 4   PATIENT GOALS: Decrease my falls  OBJECTIVE:    LOWER EXTREMITY MMT:    MMT Right Eval Left Eval  Hip flexion 4+ 4-  Hip extension 4 4-  Hip abduction 4+ 4+  Hip adduction    Hip internal rotation 5 5  Hip external rotation 5 4+  Knee flexion 4+ 3+   Knee extension 4+ 4+  Ankle dorsiflexion 4+ 3  Ankle plantarflexion SL heel raise 21 bilat  Ankle inversion    Ankle eversion    (Blank rows = not tested)     TODAY'S TREATMENT:                                                                                                                              DATE: 10/28/22  -10% grade 2.0 mph x 4 minutes, then 2.0 at 5% x 2 minutes, 2% x 2 minutes;  -STS x10, hands free -gastroc stretch on stair  30sec -STS x10, hands free -gastroc stretch on stair 30sec -tandem stance, shod, static, firm surface 2x30sec -airex pad with overhead rebounding c pink physioball x15 -soccer pass kicks/rebounds off double glass door, pink phyioball x2 minutes   -Balance obstacle course overground, 2lb AW bilat: variable surface type, grad, slant, multiple provocations into tandem stance AMB.      PATIENT EDUCATION: Education details: Patient was educated on diagnosis, anatomy and pathology involved, prognosis, role of PT, and was given an HEP, demonstrating exercise with proper form following verbal  and tactile cues, and was given a paper hand out to continue exercise at home. Pt was educated on and agreed to plan of care.  Person educated: Patient Education method: Explanation, Demonstration, and Handouts Education comprehension: verbalized understanding and returned demonstration  HOME EXERCISE PROGRAM: [id number]  GOALS: Goals reviewed with patient? Yes  SHORT TERM GOALS: Target date:   Pt will be independent with HEP in order to improve strength and balance in order to decrease fall risk and improve function at home and work.  Baseline: HEP given  Goal status: INITIAL   LONG TERM GOALS: Target date: 12/20/22  Patient will increase FOTO score to 54 to demonstrate predicted increase in functional mobility to complete ADLs  Baseline: 46 Goal status: INITIAL  2.  Pt will demonstrate a score of 22/30 or higher on FGA to demonstrate decreased fall risk  Baseline: 16/30 Goal status: INITIAL  3.  Pt will demonstrate SLS of 18sec each LLE to demonstrate age matched norms of static balance Baseline: R: 6sec L 2sec Goal status: INITIAL  4.  Pt will decrease worst pain as reported on NPRS by at least 3 points in order to demonstrate clinically significant reduction in pain.  Baseline: 6/10 L knee pain  Goal status: INITIAL  5.  Pt will decrease 5TSTS by at least 3 seconds in order to demonstrate clinically significant improvement in LE strength  Baseline: 19sec Goal status: INITIAL   ASSESSMENT:  CLINICAL IMPRESSION: Continued strength and dynamic balance training. Notable decrease in motor control ability when fatigue sets in. Pt otherwise demonstrates excellent balance and LOB recovery without use of hands most of session.  PT will continue progression as able.    OBJECTIVE IMPAIRMENTS: Abnormal gait, cardiopulmonary status limiting activity, decreased activity tolerance, decreased balance, decreased coordination, decreased endurance, decreased mobility, difficulty  walking, decreased ROM, decreased strength, increased fascial restrictions, impaired flexibility, impaired UE functional use, improper body mechanics, postural dysfunction, and pain.   ACTIVITY LIMITATIONS: carrying, lifting, bending, standing, stairs, transfers, and bathing  PARTICIPATION LIMITATIONS: meal prep, cleaning, laundry, shopping, community activity, and yard work  PERSONAL FACTORS: Age, Education, Past/current experiences, Time since onset of injury/illness/exacerbation, and 1-2 comorbidities: OA, chronic pain, LLE severe neuropathy  are also affecting patient's functional outcome.   REHAB POTENTIAL: Good  CLINICAL DECISION MAKING: Evolving/moderate complexity  EVALUATION COMPLEXITY: Moderate  PLAN:  PT FREQUENCY: 1-2x/week  PT DURATION: 8 weeks  PLANNED INTERVENTIONS: Therapeutic exercises, Therapeutic activity, Neuromuscular re-education, Balance training, Gait training, Patient/Family education, Self Care, Joint mobilization, Joint manipulation, DME instructions, Dry Needling, Electrical stimulation, Spinal manipulation, Spinal mobilization, Cryotherapy, Moist heat, Traction, Ultrasound, Manual therapy, and Re-evaluation  PLAN FOR NEXT SESSION: plank test, ankle mobility assessment   Legend Pecore C, PT 10/28/2022, 11:39 AM  11:40 AM, 10/28/22 Etta Grandchild, PT, DPT Physical Therapist - [place] [phone] (Office)

## 2022-10-31 ENCOUNTER — Ambulatory Visit: Payer: PPO

## 2022-10-31 DIAGNOSIS — R2681 Unsteadiness on feet: Secondary | ICD-10-CM

## 2022-10-31 DIAGNOSIS — R296 Repeated falls: Secondary | ICD-10-CM

## 2022-10-31 NOTE — Therapy (Signed)
OUTPATIENT PHYSICAL THERAPY TREATMENT   Patient Name: Rebecca Watkins MRN: [id number] DOB:Feb 23, 1956, 67 y.o., female Today's Date: 10/31/2022   PCP: Tressia Miners MD  REFERRING PROVIDER: Manuella Ghazi MD  END OF SESSION:  PT End of Session - 10/31/22 0959     Visit Number 5    Number of Visits 17    Date for PT Re-Evaluation 12/20/22    Authorization Type [employer]    Authorization Time Period 10/16/22-12/20/22    Progress Note Due on Visit 10    PT Start Time 1000    PT Stop Time 1042    PT Time Calculation (min) 42 min    Equipment Utilized During Treatment Gait belt    Activity Tolerance Patient tolerated treatment well;No increased pain    Behavior During Therapy [facility] for tasks assessed/performed              Past Medical History:  Diagnosis Date   Arthritis    Osteoarthritis   Basal cell carcinoma 04/29/2013   left upper back   Diverticulitis 2017   PONV (postoperative nausea and vomiting)    Temporary low platelet count ([place])    H/O   Past Surgical History:  Procedure Laterality Date   COLONOSCOPY WITH PROPOFOL N/A 12/15/2015   Procedure: COLONOSCOPY WITH PROPOFOL;  Surgeon: Manya Silvas, MD;  Location: [facility] ENDOSCOPY;  Service: Endoscopy;  Laterality: N/A;   KNEE ARTHROSCOPY WITH MEDIAL MENISECTOMY Left 10/31/2016   Procedure: KNEE ARTHROSCOPY WITH MEDIAL MENISECTOMY;  Surgeon: Thornton Park, MD;  Location: [facility] ORS;  Service: Orthopedics;  Laterality: Left;   TOTAL KNEE ARTHROPLASTY Left 06/13/2017   Procedure: TOTAL KNEE ARTHROPLASTY;  Surgeon: Thornton Park, MD;  Location: [facility] ORS;  Service: Orthopedics;  Laterality: Left;   TUBAL LIGATION     Patient Active Problem List   Diagnosis Date Noted   S/P total knee arthroplasty, left 06/13/2017   Bruising 03/25/2017   Pre-op evaluation 10/25/2016   Bilateral knee pain 11/01/2015   Thrombocytopenia ([place]) 11/01/2015   AP (abdominal pain)    Diverticulitis large intestine 10/01/2015    ONSET  DATE: Oct 2018  REFERRING DIAG: Frequent falls  THERAPY DIAG:  Repeated falls  Unsteadiness on feet  Rationale for Evaluation and Treatment: Rehabilitation  SUBJECTIVE:                                                                                                                                                                                             SUBJECTIVE STATEMENT: Pt reports doing well. No complaints this date with regards to pain. No stumbles or falls. Had fun with the obstacle course last session.  PERTINENT HISTORY: Pt is a 67 year old female presenting following multiple falls over the past year. Pertinent history of L TKA Oct 2018 with extensive bout of PT (through April 2019) and testing following due to lack of predictable progress. EMG of LLE March 2020 demonstrating generalized severe sensorimotor polyneuropathy. Known imaging of MRI brain with contrast and cspine MRI unremarkable for current symptoms. Pt endorses 4 falls in the past 6 months, reporting they were all while walking some inside, some outside, reporting she has fallen forward and backward. Reports feeling more unsteady walking in the dark. Denies any severe injuries from falling, dizziness, LOC, visual changes. When asked on how falls are occurring reports "I just go down" not that she is tripping on anything or her own feet. Pt ambulates without AD. Has step over tub with grab bars. Pt lives in a one story home; with 4 steps without handrail, and reports she feels unsteady on this. No falls on step, but has tripped on curb. Lives at home with her husband, reports she walks holding his arm in the community to steady herself, but he provides no other physical assistance for her. Pt is retired, previously enjoyed road biking, and running prior to surgery. Reports she is unable to ply with her grandchildren due to fear of falling. She completes weight lifting 3x/week 78mn walking on treadmill, and full body weight  lifting. Has a hard time getting in and out of the floor.  Pain in L knee ranges from 3/10 - 8/10; currently: 3/10. Knee pain is worsened by closed chain bending (leg press, eliptical, squatting), and cold weather. This pain is sharp and stabbing.   PAIN:  Are you having pain? Yes: NPRS scale: 3/10 Pain location: L knee Pain description: Sharp stabbing Aggravating factors: closed chain bending (leg press, eliptical, squatting), and cold weather Relieving factors: rest  PRECAUTIONS: Fall  WEIGHT BEARING RESTRICTIONS: No  FALLS: Has patient fallen in last 6 months? Yes. Number of falls 4   PATIENT GOALS: Decrease my falls  OBJECTIVE:    LOWER EXTREMITY MMT:    MMT Right Eval Left Eval  Hip flexion 4+ 4-  Hip extension 4 4-  Hip abduction 4+ 4+  Hip adduction    Hip internal rotation 5 5  Hip external rotation 5 4+  Knee flexion 4+ 3+   Knee extension 4+ 4+  Ankle dorsiflexion 4+ 3  Ankle plantarflexion SL heel raise 21 bilat  Ankle inversion    Ankle eversion    (Blank rows = not tested)     TODAY'S TREATMENT:                                                                                                                              DATE: 10/31/22  There.ex: 10% grade 2.5 mph x 4 minutes, then 2.5 at 6% x 2 minutes, 2% x 2 minutes   STS with feet on airex pad: 3x8, CGA to SBA.  Mild ankle righting throughout. Decreased eccentric control   Gastroc stretch: 2x30 sec with heels over 6" step  Rebounder balance exercise: 2 KG med ball   Tandem: 2x10/LE under BOS   SLS: 3x10/LE. Maintains balance with RLE. Requires light, intermittent R toe touch standing on LLE. More hip righting strategy to correct due to LLE weakness. This improve by second and third sets   Soccer kicks with pink soccer ball: 2 minutes, CGA  Obstacle course x4 forwards and x4 laterally: ambulating over 4" box --> narrowed stance ambulating over 8" step --> 1 foot on airex beam 1 foot on long 1/2  bolster --> ambulation over red mat with hedgehog balls under mat for unstable surfaces. 3# AW's applied, CGA.  Lateral side steps on airex beam only.     PATIENT EDUCATION: Education details: Patient was educated on diagnosis, anatomy and pathology involved, prognosis, role of PT, and was given an HEP, demonstrating exercise with proper form following verbal and tactile cues, and was given a paper hand out to continue exercise at home. Pt was educated on and agreed to plan of care.  Person educated: Patient Education method: Explanation, Demonstration, and Handouts Education comprehension: verbalized understanding and returned demonstration  HOME EXERCISE PROGRAM: [id number]  GOALS: Goals reviewed with patient? Yes  SHORT TERM GOALS: Target date:   Pt will be independent with HEP in order to improve strength and balance in order to decrease fall risk and improve function at home and work.  Baseline: HEP given  Goal status: INITIAL   LONG TERM GOALS: Target date: 12/20/22  Patient will increase FOTO score to 54 to demonstrate predicted increase in functional mobility to complete ADLs  Baseline: 46 Goal status: INITIAL  2.  Pt will demonstrate a score of 22/30 or higher on FGA to demonstrate decreased fall risk  Baseline: 16/30 Goal status: INITIAL  3.  Pt will demonstrate SLS of 18sec each LLE to demonstrate age matched norms of static balance Baseline: R: 6sec L 2sec Goal status: INITIAL  4.  Pt will decrease worst pain as reported on NPRS by at least 3 points in order to demonstrate clinically significant reduction in pain.  Baseline: 6/10 L knee pain  Goal status: INITIAL  5.  Pt will decrease 5TSTS by at least 3 seconds in order to demonstrate clinically significant improvement in LE strength  Baseline: 19sec Goal status: INITIAL   ASSESSMENT:  CLINICAL IMPRESSION: Continuing PT POC with focus on strengthening and dynamic balance. Emphasis on unstable surfaces  this date to challenge vestibular input due to pt's history with BLE peripheral neuropathy to further improve available balance systems to reduce general falls risk. Pt is challenged with SLS and unstable surfaces relying on hip righting reactions and stepping strategy to correct. She is however easily able to regain her LOB without physical support. Pt will continue to benefit from skilled PT services to progress strength and balance to reduce falls risk and address remaining goals.    OBJECTIVE IMPAIRMENTS: Abnormal gait, cardiopulmonary status limiting activity, decreased activity tolerance, decreased balance, decreased coordination, decreased endurance, decreased mobility, difficulty walking, decreased ROM, decreased strength, increased fascial restrictions, impaired flexibility, impaired UE functional use, improper body mechanics, postural dysfunction, and pain.   ACTIVITY LIMITATIONS: carrying, lifting, bending, standing, stairs, transfers, and bathing  PARTICIPATION LIMITATIONS: meal prep, cleaning, laundry, shopping, community activity, and yard work  PERSONAL FACTORS: Age, Education, Past/current experiences, Time since onset of injury/illness/exacerbation, and 1-2 comorbidities: OA, chronic pain, LLE severe neuropathy  are also affecting patient's functional outcome.   REHAB POTENTIAL: Good  CLINICAL DECISION MAKING: Evolving/moderate complexity  EVALUATION COMPLEXITY: Moderate  PLAN:  PT FREQUENCY: 1-2x/week  PT DURATION: 8 weeks  PLANNED INTERVENTIONS: Therapeutic exercises, Therapeutic activity, Neuromuscular re-education, Balance training, Gait training, Patient/Family education, Self Care, Joint mobilization, Joint manipulation, DME instructions, Dry Needling, Electrical stimulation, Spinal manipulation, Spinal mobilization, Cryotherapy, Moist heat, Traction, Ultrasound, Manual therapy, and Re-evaluation  PLAN FOR NEXT SESSION: plank test, ankle mobility assessment   Salem Caster. Fairly IV, PT, DPT Physical Therapist- [facility]  10/31/2022, 11:03 AM

## 2022-11-04 ENCOUNTER — Encounter: Payer: Self-pay | Admitting: Physical Therapy

## 2022-11-04 ENCOUNTER — Other Ambulatory Visit: Payer: Self-pay

## 2022-11-04 ENCOUNTER — Ambulatory Visit: Payer: PPO | Admitting: Physical Therapy

## 2022-11-04 DIAGNOSIS — R2681 Unsteadiness on feet: Secondary | ICD-10-CM

## 2022-11-04 DIAGNOSIS — R296 Repeated falls: Secondary | ICD-10-CM

## 2022-11-04 NOTE — Therapy (Signed)
OUTPATIENT PHYSICAL THERAPY TREATMENT   Patient Name: Rebecca Watkins MRN: [id number] DOB:12-Jan-1956, 67 y.o., female Today's Date: 11/04/2022   PCP: Tressia Miners MD  REFERRING PROVIDER: Manuella Ghazi MD  END OF SESSION:  PT End of Session - 11/04/22 1322     Visit Number 6    Number of Visits 17    Date for PT Re-Evaluation 12/20/22    Authorization Type [employer]    Authorization Time Period 10/16/22-12/20/22    Authorization - Visit Number 6    Authorization - Number of Visits 10    Progress Note Due on Visit 10    PT Start Time 1302    PT Stop Time 1340    PT Time Calculation (min) 38 min    Equipment Utilized During Treatment Gait belt    Activity Tolerance Patient tolerated treatment well;No increased pain    Behavior During Therapy [facility] for tasks assessed/performed               Past Medical History:  Diagnosis Date   Arthritis    Osteoarthritis   Basal cell carcinoma 04/29/2013   left upper back   Diverticulitis 2017   PONV (postoperative nausea and vomiting)    Temporary low platelet count ([place])    H/O   Past Surgical History:  Procedure Laterality Date   COLONOSCOPY WITH PROPOFOL N/A 12/15/2015   Procedure: COLONOSCOPY WITH PROPOFOL;  Surgeon: Manya Silvas, MD;  Location: [facility] ENDOSCOPY;  Service: Endoscopy;  Laterality: N/A;   KNEE ARTHROSCOPY WITH MEDIAL MENISECTOMY Left 10/31/2016   Procedure: KNEE ARTHROSCOPY WITH MEDIAL MENISECTOMY;  Surgeon: Thornton Park, MD;  Location: [facility] ORS;  Service: Orthopedics;  Laterality: Left;   TOTAL KNEE ARTHROPLASTY Left 06/13/2017   Procedure: TOTAL KNEE ARTHROPLASTY;  Surgeon: Thornton Park, MD;  Location: [facility] ORS;  Service: Orthopedics;  Laterality: Left;   TUBAL LIGATION     Patient Active Problem List   Diagnosis Date Noted   S/P total knee arthroplasty, left 06/13/2017   Bruising 03/25/2017   Pre-op evaluation 10/25/2016   Bilateral knee pain 11/01/2015   Thrombocytopenia ([place]) 11/01/2015   AP  (abdominal pain)    Diverticulitis large intestine 10/01/2015    ONSET DATE: Oct 2018  REFERRING DIAG: Frequent falls  THERAPY DIAG:  Repeated falls  Unsteadiness on feet  Rationale for Evaluation and Treatment: Rehabilitation  SUBJECTIVE:                                                                                                                                                                                             SUBJECTIVE STATEMENT: Pt reports doing well. No complaints  this date with regards to pain. No stumbles or falls. Had fun with the obstacle course last session.   PERTINENT HISTORY: Pt is a 67 year old female presenting following multiple falls over the past year. Pertinent history of L TKA Oct 2018 with extensive bout of PT (through April 2019) and testing following due to lack of predictable progress. EMG of LLE March 2020 demonstrating generalized severe sensorimotor polyneuropathy. Known imaging of MRI brain with contrast and cspine MRI unremarkable for current symptoms. Pt endorses 4 falls in the past 6 months, reporting they were all while walking some inside, some outside, reporting she has fallen forward and backward. Reports feeling more unsteady walking in the dark. Denies any severe injuries from falling, dizziness, LOC, visual changes. When asked on how falls are occurring reports "I just go down" not that she is tripping on anything or her own feet. Pt ambulates without AD. Has step over tub with grab bars. Pt lives in a one story home; with 4 steps without handrail, and reports she feels unsteady on this. No falls on step, but has tripped on curb. Lives at home with her husband, reports she walks holding his arm in the community to steady herself, but he provides no other physical assistance for her. Pt is retired, previously enjoyed road biking, and running prior to surgery. Reports she is unable to ply with her grandchildren due to fear of falling. She  completes weight lifting 3x/week 45min walking on treadmill, and full body weight lifting. Has a hard time getting in and out of the floor.  Pain in L knee ranges from 3/10 - 8/10; currently: 3/10. Knee pain is worsened by closed chain bending (leg press, eliptical, squatting), and cold weather. This pain is sharp and stabbing.   PAIN:  Are you having pain? Yes: NPRS scale: 3/10 Pain location: L knee Pain description: Sharp stabbing Aggravating factors: closed chain bending (leg press, eliptical, squatting), and cold weather Relieving factors: rest  PRECAUTIONS: Fall  WEIGHT BEARING RESTRICTIONS: No  FALLS: Has patient fallen in last 6 months? Yes. Number of falls 4   PATIENT GOALS: Decrease my falls  OBJECTIVE:    LOWER EXTREMITY MMT:    MMT Right Eval Left Eval  Hip flexion 4+ 4-  Hip extension 4 4-  Hip abduction 4+ 4+  Hip adduction    Hip internal rotation 5 5  Hip external rotation 5 4+  Knee flexion 4+ 3+   Knee extension 4+ 4+  Ankle dorsiflexion 4+ 3  Ankle plantarflexion SL heel raise 21 bilat  Ankle inversion    Ankle eversion    (Blank rows = not tested)     TODAY'S TREATMENT:                                                                                                                              DATE: 10/31/22  There.ex: 10% grade 2.5 mph x 4 minutes, then 2.5 at  6% x 2 minutes, 2% x 2 minutes   Obstacle course x4 Tandem on half foam > L foot on  rocker > R foot on large dina disc > R foot on half sphre > L foot on half sphere > tandem walk on foam  (12ft) > fwd > bckwd shuffle between 4 cones > 5 6in hurdle negotiation  Alt lateral jump with 2sec hold in SLS 2x 12 with occassional foot down for balance  On foam alt march with 7# DB unilaterally held overhead 2x 12 bilat   PATIENT EDUCATION: Education details: Patient was educated on diagnosis, anatomy and pathology involved, prognosis, role of PT, and was given an HEP, demonstrating exercise  with proper form following verbal and tactile cues, and was given a paper hand out to continue exercise at home. Pt was educated on and agreed to plan of care.  Person educated: Patient Education method: Explanation, Demonstration, and Handouts Education comprehension: verbalized understanding and returned demonstration  HOME EXERCISE PROGRAM: [id number]  GOALS: Goals reviewed with patient? Yes  SHORT TERM GOALS: Target date:   Pt will be independent with HEP in order to improve strength and balance in order to decrease fall risk and improve function at home and work.  Baseline: HEP given  Goal status: INITIAL   LONG TERM GOALS: Target date: 12/20/22  Patient will increase FOTO score to 54 to demonstrate predicted increase in functional mobility to complete ADLs  Baseline: 46 Goal status: INITIAL  2.  Pt will demonstrate a score of 22/30 or higher on FGA to demonstrate decreased fall risk  Baseline: 16/30 Goal status: INITIAL  3.  Pt will demonstrate SLS of 18sec each LLE to demonstrate age matched norms of static balance Baseline: R: 6sec L 2sec Goal status: INITIAL  4.  Pt will decrease worst pain as reported on NPRS by at least 3 points in order to demonstrate clinically significant reduction in pain.  Baseline: 6/10 L knee pain  Goal status: INITIAL  5.  Pt will decrease 5TSTS by at least 3 seconds in order to demonstrate clinically significant improvement in LE strength  Baseline: 19sec Goal status: INITIAL   ASSESSMENT:  CLINICAL IMPRESSION: Continuing PT POC with focus on strengthening and dynamic balance. Emphasis on unstable surfaces this date to challenge vestibular input due to pt's history with BLE peripheral neuropathy to further improve available balance systems to reduce general falls risk. Pt is challenged with SLS and unstable surfaces relying on hip righting reactions and stepping strategy to correct. She is however easily able to regain her LOB without  physical support. Pt will continue to benefit from skilled PT services to progress strength and balance to reduce falls risk and address remaining goals.    OBJECTIVE IMPAIRMENTS: Abnormal gait, cardiopulmonary status limiting activity, decreased activity tolerance, decreased balance, decreased coordination, decreased endurance, decreased mobility, difficulty walking, decreased ROM, decreased strength, increased fascial restrictions, impaired flexibility, impaired UE functional use, improper body mechanics, postural dysfunction, and pain.   ACTIVITY LIMITATIONS: carrying, lifting, bending, standing, stairs, transfers, and bathing  PARTICIPATION LIMITATIONS: meal prep, cleaning, laundry, shopping, community activity, and yard work  PERSONAL FACTORS: Age, Education, Past/current experiences, Time since onset of injury/illness/exacerbation, and 1-2 comorbidities: OA, chronic pain, LLE severe neuropathy  are also affecting patient's functional outcome.   REHAB POTENTIAL: Good  CLINICAL DECISION MAKING: Evolving/moderate complexity  EVALUATION COMPLEXITY: Moderate  PLAN:  PT FREQUENCY: 1-2x/week  PT DURATION: 8 weeks  PLANNED INTERVENTIONS: Therapeutic exercises, Therapeutic activity, Neuromuscular re-education, Balance  training, Gait training, Patient/Family education, Self Care, Joint mobilization, Joint manipulation, DME instructions, Dry Needling, Electrical stimulation, Spinal manipulation, Spinal mobilization, Cryotherapy, Moist heat, Traction, Ultrasound, Manual therapy, and Re-evaluation  PLAN FOR NEXT SESSION: plank test, ankle mobility assessment   Salem Caster. Fairly IV, PT, DPT Physical Therapist- [facility]  11/04/2022, 2:07 PM

## 2022-11-06 ENCOUNTER — Encounter: Payer: Self-pay | Admitting: Physical Therapy

## 2022-11-06 ENCOUNTER — Ambulatory Visit: Payer: PPO | Admitting: Physical Therapy

## 2022-11-06 DIAGNOSIS — R296 Repeated falls: Secondary | ICD-10-CM | POA: Diagnosis not present

## 2022-11-06 DIAGNOSIS — R2681 Unsteadiness on feet: Secondary | ICD-10-CM

## 2022-11-06 NOTE — Therapy (Signed)
OUTPATIENT PHYSICAL THERAPY TREATMENT   Patient Name: Rebecca Watkins MRN: [id number] DOB:1955/12/18, 67 y.o., female Today's Date: 11/06/2022   PCP: Tressia Miners MD  REFERRING PROVIDER: Manuella Ghazi MD  END OF SESSION:  PT End of Session - 11/06/22 1310     Visit Number 7    Number of Visits 17    Date for PT Re-Evaluation 12/20/22    Authorization Type [employer]    Authorization Time Period 10/16/22-12/20/22    Authorization - Visit Number 7    Authorization - Number of Visits 10    Progress Note Due on Visit 10    PT Start Time [id number]    PT Stop Time 1345    PT Time Calculation (min) 38 min    Equipment Utilized During Treatment Gait belt    Activity Tolerance Patient tolerated treatment well;No increased pain    Behavior During Therapy [facility] for tasks assessed/performed                Past Medical History:  Diagnosis Date   Arthritis    Osteoarthritis   Basal cell carcinoma 04/29/2013   left upper back   Diverticulitis 2017   PONV (postoperative nausea and vomiting)    Temporary low platelet count ([place])    H/O   Past Surgical History:  Procedure Laterality Date   COLONOSCOPY WITH PROPOFOL N/A 12/15/2015   Procedure: COLONOSCOPY WITH PROPOFOL;  Surgeon: Manya Silvas, MD;  Location: [facility] ENDOSCOPY;  Service: Endoscopy;  Laterality: N/A;   KNEE ARTHROSCOPY WITH MEDIAL MENISECTOMY Left 10/31/2016   Procedure: KNEE ARTHROSCOPY WITH MEDIAL MENISECTOMY;  Surgeon: Thornton Park, MD;  Location: [facility] ORS;  Service: Orthopedics;  Laterality: Left;   TOTAL KNEE ARTHROPLASTY Left 06/13/2017   Procedure: TOTAL KNEE ARTHROPLASTY;  Surgeon: Thornton Park, MD;  Location: [facility] ORS;  Service: Orthopedics;  Laterality: Left;   TUBAL LIGATION     Patient Active Problem List   Diagnosis Date Noted   S/P total knee arthroplasty, left 06/13/2017   Bruising 03/25/2017   Pre-op evaluation 10/25/2016   Bilateral knee pain 11/01/2015   Thrombocytopenia ([place]) 11/01/2015   AP  (abdominal pain)    Diverticulitis large intestine 10/01/2015    ONSET DATE: Oct 2018  REFERRING DIAG: Frequent falls  THERAPY DIAG:  Repeated falls  Unsteadiness on feet  Rationale for Evaluation and Treatment: Rehabilitation  SUBJECTIVE:                                                                                                                                                                                             SUBJECTIVE STATEMENT: Pt reports doing well. No  complaints this date with regards to pain. No stumbles or falls. Had fun with the obstacle course last session.   PERTINENT HISTORY: Pt is a 67 year old female presenting following multiple falls over the past year. Pertinent history of L TKA Oct 2018 with extensive bout of PT (through April 2019) and testing following due to lack of predictable progress. EMG of LLE March 2020 demonstrating generalized severe sensorimotor polyneuropathy. Known imaging of MRI brain with contrast and cspine MRI unremarkable for current symptoms. Pt endorses 4 falls in the past 6 months, reporting they were all while walking some inside, some outside, reporting she has fallen forward and backward. Reports feeling more unsteady walking in the dark. Denies any severe injuries from falling, dizziness, LOC, visual changes. When asked on how falls are occurring reports "I just go down" not that she is tripping on anything or her own feet. Pt ambulates without AD. Has step over tub with grab bars. Pt lives in a one story home; with 4 steps without handrail, and reports she feels unsteady on this. No falls on step, but has tripped on curb. Lives at home with her husband, reports she walks holding his arm in the community to steady herself, but he provides no other physical assistance for her. Pt is retired, previously enjoyed road biking, and running prior to surgery. Reports she is unable to ply with her grandchildren due to fear of falling. She  completes weight lifting 3x/week 58min walking on treadmill, and full body weight lifting. Has a hard time getting in and out of the floor.  Pain in L knee ranges from 3/10 - 8/10; currently: 3/10. Knee pain is worsened by closed chain bending (leg press, eliptical, squatting), and cold weather. This pain is sharp and stabbing.   PAIN:  Are you having pain? Yes: NPRS scale: 3/10 Pain location: L knee Pain description: Sharp stabbing Aggravating factors: closed chain bending (leg press, eliptical, squatting), and cold weather Relieving factors: rest  PRECAUTIONS: Fall  WEIGHT BEARING RESTRICTIONS: No  FALLS: Has patient fallen in last 6 months? Yes. Number of falls 4   PATIENT GOALS: Decrease my falls  OBJECTIVE:    LOWER EXTREMITY MMT:    MMT Right Eval Left Eval  Hip flexion 4+ 4-  Hip extension 4 4-  Hip abduction 4+ 4+  Hip adduction    Hip internal rotation 5 5  Hip external rotation 5 4+  Knee flexion 4+ 3+   Knee extension 4+ 4+  Ankle dorsiflexion 4+ 3  Ankle plantarflexion SL heel raise 21 bilat  Ankle inversion    Ankle eversion    (Blank rows = not tested)     TODAY'S TREATMENT:                                                                                                                              DATE: 10/31/22  There.ex: 10% grade 2.5 mph x 4 minutes, then 2.5  at 6% x 2 minutes, 2% x 2 minutes   Outdoor ambulation up/down mulch hill, concrete hill, handicap ramp, stairs, weaving through parking signs on/off concrete <> grass x79mins - 1 instance of LOB, pt needing to grab handrail descending stairs without handrail able to maintain balance with foot down and handrail  Step down from 6in step 2x 12 bilat without handrail with cuing for eccentric control- difficulty with L heel touch with RLE step down  Alt lateral jump with 2sec hold in SLS 2x 12 with occassional foot down for balance  Gastroc stretch on step 30secH   PATIENT  EDUCATION: Education details: Patient was educated on diagnosis, anatomy and pathology involved, prognosis, role of PT, and was given an HEP, demonstrating exercise with proper form following verbal and tactile cues, and was given a paper hand out to continue exercise at home. Pt was educated on and agreed to plan of care.  Person educated: Patient Education method: Explanation, Demonstration, and Handouts Education comprehension: verbalized understanding and returned demonstration  HOME EXERCISE PROGRAM: [id number]  GOALS: Goals reviewed with patient? Yes  SHORT TERM GOALS: Target date:   Pt will be independent with HEP in order to improve strength and balance in order to decrease fall risk and improve function at home and work.  Baseline: HEP given  Goal status: INITIAL   LONG TERM GOALS: Target date: 12/20/22  Patient will increase FOTO score to 54 to demonstrate predicted increase in functional mobility to complete ADLs  Baseline: 46 Goal status: INITIAL  2.  Pt will demonstrate a score of 22/30 or higher on FGA to demonstrate decreased fall risk  Baseline: 16/30 Goal status: INITIAL  3.  Pt will demonstrate SLS of 18sec each LLE to demonstrate age matched norms of static balance Baseline: R: 6sec L 2sec Goal status: INITIAL  4.  Pt will decrease worst pain as reported on NPRS by at least 3 points in order to demonstrate clinically significant reduction in pain.  Baseline: 6/10 L knee pain  Goal status: INITIAL  5.  Pt will decrease 5TSTS by at least 3 seconds in order to demonstrate clinically significant improvement in LE strength  Baseline: 19sec Goal status: INITIAL   ASSESSMENT:  CLINICAL IMPRESSION: Continuing PT POC with focus on strengthening and dynamic balance. Emphasis on community ambulation issues with outside environment. SBA for safety throughout session with 1 instance of unsteadiness on stair descent without UE support attempt. She is however easily  able to regain her LOB without physical support. Pt will continue to benefit from skilled PT services to progress strength and balance to reduce falls risk and address remaining goals.    OBJECTIVE IMPAIRMENTS: Abnormal gait, cardiopulmonary status limiting activity, decreased activity tolerance, decreased balance, decreased coordination, decreased endurance, decreased mobility, difficulty walking, decreased ROM, decreased strength, increased fascial restrictions, impaired flexibility, impaired UE functional use, improper body mechanics, postural dysfunction, and pain.   ACTIVITY LIMITATIONS: carrying, lifting, bending, standing, stairs, transfers, and bathing  PARTICIPATION LIMITATIONS: meal prep, cleaning, laundry, shopping, community activity, and yard work  PERSONAL FACTORS: Age, Education, Past/current experiences, Time since onset of injury/illness/exacerbation, and 1-2 comorbidities: OA, chronic pain, LLE severe neuropathy  are also affecting patient's functional outcome.   REHAB POTENTIAL: Good  CLINICAL DECISION MAKING: Evolving/moderate complexity  EVALUATION COMPLEXITY: Moderate  PLAN:  PT FREQUENCY: 1-2x/week  PT DURATION: 8 weeks  PLANNED INTERVENTIONS: Therapeutic exercises, Therapeutic activity, Neuromuscular re-education, Balance training, Gait training, Patient/Family education, Self Care, Joint mobilization, Joint manipulation, DME instructions, Dry  Needling, Electrical stimulation, Spinal manipulation, Spinal mobilization, Cryotherapy, Moist heat, Traction, Ultrasound, Manual therapy, and Re-evaluation  PLAN FOR NEXT SESSION: plank test, ankle mobility assessment   Salem Caster. Fairly IV, PT, DPT Physical Therapist- [facility]  11/06/2022, 1:51 PM

## 2022-11-11 ENCOUNTER — Encounter: Payer: Self-pay | Admitting: Physical Therapy

## 2022-11-11 ENCOUNTER — Ambulatory Visit: Payer: PPO | Admitting: Physical Therapy

## 2022-11-11 DIAGNOSIS — R296 Repeated falls: Secondary | ICD-10-CM | POA: Diagnosis not present

## 2022-11-11 DIAGNOSIS — R2681 Unsteadiness on feet: Secondary | ICD-10-CM

## 2022-11-11 NOTE — Therapy (Signed)
OUTPATIENT PHYSICAL THERAPY TREATMENT   Patient Name: Rebecca Watkins MRN: [id number] DOB:[birth year], 67 y.o., female Today's Date: 11/11/2022   PCP: Tressia Miners MD  REFERRING PROVIDER: Manuella Ghazi MD  END OF SESSION:  PT End of Session - 11/11/22 1132     Visit Number 8    Number of Visits 17    Date for PT Re-Evaluation 12/20/22    Authorization Type [employer]    Authorization Time Period 10/16/22-12/20/22    Authorization - Visit Number 8    Authorization - Number of Visits 10    Progress Note Due on Visit 10    PT Start Time 1130    PT Stop Time 1200    PT Time Calculation (min) 30 min    Activity Tolerance Patient tolerated treatment well;No increased pain    Behavior During Therapy [facility] for tasks assessed/performed                 Past Medical History:  Diagnosis Date   Arthritis    Osteoarthritis   Basal cell carcinoma 04/29/2013   left upper back   Diverticulitis 2017   PONV (postoperative nausea and vomiting)    Temporary low platelet count ([place])    H/O   Past Surgical History:  Procedure Laterality Date   COLONOSCOPY WITH PROPOFOL N/A 12/15/2015   Procedure: COLONOSCOPY WITH PROPOFOL;  Surgeon: Manya Silvas, MD;  Location: [facility] ENDOSCOPY;  Service: Endoscopy;  Laterality: N/A;   KNEE ARTHROSCOPY WITH MEDIAL MENISECTOMY Left 10/31/2016   Procedure: KNEE ARTHROSCOPY WITH MEDIAL MENISECTOMY;  Surgeon: Thornton Park, MD;  Location: [facility] ORS;  Service: Orthopedics;  Laterality: Left;   TOTAL KNEE ARTHROPLASTY Left 06/13/2017   Procedure: TOTAL KNEE ARTHROPLASTY;  Surgeon: Thornton Park, MD;  Location: [facility] ORS;  Service: Orthopedics;  Laterality: Left;   TUBAL LIGATION     Patient Active Problem List   Diagnosis Date Noted   S/P total knee arthroplasty, left 06/13/2017   Bruising 03/25/2017   Pre-op evaluation 10/25/2016   Bilateral knee pain 11/01/2015   Thrombocytopenia ([place]) 11/01/2015   AP (abdominal pain)    Diverticulitis large  intestine 10/01/2015    ONSET DATE: Oct 2018  REFERRING DIAG: Frequent falls  THERAPY DIAG:  Repeated falls  Unsteadiness on feet  Rationale for Evaluation and Treatment: Rehabilitation  SUBJECTIVE:                                                                                                                                                                                             SUBJECTIVE STATEMENT: Pt reports doing well. Reports neuropathic pain is increased today d/t weather. No  stumbles or falls.    PERTINENT HISTORY: Pt is a 67 year old female presenting following multiple falls over the past year. Pertinent history of L TKA Oct 2018 with extensive bout of PT (through April 2019) and testing following due to lack of predictable progress. EMG of LLE March 2020 demonstrating generalized severe sensorimotor polyneuropathy. Known imaging of MRI brain with contrast and cspine MRI unremarkable for current symptoms. Pt endorses 4 falls in the past 6 months, reporting they were all while walking some inside, some outside, reporting she has fallen forward and backward. Reports feeling more unsteady walking in the dark. Denies any severe injuries from falling, dizziness, LOC, visual changes. When asked on how falls are occurring reports "I just go down" not that she is tripping on anything or her own feet. Pt ambulates without AD. Has step over tub with grab bars. Pt lives in a one story home; with 4 steps without handrail, and reports she feels unsteady on this. No falls on step, but has tripped on curb. Lives at home with her husband, reports she walks holding his arm in the community to steady herself, but he provides no other physical assistance for her. Pt is retired, previously enjoyed road biking, and running prior to surgery. Reports she is unable to ply with her grandchildren due to fear of falling. She completes weight lifting 3x/week 88min walking on treadmill, and full body weight  lifting. Has a hard time getting in and out of the floor.  Pain in L knee ranges from 3/10 - 8/10; currently: 3/10. Knee pain is worsened by closed chain bending (leg press, eliptical, squatting), and cold weather. This pain is sharp and stabbing.   PAIN:  Are you having pain? Yes: NPRS scale: 3/10 Pain location: L knee Pain description: Sharp stabbing Aggravating factors: closed chain bending (leg press, eliptical, squatting), and cold weather Relieving factors: rest  PRECAUTIONS: Fall  WEIGHT BEARING RESTRICTIONS: No  FALLS: Has patient fallen in last 6 months? Yes. Number of falls 4   PATIENT GOALS: Decrease my falls  OBJECTIVE:    LOWER EXTREMITY MMT:    MMT Right Eval Left Eval  Hip flexion 4+ 4-  Hip extension 4 4-  Hip abduction 4+ 4+  Hip adduction    Hip internal rotation 5 5  Hip external rotation 5 4+  Knee flexion 4+ 3+   Knee extension 4+ 4+  Ankle dorsiflexion 4+ 3  Ankle plantarflexion SL heel raise 21 bilat  Ankle inversion    Ankle eversion    (Blank rows = not tested)     TODAY'S TREATMENT:                                                                                                                              DATE: 10/31/22  There.ex: 10% grade 2.5 mph x 4 minutes, then 2.5 at 6% x 2 minutes, 2% x 2 minutes   5xSTS x2 trials 13sec  SLS x2 trials bilat FGA with SBA for safety  PT reviewed the following HEP with patient with patient able to demonstrate a set of the following with min cuing for correction needed. PT educated patient on parameters of therex (how/when to inc/decrease intensity, frequency, rep/set range, stretch hold time, and purpose of therex) with verbalized understanding.  Access Code: [email or web address] - Squat with Chair Touch  - 2-3 x weekly - 2-3 sets - 8-12 reps - Lateral Lunge  - 2-3 x weekly - 2-3 sets - 8-12 reps - Single Leg Stance  - 1 x daily - 7 x weekly - 60sec hold   PATIENT EDUCATION: Education details: Patient was  educated on diagnosis, anatomy and pathology involved, prognosis, role of PT, and was given an HEP, demonstrating exercise with proper form following verbal and tactile cues, and was given a paper hand out to continue exercise at home. Pt was educated on and agreed to plan of care.  Person educated: Patient Education method: Explanation, Demonstration, and Handouts Education comprehension: verbalized understanding and returned demonstration  HOME EXERCISE PROGRAM: [id number]  GOALS: Goals reviewed with patient? Yes  SHORT TERM GOALS: Target date:   Pt will be independent with HEP in order to improve strength and balance in order to decrease fall risk and improve function at home and work.  Baseline: HEP given  Goal status: INITIAL   LONG TERM GOALS: Target date: 12/20/22  Patient will increase FOTO score to 54 to demonstrate predicted increase in functional mobility to complete ADLs  Baseline: 46; 11/11/22 50 Goal status: MET  2.  Pt will demonstrate a score of 22/30 or higher on FGA to demonstrate decreased fall risk  Baseline: 16/30; 11/11/22 27/30 Goal status: MET  3.  Pt will demonstrate SLS of 18sec each LLE to demonstrate age matched norms of static balance Baseline: R: 6sec L 2sec 11/11/22 R: ceased at 37min; L 37sec Goal status: MET  4.  Pt will decrease worst pain as reported on NPRS by at least 3 points in order to demonstrate clinically significant reduction in pain.  Baseline: 6/10 L knee pain ; 11/11/22 8/10 Goal status: NOT MET  5.  Pt will decrease 5TSTS by at least 3 seconds in order to demonstrate clinically significant improvement in LE strength  Baseline: 19sec; 11/11/22 13sec Goal status: MET   ASSESSMENT:  CLINICAL IMPRESSION: PT reassessed goals this session where patient has met all goals to safely d/c formal PT. Patient is able to demonstrate and verbalize understanding of all HEP recommendations with minimal corrections needed. Pt given clinic contact  info should further questions or concerns arise. Pt to d/c PT.     OBJECTIVE IMPAIRMENTS: Abnormal gait, cardiopulmonary status limiting activity, decreased activity tolerance, decreased balance, decreased coordination, decreased endurance, decreased mobility, difficulty walking, decreased ROM, decreased strength, increased fascial restrictions, impaired flexibility, impaired UE functional use, improper body mechanics, postural dysfunction, and pain.   ACTIVITY LIMITATIONS: carrying, lifting, bending, standing, stairs, transfers, and bathing  PARTICIPATION LIMITATIONS: meal prep, cleaning, laundry, shopping, community activity, and yard work  PERSONAL FACTORS: Age, Education, Past/current experiences, Time since onset of injury/illness/exacerbation, and 1-2 comorbidities: OA, chronic pain, LLE severe neuropathy  are also affecting patient's functional outcome.   REHAB POTENTIAL: Good  CLINICAL DECISION MAKING: Evolving/moderate complexity  EVALUATION COMPLEXITY: Moderate  PLAN:  PT FREQUENCY: 1-2x/week  PT DURATION: 8 weeks  PLANNED INTERVENTIONS: Therapeutic exercises, Therapeutic activity, Neuromuscular re-education, Balance training, Gait training, Patient/Family education, Self Care, Joint mobilization, Joint manipulation, DME instructions,  Dry Needling, Electrical stimulation, Spinal manipulation, Spinal mobilization, Cryotherapy, Moist heat, Traction, Ultrasound, Manual therapy, and Re-evaluation  PLAN FOR NEXT SESSION: plank test, ankle mobility assessment   Salem Caster. Fairly IV, PT, DPT Physical Therapist- [facility]  11/11/2022, 12:06 PM

## 2022-11-13 ENCOUNTER — Other Ambulatory Visit: Payer: Self-pay

## 2022-11-13 ENCOUNTER — Ambulatory Visit: Payer: PPO | Admitting: Physical Therapy

## 2022-11-14 ENCOUNTER — Other Ambulatory Visit: Payer: Self-pay

## 2022-11-15 ENCOUNTER — Other Ambulatory Visit: Payer: Self-pay

## 2022-11-18 ENCOUNTER — Other Ambulatory Visit: Payer: Self-pay

## 2022-11-19 ENCOUNTER — Other Ambulatory Visit: Payer: Self-pay

## 2022-11-19 MED ORDER — GABAPENTIN 300 MG PO CAPS
300.0000 mg | ORAL_CAPSULE | Freq: Every evening | ORAL | 3 refills | Status: DC
  Filled 2022-11-19: qty 90, 90d supply, fill #0
  Filled 2023-02-12: qty 90, 90d supply, fill #1
  Filled 2023-05-20: qty 90, 90d supply, fill #2
  Filled 2023-08-22: qty 90, 90d supply, fill #3

## 2022-11-25 ENCOUNTER — Other Ambulatory Visit: Payer: Self-pay

## 2022-11-25 DIAGNOSIS — Z03818 Encounter for observation for suspected exposure to other biological agents ruled out: Secondary | ICD-10-CM | POA: Diagnosis not present

## 2022-11-25 DIAGNOSIS — J40 Bronchitis, not specified as acute or chronic: Secondary | ICD-10-CM | POA: Diagnosis not present

## 2022-11-25 DIAGNOSIS — R059 Cough, unspecified: Secondary | ICD-10-CM | POA: Diagnosis not present

## 2022-11-25 DIAGNOSIS — M791 Myalgia, unspecified site: Secondary | ICD-10-CM | POA: Diagnosis not present

## 2022-11-25 DIAGNOSIS — R051 Acute cough: Secondary | ICD-10-CM | POA: Diagnosis not present

## 2022-11-25 DIAGNOSIS — J439 Emphysema, unspecified: Secondary | ICD-10-CM | POA: Diagnosis not present

## 2022-11-25 DIAGNOSIS — R6883 Chills (without fever): Secondary | ICD-10-CM | POA: Diagnosis not present

## 2022-11-25 MED ORDER — BENZONATATE 200 MG PO CAPS
ORAL_CAPSULE | ORAL | 0 refills | Status: AC
  Filled 2022-11-25: qty 21, 7d supply, fill #0

## 2022-11-25 MED ORDER — DOXYCYCLINE HYCLATE 100 MG PO CAPS
100.0000 mg | ORAL_CAPSULE | Freq: Two times a day (BID) | ORAL | 0 refills | Status: AC
  Filled 2022-11-25: qty 14, 7d supply, fill #0

## 2022-11-25 MED ORDER — PREDNISONE 10 MG PO TABS
10.0000 mg | ORAL_TABLET | Freq: Every day | ORAL | 0 refills | Status: AC
  Filled 2022-11-25: qty 5, 5d supply, fill #0

## 2022-12-30 ENCOUNTER — Other Ambulatory Visit: Payer: Self-pay

## 2023-02-26 ENCOUNTER — Other Ambulatory Visit: Payer: Self-pay

## 2023-03-18 ENCOUNTER — Other Ambulatory Visit: Payer: Self-pay

## 2023-03-18 MED ORDER — CLOBETASOL PROPIONATE 0.05 % EX CREA
TOPICAL_CREAM | CUTANEOUS | 1 refills | Status: AC
  Filled 2023-03-18: qty 30, 90d supply, fill #0
  Filled 2023-11-13: qty 30, 90d supply, fill #1

## 2023-04-23 DIAGNOSIS — E559 Vitamin D deficiency, unspecified: Secondary | ICD-10-CM | POA: Diagnosis not present

## 2023-04-23 DIAGNOSIS — M810 Age-related osteoporosis without current pathological fracture: Secondary | ICD-10-CM | POA: Diagnosis not present

## 2023-04-23 DIAGNOSIS — G63 Polyneuropathy in diseases classified elsewhere: Secondary | ICD-10-CM | POA: Diagnosis not present

## 2023-04-23 DIAGNOSIS — E538 Deficiency of other specified B group vitamins: Secondary | ICD-10-CM | POA: Diagnosis not present

## 2023-04-23 DIAGNOSIS — M1990 Unspecified osteoarthritis, unspecified site: Secondary | ICD-10-CM | POA: Diagnosis not present

## 2023-04-23 DIAGNOSIS — N951 Menopausal and female climacteric states: Secondary | ICD-10-CM | POA: Diagnosis not present

## 2023-04-28 ENCOUNTER — Other Ambulatory Visit: Payer: Self-pay

## 2023-04-28 DIAGNOSIS — D696 Thrombocytopenia, unspecified: Secondary | ICD-10-CM | POA: Diagnosis not present

## 2023-04-28 DIAGNOSIS — Z23 Encounter for immunization: Secondary | ICD-10-CM | POA: Diagnosis not present

## 2023-04-28 DIAGNOSIS — G629 Polyneuropathy, unspecified: Secondary | ICD-10-CM | POA: Diagnosis not present

## 2023-04-28 DIAGNOSIS — R296 Repeated falls: Secondary | ICD-10-CM | POA: Diagnosis not present

## 2023-04-30 ENCOUNTER — Other Ambulatory Visit: Payer: Self-pay

## 2023-04-30 MED ORDER — AMOXICILLIN 875 MG PO TABS
875.0000 mg | ORAL_TABLET | Freq: Two times a day (BID) | ORAL | 0 refills | Status: DC
  Filled 2023-04-30: qty 14, 7d supply, fill #0

## 2023-04-30 MED ORDER — IBUPROFEN 800 MG PO TABS
800.0000 mg | ORAL_TABLET | Freq: Three times a day (TID) | ORAL | 0 refills | Status: AC | PRN
  Filled 2023-04-30: qty 18, 6d supply, fill #0

## 2023-04-30 MED ORDER — DEXAMETHASONE 4 MG PO TABS
ORAL_TABLET | ORAL | 0 refills | Status: DC
  Filled 2023-04-30: qty 7, 5d supply, fill #0

## 2023-06-17 ENCOUNTER — Other Ambulatory Visit: Payer: Self-pay

## 2023-07-15 ENCOUNTER — Other Ambulatory Visit: Payer: Self-pay

## 2023-07-15 MED ORDER — AMOXICILLIN 875 MG PO TABS
875.0000 mg | ORAL_TABLET | Freq: Two times a day (BID) | ORAL | 0 refills | Status: AC
  Filled 2023-07-15: qty 14, 7d supply, fill #0

## 2023-07-15 MED ORDER — IBUPROFEN 800 MG PO TABS
800.0000 mg | ORAL_TABLET | Freq: Three times a day (TID) | ORAL | 0 refills | Status: AC | PRN
  Filled 2023-07-15: qty 18, 6d supply, fill #0

## 2023-07-15 MED ORDER — DEXAMETHASONE 4 MG PO TABS
ORAL_TABLET | ORAL | 0 refills | Status: AC
  Filled 2023-07-15: qty 7, 5d supply, fill #0

## 2023-07-18 ENCOUNTER — Other Ambulatory Visit: Payer: Self-pay

## 2023-07-25 ENCOUNTER — Other Ambulatory Visit: Payer: Self-pay

## 2023-07-25 DIAGNOSIS — Z124 Encounter for screening for malignant neoplasm of cervix: Secondary | ICD-10-CM | POA: Diagnosis not present

## 2023-07-25 MED ORDER — PREMPRO 0.45-1.5 MG PO TABS
1.0000 | ORAL_TABLET | Freq: Every day | ORAL | 11 refills | Status: DC
  Filled 2023-07-25: qty 30, 30d supply, fill #0
  Filled 2023-07-25 – 2023-08-22 (×2): qty 28, 28d supply, fill #0
  Filled 2023-11-13: qty 28, 28d supply, fill #1
  Filled 2024-01-06: qty 28, 28d supply, fill #2
  Filled 2024-02-18: qty 28, 28d supply, fill #3
  Filled 2024-05-02: qty 28, 28d supply, fill #4

## 2023-07-25 MED ORDER — ESTRADIOL 0.1 MG/GM VA CREA
0.5000 g | TOPICAL_CREAM | VAGINAL | 3 refills | Status: DC
  Filled 2023-07-25 (×2): qty 42.5, 90d supply, fill #0

## 2023-07-31 DIAGNOSIS — Z780 Asymptomatic menopausal state: Secondary | ICD-10-CM | POA: Diagnosis not present

## 2023-07-31 DIAGNOSIS — G629 Polyneuropathy, unspecified: Secondary | ICD-10-CM | POA: Diagnosis not present

## 2023-07-31 DIAGNOSIS — M85859 Other specified disorders of bone density and structure, unspecified thigh: Secondary | ICD-10-CM | POA: Diagnosis not present

## 2023-07-31 DIAGNOSIS — D696 Thrombocytopenia, unspecified: Secondary | ICD-10-CM | POA: Diagnosis not present

## 2023-07-31 DIAGNOSIS — R739 Hyperglycemia, unspecified: Secondary | ICD-10-CM | POA: Diagnosis not present

## 2023-07-31 DIAGNOSIS — Z0000 Encounter for general adult medical examination without abnormal findings: Secondary | ICD-10-CM | POA: Diagnosis not present

## 2023-07-31 DIAGNOSIS — Z79899 Other long term (current) drug therapy: Secondary | ICD-10-CM | POA: Diagnosis not present

## 2023-08-05 ENCOUNTER — Other Ambulatory Visit: Payer: Self-pay

## 2023-08-22 ENCOUNTER — Other Ambulatory Visit: Payer: Self-pay

## 2023-08-29 ENCOUNTER — Other Ambulatory Visit: Payer: Self-pay | Admitting: Obstetrics and Gynecology

## 2023-08-29 DIAGNOSIS — Z1231 Encounter for screening mammogram for malignant neoplasm of breast: Secondary | ICD-10-CM

## 2023-09-26 ENCOUNTER — Other Ambulatory Visit: Payer: Self-pay

## 2023-10-15 ENCOUNTER — Ambulatory Visit
Admission: RE | Admit: 2023-10-15 | Discharge: 2023-10-15 | Disposition: A | Discharge status: Home / Self Care | Payer: PPO | Source: Ambulatory Visit | Attending: Obstetrics and Gynecology | Admitting: Obstetrics and Gynecology

## 2023-10-15 DIAGNOSIS — Z1231 Encounter for screening mammogram for malignant neoplasm of breast: Secondary | ICD-10-CM | POA: Insufficient documentation

## 2023-10-20 ENCOUNTER — Other Ambulatory Visit: Payer: Self-pay | Admitting: Obstetrics and Gynecology

## 2023-10-20 DIAGNOSIS — R928 Other abnormal and inconclusive findings on diagnostic imaging of breast: Secondary | ICD-10-CM

## 2023-10-23 ENCOUNTER — Ambulatory Visit
Admission: RE | Admit: 2023-10-23 | Discharge: 2023-10-23 | Disposition: A | Discharge status: Home / Self Care | Source: Ambulatory Visit | Attending: Obstetrics and Gynecology | Admitting: Obstetrics and Gynecology

## 2023-10-23 DIAGNOSIS — R928 Other abnormal and inconclusive findings on diagnostic imaging of breast: Secondary | ICD-10-CM | POA: Diagnosis not present

## 2023-10-23 DIAGNOSIS — R92321 Mammographic fibroglandular density, right breast: Secondary | ICD-10-CM | POA: Diagnosis not present

## 2023-10-29 ENCOUNTER — Ambulatory Visit: Payer: PPO | Admitting: Dermatology

## 2023-10-29 ENCOUNTER — Encounter: Payer: Self-pay | Admitting: Dermatology

## 2023-10-29 DIAGNOSIS — C4491 Basal cell carcinoma of skin, unspecified: Secondary | ICD-10-CM

## 2023-10-29 DIAGNOSIS — L578 Other skin changes due to chronic exposure to nonionizing radiation: Secondary | ICD-10-CM | POA: Diagnosis not present

## 2023-10-29 DIAGNOSIS — L814 Other melanin hyperpigmentation: Secondary | ICD-10-CM | POA: Diagnosis not present

## 2023-10-29 DIAGNOSIS — L821 Other seborrheic keratosis: Secondary | ICD-10-CM | POA: Diagnosis not present

## 2023-10-29 DIAGNOSIS — Z85828 Personal history of other malignant neoplasm of skin: Secondary | ICD-10-CM | POA: Diagnosis not present

## 2023-10-29 DIAGNOSIS — D229 Melanocytic nevi, unspecified: Secondary | ICD-10-CM

## 2023-10-29 DIAGNOSIS — L853 Xerosis cutis: Secondary | ICD-10-CM | POA: Diagnosis not present

## 2023-10-29 DIAGNOSIS — C44319 Basal cell carcinoma of skin of other parts of face: Secondary | ICD-10-CM | POA: Diagnosis not present

## 2023-10-29 DIAGNOSIS — W908XXA Exposure to other nonionizing radiation, initial encounter: Secondary | ICD-10-CM

## 2023-10-29 DIAGNOSIS — D492 Neoplasm of unspecified behavior of bone, soft tissue, and skin: Secondary | ICD-10-CM | POA: Diagnosis not present

## 2023-10-29 DIAGNOSIS — Z1283 Encounter for screening for malignant neoplasm of skin: Secondary | ICD-10-CM

## 2023-10-29 DIAGNOSIS — Z872 Personal history of diseases of the skin and subcutaneous tissue: Secondary | ICD-10-CM

## 2023-10-29 DIAGNOSIS — L820 Inflamed seborrheic keratosis: Secondary | ICD-10-CM

## 2023-10-29 DIAGNOSIS — D1801 Hemangioma of skin and subcutaneous tissue: Secondary | ICD-10-CM

## 2023-10-29 DIAGNOSIS — D489 Neoplasm of uncertain behavior, unspecified: Secondary | ICD-10-CM

## 2023-10-29 HISTORY — DX: Basal cell carcinoma of skin, unspecified: C44.91

## 2023-10-29 NOTE — Patient Instructions (Addendum)
 Electrodesiccation and Curettage ("Scrape and Burn") Wound Care Instructions  Leave the original bandage on for 24 hours if possible.  If the bandage becomes soaked or soiled before that time, it is OK to remove it and examine the wound.  A small amount of post-operative bleeding is normal.  If excessive bleeding occurs, remove the bandage, place gauze over the site and apply continuous pressure (no peeking) over the area for 30 minutes. If this does not work, please call our clinic as soon as possible or page your doctor if it is after hours.   Once a day, cleanse the wound with soap and water. It is fine to shower. If a thick crust develops you may use a Q-tip dipped into dilute hydrogen peroxide (mix 1:1 with water) to dissolve it.  Hydrogen peroxide can slow the healing process, so use it only as needed.    After washing, apply petroleum jelly (Vaseline) or an antibiotic ointment if your doctor prescribed one for you, followed by a bandage.    For best healing, the wound should be covered with a layer of ointment at all times. If you are not able to keep the area covered with a bandage to hold the ointment in place, this may mean re-applying the ointment several times a day.  Continue this wound care until the wound has healed and is no longer open. It may take several weeks for the wound to heal and close.  Itching and mild discomfort is normal during the healing process.  If you have any discomfort, you can take Tylenol (acetaminophen) or ibuprofen as directed on the bottle. (Please do not take these if you have an allergy to them or cannot take them for another reason).  Some redness, tenderness and white or yellow material in the wound is normal healing.  If the area becomes very sore and red, or develops a thick yellow-green material (pus), it may be infected; please notify us.    Wound healing continues for up to one year following surgery. It is not unusual to experience pain in the  scar from time to time during the interval.  If the pain becomes severe or the scar thickens, you should notify the office.    A slight amount of redness in a scar is expected for the first six months.  After six months, the redness will fade and the scar will soften and fade.  The color difference becomes less noticeable with time.  If there are any problems, return for a post-op surgery check at your earliest convenience.  To improve the appearance of the scar, you can use silicone scar gel, cream, or sheets (such as Mederma or Serica) every night for up to one year. These are available over the counter (without a prescription).  Please call our office at [phone] for any questions or concerns.     Biopsy Wound Care Instructions  Leave the original bandage on for 24 hours if possible.  If the bandage becomes soaked or soiled before that time, it is OK to remove it and examine the wound.  A small amount of post-operative bleeding is normal.  If excessive bleeding occurs, remove the bandage, place gauze over the site and apply continuous pressure (no peeking) over the area for 30 minutes. If this does not work, please call our clinic as soon as possible or page your doctor if it is after hours.   Once a day, cleanse the wound with soap and water. It is  fine to shower. If a thick crust develops you may use a Q-tip dipped into dilute hydrogen peroxide (mix 1:1 with water) to dissolve it.  Hydrogen peroxide can slow the healing process, so use it only as needed.    After washing, apply petroleum jelly (Vaseline) or an antibiotic ointment if your doctor prescribed one for you, followed by a bandage.    For best healing, the wound should be covered with a layer of ointment at all times. If you are not able to keep the area covered with a bandage to hold the ointment in place, this may mean re-applying the ointment several times a day.  Continue this wound care until the wound has healed and is no  longer open.   Itching and mild discomfort is normal during the healing process. However, if you develop pain or severe itching, please call our office.   If you have any discomfort, you can take Tylenol (acetaminophen) or ibuprofen as directed on the bottle. (Please do not take these if you have an allergy to them or cannot take them for another reason).  Some redness, tenderness and white or yellow material in the wound is normal healing.  If the area becomes very sore and red, or develops a thick yellow-green material (pus), it may be infected; please notify us.    If you have stitches, return to clinic as directed to have the stitches removed. You will continue wound care for 2-3 days after the stitches are removed.   Wound healing continues for up to one year following surgery. It is not unusual to experience pain in the scar from time to time during the interval.  If the pain becomes severe or the scar thickens, you should notify the office.    A slight amount of redness in a scar is expected for the first six months.  After six months, the redness will fade and the scar will soften and fade.  The color difference becomes less noticeable with time.  If there are any problems, return for a post-op surgery check at your earliest convenience.  To improve the appearance of the scar, you can use silicone scar gel, cream, or sheets (such as Mederma or Serica) every night for up to one year. These are available over the counter (without a prescription).  Please call our office at [phone] for any questions or concerns.       If spot has not resolved on left forearm and left forehead  in 2 months call us and we will recheck    Recommend Cerave moisturizer cream as a daily moisturizer (samples given)    Gentle Skin Care Guide  1. Bathe no more than once a day.  2. Avoid bathing in hot water  3. Use a mild soap like Dove, Vanicream, Cetaphil, CeraVe. Can use Lever 2000 or Cetaphil  antibacterial soap  4. Use soap only where you need it. On most days, use it under your arms, between your legs, and on your feet. Let the water rinse other areas unless visibly dirty.  5. When you get out of the bath/shower, use a towel to gently blot your skin dry, don't rub it.  6. While your skin is still a little damp, apply a moisturizing cream such as Vanicream, CeraVe, Cetaphil, Eucerin, Sarna lotion or plain Vaseline Jelly. For hands apply Neutrogena [place] Hand Cream or Excipial Hand Cream.  7. Reapply moisturizer any time you start to itch or feel dry.  8. Sometimes using  free and clear laundry detergents can be helpful. Fabric softener sheets should be avoided. Downy Free & Gentle liquid, or any liquid fabric softener that is free of dyes and perfumes, it acceptable to use  9. If your doctor has given you prescription creams you may apply moisturizers over them       Seborrheic Keratosis  What causes seborrheic keratoses? Seborrheic keratoses are harmless, common skin growths that first appear during adult life.  As time goes by, more growths appear.  Some people may develop a large number of them.  Seborrheic keratoses appear on both covered and uncovered body parts.  They are not caused by sunlight.  The tendency to develop seborrheic keratoses can be inherited.  They vary in color from skin-colored to gray, brown, or even black.  They can be either smooth or have a rough, warty surface.   Seborrheic keratoses are superficial and look as if they were stuck on the skin.  Under the microscope this type of keratosis looks like layers upon layers of skin.  That is why at times the top layer may seem to fall off, but the rest of the growth remains and re-grows.    Treatment Seborrheic keratoses do not need to be treated, but can easily be removed in the office.  Seborrheic keratoses often cause symptoms when they rub on clothing or jewelry.  Lesions can be in the way of shaving.   If they become inflamed, they can cause itching, soreness, or burning.  Removal of a seborrheic keratosis can be accomplished by freezing, burning, or surgery. If any spot bleeds, scabs, or grows rapidly, please return to have it checked, as these can be an indication of a skin cancer.   Melanoma ABCDEs  Melanoma is the most dangerous type of skin cancer, and is the leading cause of death from skin disease.  You are more likely to develop melanoma if you: Have light-colored skin, light-colored eyes, or red or blond hair Spend a lot of time in the sun Tan regularly, either outdoors or in a tanning bed Have had blistering sunburns, especially during childhood Have a close family member who has had a melanoma Have atypical moles or large birthmarks  Early detection of melanoma is key since treatment is typically straightforward and cure rates are extremely high if we catch it early.   The first sign of melanoma is often a change in a mole or a new dark spot.  The ABCDE system is a way of remembering the signs of melanoma.  A for asymmetry:  The two halves do not match. B for border:  The edges of the growth are irregular. C for color:  A mixture of colors are present instead of an even brown color. D for diameter:  Melanomas are usually (but not always) greater than 6mm - the size of a pencil eraser. E for evolution:  The spot keeps changing in size, shape, and color.  Please check your skin once per month between visits. You can use a small mirror in front and a large mirror behind you to keep an eye on the back side or your body.   If you see any new or changing lesions before your next follow-up, please call to schedule a visit.  Please continue daily skin protection including broad spectrum sunscreen SPF 30+ to sun-exposed areas, reapplying every 2 hours as needed when you're outdoors.   Staying in the shade or wearing long sleeves, sun glasses (UVA+UVB protection) and wide  brim hats  (4-inch brim around the entire circumference of the hat) are also recommended for sun protection.    Due to recent changes in healthcare laws, you may see results of your pathology and/or laboratory studies on MyChart before the doctors have had a chance to review them. We understand that in some cases there may be results that are confusing or concerning to you. Please understand that not all results are received at the same time and often the doctors may need to interpret multiple results in order to provide you with the best plan of care or course of treatment. Therefore, we ask that you please give [place] 2 business days to thoroughly review all your results before contacting the office for clarification. Should we see a critical lab result, you will be contacted sooner.   If You Need Anything After Your Visit  If you have any questions or concerns for your doctor, please call our main line at [phone] and press option 4 to reach your doctor's medical assistant. If no one answers, please leave a voicemail as directed and we will return your call as soon as possible. Messages left after 4 pm will be answered the following business day.   You may also send [place] a message via MyChart. We typically respond to MyChart messages within 1-2 business days.  For prescription refills, please ask your pharmacy to contact our office. Our fax number is [phone].  If you have an urgent issue when the clinic is closed that cannot wait until the next business day, you can page your doctor at the number below.    Please note that while we do our best to be available for urgent issues outside of office hours, we are not available 24/7.   If you have an urgent issue and are unable to reach [place], you may choose to seek medical care at your doctor's office, retail clinic, urgent care center, or emergency room.  If you have a medical emergency, please immediately call 911 or go to the emergency department.  Pager  Numbers  - Dr. Gwen Pounds: [phone]  - Dr. Roseanne Reno: [phone]  - Dr. Katrinka Blazing: [phone]   In the event of inclement weather, please call our main line at [phone] for an update on the status of any delays or closures.  Dermatology Medication Tips: Please keep the boxes that topical medications come in in order to help keep track of the instructions about where and how to use these. Pharmacies typically print the medication instructions only on the boxes and not directly on the medication tubes.   If your medication is too expensive, please contact our office at [phone] option 4 or send [place] a message through MyChart.   We are unable to tell what your co-pay for medications will be in advance as this is different depending on your insurance coverage. However, we may be able to find a substitute medication at lower cost or fill out paperwork to get insurance to cover a needed medication.   If a prior authorization is required to get your medication covered by your insurance company, please allow [place] 1-2 business days to complete this process.  Drug prices often vary depending on where the prescription is filled and some pharmacies may offer cheaper prices.  The website www.goodrx.com contains coupons for medications through different pharmacies. The prices here do not account for what the cost may be with help from insurance (it may be cheaper with your insurance), but the website can give  you the price if you did not use any insurance.  - You can print the associated coupon and take it with your prescription to the pharmacy.  - You may also stop by our office during regular business hours and pick up a GoodRx coupon card.  - If you need your prescription sent electronically to a different pharmacy, notify our office through [facility] or by phone at [phone] option 4.     Si Usted Necesita Algo Despus de Su Visita  Tambin puede enviarnos un mensaje a travs de  Clinical cytogeneticist. Por lo general respondemos a los mensajes de MyChart en el transcurso de 1 a 2 das hbiles.  Para renovar recetas, por favor pida a su farmacia que se ponga en contacto con nuestra oficina. Annie Sable de fax es [place] [phone].  Si tiene un asunto urgente cuando la clnica est cerrada y que no puede esperar hasta el siguiente da hbil, puede llamar/localizar a su doctor(a) al nmero que aparece a continuacin.   Por favor, tenga en cuenta que aunque hacemos todo lo posible para estar disponibles para asuntos urgentes fuera del horario de [place], no estamos disponibles las 24 horas del da, los 7 [address]  [address] de la [place].   Si tiene un problema urgente y no puede comunicarse con nosotros, puede optar por buscar atencin mdica  en el consultorio de su doctor(a), en una clnica privada, en un centro de atencin urgente o en una sala de emergencias.  Si tiene Engineer, drilling, por favor llame inmediatamente al 911 o vaya a la sala de emergencias.  Nmeros de bper  - Dr. Gwen Pounds: [phone]  - Dra. Roseanne Reno: [phone]  - Dr. Katrinka Blazing: [phone]   En caso de inclemencias del tiempo, por favor llame a Lacy Duverney principal al [phone] para una actualizacin sobre el [place] de cualquier retraso o cierre.  Consejos para la medicacin en dermatologa: Por favor, guarde las cajas en las que vienen los medicamentos de uso tpico para ayudarle a seguir las instrucciones sobre dnde y cmo usarlos. Las farmacias generalmente imprimen las instrucciones del medicamento slo en las cajas y no directamente en los tubos del [place].   Si su medicamento es muy caro, por favor, pngase en contacto con Rolm Gala llamando al [phone] y presione la opcin 4 o envenos un mensaje a travs de Clinical cytogeneticist.   No podemos decirle cul ser su copago por los medicamentos por adelantado ya que esto es diferente dependiendo de la cobertura de su seguro. Sin embargo, es posible que  podamos encontrar un medicamento sustituto a Audiological scientist un formulario para que el seguro cubra el medicamento que se considera necesario.   Si se requiere una autorizacin previa para que su compaa de seguros [place] su medicamento, por favor permtanos de 1 a 2 das hbiles para completar [address]  Los precios de los medicamentos varan con frecuencia dependiendo del Environmental consultant de dnde se surte la receta y alguna farmacias pueden ofrecer precios ms baratos.  El sitio web www.goodrx.com tiene cupones para medicamentos de Health and safety inspector. Los precios aqu no tienen en cuenta lo que podra costar con la ayuda del seguro (puede ser ms barato con su seguro), pero el sitio web puede darle el precio si no utiliz Tourist information centre manager.  - Puede imprimir el cupn correspondiente y llevarlo con su receta a la farmacia.  - Tambin puede pasar por nuestra oficina durante el horario de atencin regular y Education officer, museum una tarjeta de cupones de GoodRx.  -  Si necesita que su receta se enve electrnicamente a Psychiatrist, informe a nuestra oficina a travs de MyChart de  o por telfono llamando al [phone] y presione la opcin 4.

## 2023-10-29 NOTE — Progress Notes (Signed)
 Follow-Up Visit   Subjective  Rebecca Watkins is a 68 y.o. female who presents for the following: Skin Cancer Screening and Full Body Skin Exam Hx of bcc, hx of isks  Patient reports she notice a spot at left forehead a few weeks ago she would like checked.   The patient presents for Total-Body Skin Exam (TBSE) for skin cancer screening and mole check. The patient has spots, moles and lesions to be evaluated, some may be new or changing and the patient may have concern these could be cancer.  The following portions of the chart were reviewed this encounter and updated as appropriate: medications, allergies, medical history  Review of Systems:  No other skin or systemic complaints except as noted in HPI or Assessment and Plan.  Objective  Well appearing patient in no apparent distress; mood and affect are within normal limits.  A full examination was performed including scalp, head, eyes, ears, nose, lips, neck, chest, axillae, abdomen, back, buttocks, bilateral upper extremities, bilateral lower extremities, hands, feet, fingers, toes, fingernails, and toenails. All findings within normal limits unless otherwise noted below.   Relevant physical exam findings are noted in the Assessment and Plan.  left forehead above brow x 1, right forearm x 1 (2) Erythematous stuck-on, waxy papule or plaque right superior forehead 0.6 cm flesh colored flat papule    Assessment & Plan   SKIN CANCER SCREENING PERFORMED TODAY.  ACTINIC DAMAGE - Chronic condition, secondary to cumulative UV/sun exposure - diffuse scaly erythematous macules with underlying dyspigmentation - Recommend daily broad spectrum sunscreen SPF 30+ to sun-exposed areas, reapply every 2 hours as needed.  - Staying in the shade or wearing long sleeves, sun glasses (UVA+UVB protection) and wide brim hats (4-inch brim around the entire circumference of the hat) are also recommended for sun protection.  - Call for new or changing  lesions.  LENTIGINES, SEBORRHEIC KERATOSES, HEMANGIOMAS - Benign normal skin lesions - Benign-appearing - Call for any changes  MELANOCYTIC NEVI - Tan-brown and/or pink-flesh-colored symmetric macules and papules - Benign appearing on exam today - Observation - Call clinic for new or changing moles - Recommend daily use of broad spectrum spf 30+ sunscreen to sun-exposed areas.   Xerosis - diffuse xerotic patches - recommend gentle, hydrating skin care - gentle skin care handout given Recommend Cerave Moisturizer Cream  Give samples   History of Basal Cell Carcinoma of the Skin Left upper back - 04/29/2013 - No evidence of recurrence today - Recommend regular full body skin exams - Recommend daily broad spectrum sunscreen SPF 30+ to sun-exposed areas, reapply every 2 hours as needed.  - Call if any new or changing lesions are noted between office visits  INFLAMED SEBORRHEIC KERATOSIS (2) left forehead above brow x 1, right forearm x 1 (2) Symptomatic, irritating, patient would like treated.  Patient advised if locations treated today are not resolved in 2 months, contact us and will recheck areas  Destruction of lesion - left forehead above brow x 1, right forearm x 1 (2) Complexity: simple   Destruction method: cryotherapy   Informed consent: discussed and consent obtained   Timeout:  patient name, date of birth, surgical site, and procedure verified Lesion destroyed using liquid nitrogen: Yes   Region frozen until ice ball extended beyond lesion: Yes   Outcome: patient tolerated procedure well with no complications   Post-procedure details: wound care instructions given   NEOPLASM OF UNCERTAIN BEHAVIOR right superior forehead Epidermal / dermal shaving  Lesion  diameter (cm):  0.6 Informed consent: discussed and consent obtained   Timeout: patient name, date of birth, surgical site, and procedure verified   Procedure prep:  Patient was prepped and draped in usual  sterile fashion Prep type:  Isopropyl alcohol Anesthesia: the lesion was anesthetized in a standard fashion   Anesthetic:  1% lidocaine w/ epinephrine 1-100,000 buffered w/ 8.4% NaHCO3 Instrument used: flexible razor blade   Hemostasis achieved with: pressure, aluminum chloride and electrodesiccation   Outcome: patient tolerated procedure well   Post-procedure details: sterile dressing applied and wound care instructions given   Dressing type: bandage and petrolatum    Destruction of lesion Complexity: extensive   Destruction method: electrodesiccation and curettage   Informed consent: discussed and consent obtained   Timeout:  patient name, date of birth, surgical site, and procedure verified Procedure prep:  Patient was prepped and draped in usual sterile fashion Prep type:  Isopropyl alcohol Anesthesia: the lesion was anesthetized in a standard fashion   Anesthetic:  1% lidocaine w/ epinephrine 1-100,000 buffered w/ 8.4% NaHCO3 Curettage performed in three different directions: Yes   Electrodesiccation performed over the curetted area: Yes   Lesion length (cm):  0.6 Lesion width (cm):  0.6 Margin per side (cm):  0.2 Final wound size (cm):  1 Hemostasis achieved with:  pressure, aluminum chloride and electrodesiccation Outcome: patient tolerated procedure well with no complications   Post-procedure details: sterile dressing applied and wound care instructions given   Dressing type: bandage and petrolatum   Specimen 1 - Surgical pathology Differential Diagnosis: r/o bcc vs other  Check Margins: No  ED&C today  R/o bcc vs other  ED&C today  SKIN CANCER SCREENING   ACTINIC SKIN DAMAGE   LENTIGO   MELANOCYTIC NEVUS, UNSPECIFIED LOCATION   HISTORY OF BASAL CELL CARCINOMA   SEBORRHEIC KERATOSIS   XEROSIS CUTIS   Return in about 1 year (around 10/28/2024) for TBSE.  IAsher Muir, CMA, am acting as scribe for Armida Sans, MD.   Documentation: I have  reviewed the above documentation for accuracy and completeness, and I agree with the above.  Armida Sans, MD

## 2023-10-31 LAB — SURGICAL PATHOLOGY

## 2023-11-03 ENCOUNTER — Telehealth: Payer: Self-pay

## 2023-11-03 ENCOUNTER — Encounter: Payer: Self-pay | Admitting: Dermatology

## 2023-11-03 NOTE — Telephone Encounter (Addendum)
 Called and discussed bx results with patient. Patient was concerned may need additional treatment. Discussed procedure has a 98 % cure rate and did not need additional treatment. Also advised patient if she had any other spots or anything changed that she was concerned about she would be seen before her next scheduled follow up.    ----- Message from Armida Sans sent at 11/03/2023 12:07 PM EDT ----- FINAL DIAGNOSIS        1. Skin, right superior forehead :       BASAL CELL CARCINOMA, NODULAR PATTERN    Cancer = BCC Already treated Recheck next visit

## 2023-11-04 DIAGNOSIS — M2041 Other hammer toe(s) (acquired), right foot: Secondary | ICD-10-CM | POA: Diagnosis not present

## 2023-11-13 ENCOUNTER — Other Ambulatory Visit: Payer: Self-pay

## 2023-11-14 ENCOUNTER — Other Ambulatory Visit: Payer: Self-pay

## 2023-11-14 MED ORDER — GABAPENTIN 300 MG PO CAPS
300.0000 mg | ORAL_CAPSULE | Freq: Every evening | ORAL | 3 refills | Status: AC
  Filled 2023-11-14: qty 90, 90d supply, fill #0
  Filled 2024-02-18: qty 90, 90d supply, fill #1
  Filled 2024-05-21: qty 90, 90d supply, fill #2
  Filled 2024-08-23: qty 90, 90d supply, fill #3

## 2024-01-06 ENCOUNTER — Other Ambulatory Visit: Payer: Self-pay

## 2024-02-18 ENCOUNTER — Other Ambulatory Visit: Payer: Self-pay

## 2024-04-27 ENCOUNTER — Other Ambulatory Visit: Payer: Self-pay

## 2024-04-27 DIAGNOSIS — R296 Repeated falls: Secondary | ICD-10-CM | POA: Diagnosis not present

## 2024-04-27 DIAGNOSIS — Z7189 Other specified counseling: Secondary | ICD-10-CM | POA: Diagnosis not present

## 2024-04-27 DIAGNOSIS — R29898 Other symptoms and signs involving the musculoskeletal system: Secondary | ICD-10-CM | POA: Diagnosis not present

## 2024-04-27 DIAGNOSIS — G629 Polyneuropathy, unspecified: Secondary | ICD-10-CM | POA: Diagnosis not present

## 2024-04-27 MED ORDER — PFIZER COVID-19 VAC BIVALENT 30 MCG/0.3ML IM SUSP: INTRAMUSCULAR | 0 refills | Status: AC

## 2024-05-02 ENCOUNTER — Other Ambulatory Visit: Payer: Self-pay

## 2024-07-26 ENCOUNTER — Other Ambulatory Visit: Payer: Self-pay

## 2024-07-26 DIAGNOSIS — M85859 Other specified disorders of bone density and structure, unspecified thigh: Secondary | ICD-10-CM | POA: Diagnosis not present

## 2024-07-26 MED ORDER — NITROFURANTOIN MONOHYD MACRO 100 MG PO CAPS
100.0000 mg | ORAL_CAPSULE | Freq: Two times a day (BID) | ORAL | 0 refills | Status: AC
  Filled 2024-07-26: qty 10, 5d supply, fill #0

## 2024-07-27 ENCOUNTER — Other Ambulatory Visit: Payer: Self-pay | Admitting: Obstetrics and Gynecology

## 2024-07-27 DIAGNOSIS — N644 Mastodynia: Secondary | ICD-10-CM | POA: Diagnosis not present

## 2024-07-27 DIAGNOSIS — Z01411 Encounter for gynecological examination (general) (routine) with abnormal findings: Secondary | ICD-10-CM | POA: Diagnosis not present

## 2024-07-27 DIAGNOSIS — Z1331 Encounter for screening for depression: Secondary | ICD-10-CM | POA: Diagnosis not present

## 2024-07-28 ENCOUNTER — Other Ambulatory Visit: Payer: Self-pay

## 2024-07-28 MED ORDER — CONJ ESTROG-MEDROXYPROGEST ACE 0.45-1.5 MG PO TABS
1.0000 | ORAL_TABLET | Freq: Every day | ORAL | 6 refills | Status: AC
  Filled 2024-07-28: qty 30, 30d supply, fill #0
  Filled 2024-07-29: qty 28, 28d supply, fill #0
  Filled 2024-08-26: qty 28, 28d supply, fill #1

## 2024-07-29 ENCOUNTER — Other Ambulatory Visit: Payer: Self-pay

## 2024-08-09 ENCOUNTER — Other Ambulatory Visit: Payer: Self-pay | Admitting: Obstetrics and Gynecology

## 2024-08-09 ENCOUNTER — Ambulatory Visit
Admission: RE | Admit: 2024-08-09 | Discharge: 2024-08-09 | Disposition: A | Discharge status: Home / Self Care | Source: Ambulatory Visit | Attending: Obstetrics and Gynecology | Admitting: Obstetrics and Gynecology

## 2024-08-09 DIAGNOSIS — N644 Mastodynia: Secondary | ICD-10-CM | POA: Diagnosis present

## 2024-08-26 ENCOUNTER — Other Ambulatory Visit: Payer: Self-pay

## 2024-08-27 ENCOUNTER — Other Ambulatory Visit: Payer: Self-pay

## 2024-10-28 ENCOUNTER — Ambulatory Visit: Admitting: Dermatology
